# Patient Record
Sex: Female | Born: 1950 | Hispanic: No | Smoking: Former smoker
Health system: Southern US, Community
[De-identification: ages and names within clinical notes are randomized; demographics above are authoritative.]

## PROBLEM LIST (undated history)

## (undated) DIAGNOSIS — N289 Disorder of kidney and ureter, unspecified: Secondary | ICD-10-CM

## (undated) DIAGNOSIS — G4733 Obstructive sleep apnea (adult) (pediatric): Secondary | ICD-10-CM

## (undated) DIAGNOSIS — G8929 Other chronic pain: Secondary | ICD-10-CM

## (undated) DIAGNOSIS — I10 Essential (primary) hypertension: Secondary | ICD-10-CM

## (undated) DIAGNOSIS — Z9989 Dependence on other enabling machines and devices: Secondary | ICD-10-CM

## (undated) DIAGNOSIS — E669 Obesity, unspecified: Secondary | ICD-10-CM

## (undated) DIAGNOSIS — M549 Dorsalgia, unspecified: Secondary | ICD-10-CM

## (undated) DIAGNOSIS — E079 Disorder of thyroid, unspecified: Secondary | ICD-10-CM

## (undated) DIAGNOSIS — R609 Edema, unspecified: Secondary | ICD-10-CM

## (undated) DIAGNOSIS — E7800 Pure hypercholesterolemia, unspecified: Secondary | ICD-10-CM

## (undated) DIAGNOSIS — I509 Heart failure, unspecified: Secondary | ICD-10-CM

## (undated) DIAGNOSIS — I639 Cerebral infarction, unspecified: Secondary | ICD-10-CM

## (undated) HISTORY — PX: OTHER SURGICAL HISTORY: SHX169

## (undated) HISTORY — PX: ABDOMINAL HYSTERECTOMY: SHX81

## (undated) HISTORY — DX: Obstructive sleep apnea (adult) (pediatric): G47.33

## (undated) HISTORY — PX: EYE SURGERY: SHX253

## (undated) HISTORY — DX: Obesity, unspecified: E66.9

## (undated) HISTORY — DX: Dependence on other enabling machines and devices: Z99.89

## (undated) HISTORY — PX: NEPHROLITHOTOMY: SUR881

## (undated) HISTORY — PX: BUNIONECTOMY: SHX129

## (undated) HISTORY — DX: Cerebral infarction, unspecified: I63.9

## (undated) HISTORY — DX: Edema, unspecified: R60.9

---

## 1998-05-08 ENCOUNTER — Encounter: Payer: Self-pay | Admitting: Urology

## 1998-05-08 ENCOUNTER — Ambulatory Visit (HOSPITAL_COMMUNITY): Admission: RE | Admit: 1998-05-08 | Discharge: 1998-05-08 | Payer: Self-pay | Admitting: Urology

## 1998-11-08 ENCOUNTER — Encounter: Admission: RE | Admit: 1998-11-08 | Discharge: 1999-02-06 | Payer: Self-pay | Admitting: Anesthesiology

## 2000-02-16 ENCOUNTER — Encounter: Admission: RE | Admit: 2000-02-16 | Discharge: 2000-04-09 | Payer: Self-pay | Admitting: Anesthesiology

## 2000-06-03 ENCOUNTER — Ambulatory Visit (HOSPITAL_COMMUNITY): Admission: RE | Admit: 2000-06-03 | Discharge: 2000-06-03 | Payer: Self-pay | Admitting: Internal Medicine

## 2000-06-03 ENCOUNTER — Encounter (HOSPITAL_BASED_OUTPATIENT_CLINIC_OR_DEPARTMENT_OTHER): Payer: Self-pay | Admitting: Internal Medicine

## 2000-06-08 ENCOUNTER — Other Ambulatory Visit: Admission: RE | Admit: 2000-06-08 | Discharge: 2000-06-08 | Payer: Self-pay | Admitting: Internal Medicine

## 2000-12-23 ENCOUNTER — Encounter: Admission: RE | Admit: 2000-12-23 | Discharge: 2001-02-23 | Payer: Self-pay | Admitting: Internal Medicine

## 2001-10-19 ENCOUNTER — Ambulatory Visit (HOSPITAL_COMMUNITY): Admission: RE | Admit: 2001-10-19 | Discharge: 2001-10-19 | Payer: Self-pay | Admitting: Internal Medicine

## 2001-10-19 ENCOUNTER — Encounter: Payer: Self-pay | Admitting: Internal Medicine

## 2002-09-14 ENCOUNTER — Ambulatory Visit (HOSPITAL_BASED_OUTPATIENT_CLINIC_OR_DEPARTMENT_OTHER): Admission: RE | Admit: 2002-09-14 | Discharge: 2002-09-14 | Payer: Self-pay | Admitting: Orthopedic Surgery

## 2005-08-22 ENCOUNTER — Emergency Department (HOSPITAL_COMMUNITY): Admission: EM | Admit: 2005-08-22 | Discharge: 2005-08-22 | Payer: Self-pay | Admitting: Emergency Medicine

## 2006-10-08 ENCOUNTER — Ambulatory Visit (HOSPITAL_COMMUNITY): Admission: RE | Admit: 2006-10-08 | Discharge: 2006-10-08 | Payer: Self-pay | Admitting: Internal Medicine

## 2006-10-08 ENCOUNTER — Ambulatory Visit: Payer: Self-pay | Admitting: *Deleted

## 2011-07-22 ENCOUNTER — Other Ambulatory Visit (HOSPITAL_COMMUNITY): Payer: Self-pay | Admitting: Internal Medicine

## 2011-07-22 DIAGNOSIS — Z1231 Encounter for screening mammogram for malignant neoplasm of breast: Secondary | ICD-10-CM

## 2011-07-23 ENCOUNTER — Ambulatory Visit (HOSPITAL_COMMUNITY)
Admission: RE | Admit: 2011-07-23 | Discharge: 2011-07-23 | Disposition: A | Discharge status: Home / Self Care | Payer: BC Managed Care – PPO | Source: Ambulatory Visit | Attending: Internal Medicine | Admitting: Internal Medicine

## 2011-07-23 DIAGNOSIS — Z1231 Encounter for screening mammogram for malignant neoplasm of breast: Secondary | ICD-10-CM

## 2011-09-05 ENCOUNTER — Encounter (HOSPITAL_COMMUNITY): Payer: Self-pay | Admitting: *Deleted

## 2011-09-05 ENCOUNTER — Emergency Department (HOSPITAL_COMMUNITY)
Admission: EM | Admit: 2011-09-05 | Discharge: 2011-09-05 | Disposition: A | Discharge status: Home / Self Care | Payer: BC Managed Care – PPO | Attending: Emergency Medicine | Admitting: Emergency Medicine

## 2011-09-05 ENCOUNTER — Emergency Department (HOSPITAL_COMMUNITY): Payer: BC Managed Care – PPO

## 2011-09-05 DIAGNOSIS — I10 Essential (primary) hypertension: Secondary | ICD-10-CM | POA: Insufficient documentation

## 2011-09-05 DIAGNOSIS — E7800 Pure hypercholesterolemia, unspecified: Secondary | ICD-10-CM | POA: Insufficient documentation

## 2011-09-05 DIAGNOSIS — M549 Dorsalgia, unspecified: Secondary | ICD-10-CM | POA: Insufficient documentation

## 2011-09-05 DIAGNOSIS — M25569 Pain in unspecified knee: Secondary | ICD-10-CM | POA: Insufficient documentation

## 2011-09-05 DIAGNOSIS — M25562 Pain in left knee: Secondary | ICD-10-CM

## 2011-09-05 DIAGNOSIS — I509 Heart failure, unspecified: Secondary | ICD-10-CM | POA: Insufficient documentation

## 2011-09-05 DIAGNOSIS — H6690 Otitis media, unspecified, unspecified ear: Secondary | ICD-10-CM | POA: Insufficient documentation

## 2011-09-05 DIAGNOSIS — M79609 Pain in unspecified limb: Secondary | ICD-10-CM | POA: Insufficient documentation

## 2011-09-05 DIAGNOSIS — Z79899 Other long term (current) drug therapy: Secondary | ICD-10-CM | POA: Insufficient documentation

## 2011-09-05 DIAGNOSIS — E119 Type 2 diabetes mellitus without complications: Secondary | ICD-10-CM | POA: Insufficient documentation

## 2011-09-05 HISTORY — DX: Pure hypercholesterolemia, unspecified: E78.00

## 2011-09-05 HISTORY — DX: Disorder of thyroid, unspecified: E07.9

## 2011-09-05 HISTORY — DX: Dorsalgia, unspecified: M54.9

## 2011-09-05 HISTORY — DX: Heart failure, unspecified: I50.9

## 2011-09-05 HISTORY — DX: Essential (primary) hypertension: I10

## 2011-09-05 HISTORY — DX: Disorder of kidney and ureter, unspecified: N28.9

## 2011-09-05 HISTORY — DX: Other chronic pain: G89.29

## 2011-09-05 MED ORDER — HYDROCODONE-ACETAMINOPHEN 5-325 MG PO TABS
1.0000 | ORAL_TABLET | Freq: Once | ORAL | Status: AC
  Administered 2011-09-05: 1 via ORAL
  Filled 2011-09-05: qty 1

## 2011-09-05 MED ORDER — HYDROCODONE-ACETAMINOPHEN 5-325 MG PO TABS: 1.0000 | ORAL_TABLET | ORAL | Status: AC | PRN

## 2011-09-05 NOTE — ED Provider Notes (Signed)
History     CSN: [id number]  Arrival date & time 09/05/11  1204   First MD Initiated Contact with Patient 09/05/11 1214      Chief Complaint  Patient presents with  . Knee Pain    Left    (Consider location/radiation/quality/duration/timing/severity/associated sxs/prior treatment) HPI Comments: Patient reports she has had left knee pain intermittently x several months that is progressively getting worse, has been constant for the past day.  States that the pain is worst right when she stands up from a seated position.  The pain is aching and throbbing, and the knee feels like it "catches."  Pain radiates into her left thigh.  Has been taking naproxen and mobic at home without improvement.  Denies fevers, injury to the knee, swelling of the knee or legs.  Hx torn meniscus with surgical repair by Dr Chaney Malling in 2004.    The history is provided by the patient.    Past Medical History  Diagnosis Date  . CHF (congestive heart failure)   . Hypertension   . High cholesterol   . Thyroid disease   . Diabetes mellitus   . Back pain, chronic   . Renal disorder     Past Surgical History  Procedure Date  . Nephrolithotomy     Right  . Abdominal hysterectomy   . Eye surgery   . Bunionectomy     both feet  . Left knee surgery     Torn meniscus    History reviewed. No pertinent family history.  History  Substance Use Topics  . Smoking status: Never Smoker   . Smokeless tobacco: Never Used  . Alcohol Use: No    OB History    Grav Para Term Preterm Abortions TAB SAB Ect Mult Living                  Review of Systems  All other systems reviewed and are negative.    Allergies  Lipitor  Home Medications   Current Outpatient Rx  Name Route Sig Dispense Refill  . ALLOPURINOL 300 MG PO TABS Oral Take 300 mg by mouth daily.    Marland Kitchen BACLOFEN 10 MG PO TABS Oral Take 10 mg by mouth daily.    . BUPROPION HCL ER (SR) 100 MG PO TB12 Oral Take 100 mg by mouth 3 (three) times  daily.    . COLESEVELAM HCL 625 MG PO TABS Oral Take 1,875 mg by mouth 2 (two) times daily with a meal.    . CYCLOSPORINE 0.05 % OP EMUL Both Eyes Place 1 drop into both eyes 2 (two) times daily.    Marland Kitchen EXENATIDE 10 MCG/0.04ML [place] SOLN Subcutaneous Inject 10 mcg into the skin 2 (two) times daily with a meal.    . EZETIMIBE 10 MG PO TABS Oral Take 10 mg by mouth daily.    . FUROSEMIDE 40 MG PO TABS Oral Take 120 mg by mouth daily.    Marland Kitchen GABAPENTIN 600 MG PO TABS Oral Take 600 mg by mouth at bedtime.    . GLYBURIDE 5 MG PO TABS Oral Take 5 mg by mouth daily with supper.    Marland Kitchen LEVOTHYROXINE SODIUM 75 MCG PO TABS Oral Take 75 mcg by mouth daily.    . MELOXICAM 15 MG PO TABS Oral Take 15 mg by mouth daily.    . METHYLPHENIDATE HCL ER 36 MG PO TBCR Oral Take 36 mg by mouth every morning.    Marland Kitchen METOLAZONE 2.5 MG PO TABS  Oral Take 2.5 mg by mouth 3 (three) times a week.    . NEBIVOLOL HCL 5 MG PO TABS Oral Take 5 mg by mouth daily.    Marland Kitchen PANTOPRAZOLE SODIUM 40 MG PO TBEC Oral Take 40 mg by mouth daily.    Marland Kitchen POTASSIUM CHLORIDE CRYS ER 20 MEQ PO TBCR Oral Take 20 mEq by mouth 2 (two) times daily. 4 times daily for 4 days of week and 5 times daily for 3 days    . SPIRONOLACTONE 50 MG PO TABS Oral Take 50 mg by mouth daily.    Marland Kitchen VITAMIN D (ERGOCALCIFEROL) 50000 UNITS PO CAPS Oral Take 50,000 Units by mouth 2 (two) times a week.      BP 119/56  Pulse 79  Temp(Src) 98.3 F (36.8 C) (Oral)  Resp 18  Wt 245 lb (111.131 kg)  SpO2 98%  Physical Exam  Constitutional: She is oriented to person, place, and time. She appears well-developed and well-nourished.  HENT:  Head: Normocephalic and atraumatic.  Neck: Neck supple.  Pulmonary/Chest: Effort normal.  Musculoskeletal:       Left knee: She exhibits LCL laxity. She exhibits no swelling, no ecchymosis, no deformity and no erythema.       Pain with varus stress of knee, pain with grind test.    Neurological: She is alert and oriented to person, place, and  time.    ED Course  Procedures (including critical care time)  Labs Reviewed - No data to display Dg Knee Complete 4 Views Left  09/05/2011  *RADIOLOGY REPORT*  Clinical Data: Left knee pain, no recent injury  LEFT KNEE - COMPLETE 4+ VIEW  Comparison: None.  Findings: Mild to moderate degenerative changes, most prominent in the medial compartment.  No fracture or dislocation is seen.  The visualized soft tissues are unremarkable.  No suprapatellar knee joint effusion.  IMPRESSION: Mild to moderate degenerative changes, most prominent in the medial compartment.  Original Report Authenticated By: Charline Bills, M.D.     1. Pain in left knee       MDM  Patient with history of torn meniscus and left knee presents with several months of left knee pain.  That was previously intermittent and is now constant.  Patient had no known injury.  On x-ray.  Patient does have mild to moderate degenerative change.  On my exam.  It appears she may have some meniscal injury, possibly, lateral collateral ligament injury.  There is no evidence of inflammatory or infectious changes.  The knee is not erythematous, edematous, or warm.  The patient does not have fever.  Given the duration of the symptoms and no known injury, this appears to be a chronic issue and not an acute injury.  Patient was discharged home with pain medication and orthopedic followup.  Patient verbalizes understanding and agrees with plan. Rise Patience, [place] 09/05/11 1439

## 2011-09-05 NOTE — ED Notes (Signed)
Pt from home c/o left knee pain off and on for 3 weeks but pain worsened yesterday to the point that pt is unable to bear weight on knee. Pt denies recent injury, has hx of torn meniscus in 2004 (Dr. Chaney Malling).

## 2011-09-05 NOTE — ED Notes (Signed)
PA at bedside.

## 2011-09-05 NOTE — ED Provider Notes (Signed)
Medical screening examination/treatment/procedure(s) were conducted as a shared visit with non-physician practitioner(s) and myself.  I personally evaluated the patient during the encounter   Shaquasha Gerstel, MD 09/05/11 1544 

## 2011-12-22 ENCOUNTER — Other Ambulatory Visit: Payer: Self-pay | Admitting: Cardiovascular Disease

## 2011-12-22 ENCOUNTER — Ambulatory Visit
Admission: RE | Admit: 2011-12-22 | Discharge: 2011-12-22 | Disposition: A | Discharge status: Home / Self Care | Payer: BC Managed Care – PPO | Source: Ambulatory Visit | Attending: Cardiovascular Disease | Admitting: Cardiovascular Disease

## 2011-12-22 DIAGNOSIS — R0602 Shortness of breath: Secondary | ICD-10-CM

## 2011-12-24 HISTORY — PX: US ECHOCARDIOGRAPHY: HXRAD669

## 2012-11-29 ENCOUNTER — Other Ambulatory Visit (HOSPITAL_COMMUNITY): Payer: Self-pay | Admitting: Cardiovascular Disease

## 2012-11-29 DIAGNOSIS — R0602 Shortness of breath: Secondary | ICD-10-CM

## 2012-12-02 ENCOUNTER — Ambulatory Visit (HOSPITAL_COMMUNITY)
Admission: RE | Admit: 2012-12-02 | Discharge: 2012-12-02 | Disposition: A | Discharge status: Home / Self Care | Payer: BC Managed Care – PPO | Source: Ambulatory Visit | Attending: Cardiovascular Disease | Admitting: Cardiovascular Disease

## 2012-12-02 DIAGNOSIS — I10 Essential (primary) hypertension: Secondary | ICD-10-CM | POA: Insufficient documentation

## 2012-12-02 DIAGNOSIS — R0602 Shortness of breath: Secondary | ICD-10-CM | POA: Insufficient documentation

## 2012-12-02 DIAGNOSIS — R5381 Other malaise: Secondary | ICD-10-CM | POA: Insufficient documentation

## 2012-12-02 DIAGNOSIS — Z87891 Personal history of nicotine dependence: Secondary | ICD-10-CM | POA: Insufficient documentation

## 2012-12-02 DIAGNOSIS — E119 Type 2 diabetes mellitus without complications: Secondary | ICD-10-CM | POA: Insufficient documentation

## 2012-12-02 DIAGNOSIS — R079 Chest pain, unspecified: Secondary | ICD-10-CM | POA: Insufficient documentation

## 2012-12-02 DIAGNOSIS — R42 Dizziness and giddiness: Secondary | ICD-10-CM | POA: Insufficient documentation

## 2012-12-02 DIAGNOSIS — Z8249 Family history of ischemic heart disease and other diseases of the circulatory system: Secondary | ICD-10-CM | POA: Insufficient documentation

## 2012-12-02 DIAGNOSIS — R0609 Other forms of dyspnea: Secondary | ICD-10-CM | POA: Insufficient documentation

## 2012-12-02 DIAGNOSIS — R0989 Other specified symptoms and signs involving the circulatory and respiratory systems: Secondary | ICD-10-CM | POA: Insufficient documentation

## 2012-12-02 DIAGNOSIS — R5383 Other fatigue: Secondary | ICD-10-CM | POA: Insufficient documentation

## 2012-12-02 MED ORDER — REGADENOSON 0.4 MG/5ML IV SOLN
0.4000 mg | Freq: Once | INTRAVENOUS | Status: AC
  Administered 2012-12-02: 0.4 mg via INTRAVENOUS

## 2012-12-02 MED ORDER — TECHNETIUM TC 99M SESTAMIBI GENERIC - CARDIOLITE
30.0000 | Freq: Once | INTRAVENOUS | Status: AC | PRN
  Administered 2012-12-02: 30 via INTRAVENOUS

## 2012-12-02 MED ORDER — TECHNETIUM TC 99M SESTAMIBI GENERIC - CARDIOLITE
10.0000 | Freq: Once | INTRAVENOUS | Status: AC | PRN
  Administered 2012-12-02: 10 via INTRAVENOUS

## 2012-12-02 NOTE — Procedures (Addendum)
[facility] [address] [place] [place] [address] [phone]  Cardiology Nuclear Med Study  Bradyn Arbuthnot is a 62 y.o. female     MRN : [id number]     DOB: 1950/07/31  Procedure Date: 12/02/2012  Nuclear Med Background Indication for Stress Test:  Evaluation for Ischemia History:  CHF Cardiac Risk Factors: Family History - CAD, History of Smoking, Hypertension, Lipids and NIDDM  Symptoms:  Chest Pain, DOE, Fatigue, Light-Headedness and SOB   Nuclear Pre-Procedure Caffeine/Decaff Intake:  8:00pm NPO After: 6:00am   IV Site: R Forearm  IV 0.9% NS with Angio Cath:  22g  Chest Size (in):  N/A IV Started by: Emmit Pomfret, RN  Height: 5\' 5"  (1.651 m)  Cup Size: D  BMI:  Body mass index is 41.1 kg/(m^2). Weight:  247 lb (112.038 kg)   Tech Comments:  N/A    Nuclear Med Study 1 or 2 day study: 1 day  Stress Test Type:  Lexiscan  Order Authorizing Provider:  Thurmon Fair, MD   Resting Radionuclide: Technetium 35m Sestamibi  Resting Radionuclide Dose: 10.0 mCi   Stress Radionuclide:  Technetium 90m Sestamibi  Stress Radionuclide Dose: 30.0 mCi           Stress Protocol Rest HR: 58 Stress HR: 85  Rest BP: 139/67 Stress BP: 141/54  Exercise Time (min): n/a METS: n/a   Predicted Max HR: 161 bpm % Max HR: 52.8 bpm Rate Pressure Product: 21308  Dose of Adenosine (mg):  n/a Dose of Lexiscan: 0.4 mg  Dose of Atropine (mg): n/a Dose of Dobutamine: n/a mcg/kg/min (at max HR)  Stress Test Technologist: Esperanza Sheets, CCT Nuclear Technologist: Koren Shiver, CNMT   Rest Procedure:  Myocardial perfusion imaging was performed at rest 45 minutes following the intravenous administration of Technetium 59m Sestamibi. Stress Procedure:  The patient received IV Lexiscan 0.4 mg over 15-seconds.  Technetium 39m Sestamibi injected at 30-seconds.  There were no significant changes with Lexiscan.  Quantitative spect images were obtained  after a 45 minute delay.  Transient Ischemic Dilatation (Normal <1.22):  1.05 Lung/Heart Ratio (Normal <0.45):  0.53 QGS EDV:  49 ml QGS ESV:  13 ml LV Ejection Fraction: 73%  Signed by      Rest ECG: NSR - Normal EKG  Stress ECG: No significant change from baseline ECG  QPS Raw Data Images:  There is no interference from nuclear activity from structures below the diaphragm.   Stress Images:  There is decreased uptake in the anterior wall. Rest Images:  There is decreased uptake in the anterior wall. Subtraction (SDS):  No evidence of ischemia.  Impression Exercise Capacity:  Lexiscan with no exercise. BP Response:  Normal blood pressure response. Clinical Symptoms:  No significant symptoms noted. ECG Impression:  No significant ST segment change suggestive of ischemia. Comparison with Prior Nuclear Study: No previous nuclear study performed  Overall Impression:  Low risk stress nuclear study with breast attenuation artifact.  LV Wall Motion:  NL LV Function; NL Wall Motion   Runell Gess, MD  12/02/2012 5:19 PM

## 2012-12-06 ENCOUNTER — Telehealth: Payer: Self-pay | Admitting: *Deleted

## 2012-12-06 NOTE — Telephone Encounter (Signed)
Pt. returned call verifying test was normal. Requested copy of Nuc  to be mailed.

## 2012-12-06 NOTE — Telephone Encounter (Signed)
LMOM - notified of normal Myoview

## 2012-12-06 NOTE — Telephone Encounter (Signed)
Message copied by Vita Barley on Tue Dec 06, 2012  1:59 PM ------      Message from: Thurmon Fair      Created: Sat Dec 03, 2012  8:25 AM       Normal study, please let the patient know.       ------

## 2012-12-12 ENCOUNTER — Telehealth: Payer: Self-pay | Admitting: *Deleted

## 2012-12-12 NOTE — Telephone Encounter (Signed)
LMOM at work # with normal nuc results

## 2012-12-18 ENCOUNTER — Encounter: Payer: Self-pay | Admitting: *Deleted

## 2013-01-10 ENCOUNTER — Encounter: Payer: Self-pay | Admitting: Cardiovascular Disease

## 2013-10-22 ENCOUNTER — Emergency Department (HOSPITAL_COMMUNITY): Payer: BC Managed Care – PPO

## 2013-10-22 ENCOUNTER — Encounter (HOSPITAL_COMMUNITY): Payer: Self-pay | Admitting: Emergency Medicine

## 2013-10-22 ENCOUNTER — Emergency Department (HOSPITAL_COMMUNITY)
Admission: EM | Admit: 2013-10-22 | Discharge: 2013-10-23 | Disposition: A | Discharge status: Home / Self Care | Payer: BC Managed Care – PPO | Attending: Emergency Medicine | Admitting: Emergency Medicine

## 2013-10-22 DIAGNOSIS — I10 Essential (primary) hypertension: Secondary | ICD-10-CM | POA: Insufficient documentation

## 2013-10-22 DIAGNOSIS — Z9981 Dependence on supplemental oxygen: Secondary | ICD-10-CM | POA: Insufficient documentation

## 2013-10-22 DIAGNOSIS — E7800 Pure hypercholesterolemia, unspecified: Secondary | ICD-10-CM | POA: Insufficient documentation

## 2013-10-22 DIAGNOSIS — Z791 Long term (current) use of non-steroidal anti-inflammatories (NSAID): Secondary | ICD-10-CM | POA: Insufficient documentation

## 2013-10-22 DIAGNOSIS — I509 Heart failure, unspecified: Secondary | ICD-10-CM | POA: Insufficient documentation

## 2013-10-22 DIAGNOSIS — Z87891 Personal history of nicotine dependence: Secondary | ICD-10-CM | POA: Insufficient documentation

## 2013-10-22 DIAGNOSIS — Z90710 Acquired absence of both cervix and uterus: Secondary | ICD-10-CM | POA: Insufficient documentation

## 2013-10-22 DIAGNOSIS — Z87442 Personal history of urinary calculi: Secondary | ICD-10-CM | POA: Insufficient documentation

## 2013-10-22 DIAGNOSIS — G4733 Obstructive sleep apnea (adult) (pediatric): Secondary | ICD-10-CM | POA: Insufficient documentation

## 2013-10-22 DIAGNOSIS — R609 Edema, unspecified: Secondary | ICD-10-CM | POA: Insufficient documentation

## 2013-10-22 DIAGNOSIS — E079 Disorder of thyroid, unspecified: Secondary | ICD-10-CM | POA: Insufficient documentation

## 2013-10-22 DIAGNOSIS — N83209 Unspecified ovarian cyst, unspecified side: Secondary | ICD-10-CM

## 2013-10-22 DIAGNOSIS — G8929 Other chronic pain: Secondary | ICD-10-CM | POA: Insufficient documentation

## 2013-10-22 DIAGNOSIS — E669 Obesity, unspecified: Secondary | ICD-10-CM | POA: Insufficient documentation

## 2013-10-22 DIAGNOSIS — Z79899 Other long term (current) drug therapy: Secondary | ICD-10-CM | POA: Insufficient documentation

## 2013-10-22 DIAGNOSIS — E119 Type 2 diabetes mellitus without complications: Secondary | ICD-10-CM | POA: Insufficient documentation

## 2013-10-22 LAB — BASIC METABOLIC PANEL
BUN: 17 mg/dL (ref 6–23)
CALCIUM: 9.8 mg/dL (ref 8.4–10.5)
CO2: 25 mEq/L (ref 19–32)
CREATININE: 1.17 mg/dL — AB (ref 0.50–1.10)
Chloride: 96 mEq/L (ref 96–112)
GFR, EST AFRICAN AMERICAN: 57 mL/min — AB (ref >90)
GFR, EST NON AFRICAN AMERICAN: 50 mL/min — AB (ref >90)
Glucose, Bld: 357 mg/dL — ABNORMAL HIGH (ref 70–99)
Potassium: 4.8 mEq/L (ref 3.7–5.3)
Sodium: 135 mEq/L — ABNORMAL LOW (ref 137–147)

## 2013-10-22 LAB — URINALYSIS, ROUTINE W REFLEX MICROSCOPIC
Bilirubin Urine: NEGATIVE
Glucose, UA: >1000 mg/dL — AB
Hgb urine dipstick: NEGATIVE
KETONES UR: NEGATIVE mg/dL
LEUKOCYTES UA: NEGATIVE
NITRITE: NEGATIVE
PROTEIN: NEGATIVE mg/dL
Specific Gravity, Urine: 1.039 — ABNORMAL HIGH (ref 1.005–1.030)
UROBILINOGEN UA: 0.2 mg/dL (ref 0.0–1.0)
pH: 6 (ref 5.0–8.0)

## 2013-10-22 LAB — URINE MICROSCOPIC-ADD ON

## 2013-10-22 LAB — CBC WITH DIFFERENTIAL/PLATELET
BASOS PCT: 0 % (ref 0–1)
Basophils Absolute: 0 10*3/uL (ref 0.0–0.1)
Eosinophils Absolute: 0.4 10*3/uL (ref 0.0–0.7)
Eosinophils Relative: 5 % (ref 0–5)
HEMATOCRIT: 42.4 % (ref 36.0–46.0)
HEMOGLOBIN: 14.3 g/dL (ref 12.0–15.0)
Lymphocytes Relative: 26 % (ref 12–46)
Lymphs Abs: 2.1 10*3/uL (ref 0.7–4.0)
MCH: 32.2 pg (ref 26.0–34.0)
MCHC: 33.7 g/dL (ref 30.0–36.0)
MCV: 95.5 fL (ref 78.0–100.0)
MONO ABS: 0.6 10*3/uL (ref 0.1–1.0)
MONOS PCT: 7 % (ref 3–12)
NEUTROS ABS: 5 10*3/uL (ref 1.7–7.7)
Neutrophils Relative %: 62 % (ref 43–77)
Platelets: 285 10*3/uL (ref 150–400)
RBC: 4.44 MIL/uL (ref 3.87–5.11)
RDW: 13.4 % (ref 11.5–15.5)
WBC: 8.1 10*3/uL (ref 4.0–10.5)

## 2013-10-22 MED ORDER — HYDROMORPHONE HCL PF 1 MG/ML IJ SOLN
1.0000 mg | Freq: Once | INTRAMUSCULAR | Status: AC
  Administered 2013-10-22: 1 mg via INTRAVENOUS
  Filled 2013-10-22: qty 1

## 2013-10-22 MED ORDER — SODIUM CHLORIDE 0.9 % IV BOLUS (SEPSIS)
1000.0000 mL | Freq: Once | INTRAVENOUS | Status: AC
  Administered 2013-10-22: 1000 mL via INTRAVENOUS

## 2013-10-22 NOTE — ED Provider Notes (Signed)
CSN: [id number]     Arrival date & time 10/22/13  1939 History   First MD Initiated Contact with Patient 10/22/13 2029     Chief Complaint  Patient presents with  . Flank Pain     (Consider location/radiation/quality/duration/timing/severity/associated sxs/prior Treatment) HPI Comments: Patient presents to the emergency department with chief complaint of right-sided flank pain. She states the pain began yesterday morning. She reports that she has a history of kidney stones. She states the pain is 10 out of 10. She is tried taking Percocet with mild relief. She denies any fevers, chills, dysuria, or hematuria. She states that she has had to have surgery for previous kidney stones. She is followed by Dr. Kellie Simmering.  There are no aggravating or alleviating factors.  The history is provided by the patient. No language interpreter was used.    Past Medical History  Diagnosis Date  . CHF (congestive heart failure)   . Hypertension   . High cholesterol   . Thyroid disease   . Diabetes mellitus   . Back pain, chronic   . Renal disorder   . Obesity   . OSA on CPAP   . Edema    Past Surgical History  Procedure Laterality Date  . Nephrolithotomy      Right  . Abdominal hysterectomy    . Eye surgery    . Bunionectomy      both feet  . Left knee surgery      Torn meniscus  . US echocardiography  12/24/2011    mild LVH,mild TR,trace MR,PI   Family History  Problem Relation Age of Onset  . Diabetes Mother   . Hyperlipidemia Mother   . Hypertension Mother   . Hypertension Father   . Diabetes Father   . Cancer Father   . Heart attack Brother   . Hyperlipidemia Brother   . Hypertension Brother    History  Substance Use Topics  . Smoking status: Former Smoker    Quit date: 07/26/2002  . Smokeless tobacco: Never Used  . Alcohol Use: No   OB History   Grav Para Term Preterm Abortions TAB SAB Ect Mult Living                 Review of Systems  Constitutional: Negative for fever  and chills.  Respiratory: Negative for shortness of breath.   Cardiovascular: Negative for chest pain.  Gastrointestinal: Negative for nausea, vomiting, diarrhea and constipation.  Genitourinary: Positive for flank pain. Negative for dysuria.      Allergies  Lipitor  Home Medications   Current Outpatient Rx  Name  Route  Sig  Dispense  Refill  . allopurinol (ZYLOPRIM) 300 MG tablet   Oral   Take 300 mg by mouth daily.         . baclofen (LIORESAL) 10 MG tablet   Oral   Take 10 mg by mouth daily.         Marland Kitchen buPROPion (WELLBUTRIN SR) 100 MG 12 hr tablet   Oral   Take 100 mg by mouth 3 (three) times daily.         . colesevelam (WELCHOL) 625 MG tablet   Oral   Take 1,875 mg by mouth 2 (two) times daily with a meal.         . cycloSPORINE (RESTASIS) 0.05 % ophthalmic emulsion   Both Eyes   Place 1 drop into both eyes 2 (two) times daily.         Marland Kitchen  exenatide (BYETTA) 10 MCG/0.04ML SOLN   Subcutaneous   Inject 10 mcg into the skin 2 (two) times daily with a meal.         . ezetimibe (ZETIA) 10 MG tablet   Oral   Take 10 mg by mouth daily.         . furosemide (LASIX) 40 MG tablet   Oral   Take 120 mg by mouth daily.         Marland Kitchen gabapentin (NEURONTIN) 600 MG tablet   Oral   Take 600 mg by mouth at bedtime.         Marland Kitchen glyBURIDE (DIABETA) 5 MG tablet   Oral   Take 5 mg by mouth daily with supper.         . levothyroxine (SYNTHROID, LEVOTHROID) 75 MCG tablet   Oral   Take 75 mcg by mouth daily.         . meloxicam (MOBIC) 15 MG tablet   Oral   Take 15 mg by mouth daily.         . methylphenidate (CONCERTA) 36 MG CR tablet   Oral   Take 36 mg by mouth every morning.         . metolazone (ZAROXOLYN) 2.5 MG tablet   Oral   Take 2.5 mg by mouth 3 (three) times a week.         . nebivolol (BYSTOLIC) 5 MG tablet   Oral   Take 5 mg by mouth daily.         Marland Kitchen oxybutynin (OXYTROL) 3.9 MG/24HR   Transdermal   Place 1 patch onto the  skin 2 (two) times a week.         . pantoprazole (PROTONIX) 40 MG tablet   Oral   Take 40 mg by mouth daily.         . potassium chloride SA (K-DUR,KLOR-CON) 20 MEQ tablet   Oral   Take 20 mEq by mouth as directed. 36meq QID on MWF & 23meq five times a day on all other days.         . promethazine (PHENERGAN) 25 MG tablet   Oral   Take 25 mg by mouth every 6 (six) hours as needed for nausea.         Marland Kitchen spironolactone (ALDACTONE) 50 MG tablet   Oral   Take 50 mg by mouth daily.         . temazepam (RESTORIL) 30 MG capsule   Oral   Take 30 mg by mouth at bedtime as needed for sleep.         . Vitamin D, Ergocalciferol, (DRISDOL) 50000 UNITS CAPS   Oral   Take 50,000 Units by mouth 2 (two) times a week.          BP 124/60  Pulse 86  Temp(Src) 98.4 F (36.9 C) (Oral)  Resp 16  Ht 5\' 5"  (1.651 m)  Wt 246 lb (111.585 kg)  BMI 40.94 kg/m2  SpO2 92% Physical Exam  Nursing note and vitals reviewed. Constitutional: She is oriented to person, place, and time. She appears well-developed and well-nourished.  HENT:  Head: Normocephalic and atraumatic.  Eyes: Conjunctivae and EOM are normal. Pupils are equal, round, and reactive to light.  Neck: Normal range of motion. Neck supple.  Cardiovascular: Normal rate and regular rhythm.  Exam reveals no gallop and no friction rub.   No murmur heard. Pulmonary/Chest: Effort normal and breath sounds normal. No respiratory distress. She  has no wheezes. She has no rales. She exhibits no tenderness.  Abdominal: Soft. Bowel sounds are normal. She exhibits no distension and no mass. There is no tenderness. There is no rebound and no guarding.  No focal abdominal tenderness, no RLQ tenderness or pain at McBurney's point, no RUQ tenderness or Murphy's sign, no left-sided abdominal tenderness, no fluid wave, or signs of peritonitis   Musculoskeletal: Normal range of motion. She exhibits no edema and no tenderness.  Neurological: She  is alert and oriented to person, place, and time.  Skin: Skin is warm and dry.  Psychiatric: She has a normal mood and affect. Her behavior is normal. Judgment and thought content normal.    ED Course  Procedures (including critical care time) Results for orders placed during the hospital encounter of 10/22/13  CBC WITH DIFFERENTIAL      Result Value Ref Range   WBC 8.1  4.0 - 10.5 K/uL   RBC 4.44  3.87 - 5.11 MIL/uL   Hemoglobin 14.3  12.0 - 15.0 g/dL   HCT 42.4  36.0 - 46.0 %   MCV 95.5  78.0 - 100.0 fL   MCH 32.2  26.0 - 34.0 pg   MCHC 33.7  30.0 - 36.0 g/dL   RDW 13.4  11.5 - 15.5 %   Platelets 285  150 - 400 K/uL   Neutrophils Relative % 62  43 - 77 %   Neutro Abs 5.0  1.7 - 7.7 K/uL   Lymphocytes Relative 26  12 - 46 %   Lymphs Abs 2.1  0.7 - 4.0 K/uL   Monocytes Relative 7  3 - 12 %   Monocytes Absolute 0.6  0.1 - 1.0 K/uL   Eosinophils Relative 5  0 - 5 %   Eosinophils Absolute 0.4  0.0 - 0.7 K/uL   Basophils Relative 0  0 - 1 %   Basophils Absolute 0.0  0.0 - 0.1 K/uL  BASIC METABOLIC PANEL      Result Value Ref Range   Sodium 135 (*) 137 - 147 mEq/L   Potassium 4.8  3.7 - 5.3 mEq/L   Chloride 96  96 - 112 mEq/L   CO2 25  19 - 32 mEq/L   Glucose, Bld 357 (*) 70 - 99 mg/dL   BUN 17  6 - 23 mg/dL   Creatinine, Ser 1.17 (*) 0.50 - 1.10 mg/dL   Calcium 9.8  8.4 - 10.5 mg/dL   GFR calc non Af Amer 50 (*) >90 mL/min   GFR calc Af Amer 57 (*) >90 mL/min  URINALYSIS, ROUTINE W REFLEX MICROSCOPIC      Result Value Ref Range   Color, Urine YELLOW  YELLOW   APPearance CLEAR  CLEAR   Specific Gravity, Urine 1.039 (*) 1.005 - 1.030   pH 6.0  5.0 - 8.0   Glucose, UA >1000 (*) NEGATIVE mg/dL   Hgb urine dipstick NEGATIVE  NEGATIVE   Bilirubin Urine NEGATIVE  NEGATIVE   Ketones, ur NEGATIVE  NEGATIVE mg/dL   Protein, ur NEGATIVE  NEGATIVE mg/dL   Urobilinogen, UA 0.2  0.0 - 1.0 mg/dL   Nitrite NEGATIVE  NEGATIVE   Leukocytes, UA NEGATIVE  NEGATIVE  URINE  MICROSCOPIC-ADD ON      Result Value Ref Range   Squamous Epithelial / LPF FEW (*) RARE   WBC, UA 3-6  <3 WBC/hpf   RBC / HPF 0-2  <3 RBC/hpf   Urine-Other FEW YEAST    CBG MONITORING,  ED      Result Value Ref Range   Glucose-Capillary 352 (*) 70 - 99 mg/dL   Comment 1 Notify RN     Ct Abdomen Pelvis Wo Contrast  10/22/2013   CLINICAL DATA:  Right flank pain.  EXAM: CT ABDOMEN AND PELVIS WITHOUT CONTRAST  TECHNIQUE: Multidetector CT imaging of the abdomen and pelvis was performed following the standard protocol without intravenous contrast.  COMPARISON:  CT, 11/11/2010  FINDINGS: Discoid atelectasis of the right lung base. Lung bases are otherwise clear.  Heterogeneous fatty infiltration of the liver. No liver mass or focal lesion.  Normal spleen, gallbladder and pancreas. No bile duct dilation. No adrenal masses.  There are bilateral nonobstructing intrarenal stones. No renal masses. No hydronephrosis. Ureters are normal course and in caliber with no stones. Bladder is unremarkable.  There is a cystic mass lying above the vaginal cuff. The uterus is surgically absent. The mass measures 7.7 cm x 7 cm x 7.7 cm. This may be a peritoneal inclusion cyst. It could reflect an ovarian cystic mass. It is new from the prior CT.  No pathologically enlarged lymph nodes. There are no abnormal fluid collections.  The bowel is unremarkable.  A normal appendix is visualized.  There are degenerative changes of the visualized spine. No osteoblastic or osteolytic lesions. Atherosclerotic calcifications are noted along the course of a normal caliber abdominal aorta and iliac arteries.  IMPRESSION: 1. 7.7 cm cystic mass in the pelvis above the vaginal cuff. This was not present on the previous CT. It could be ovarian in origin. It could reflect a peritoneal inclusion cyst. There is no associated inflammatory change. 2. Bilateral nonobstructing intrarenal stones. No ureteral stone. No obstructive uropathy. 3. Diffuse hepatic  steatosis. 4. No acute findings.  Normal appendix is visualized.   Electronically Signed   By: Lajean Manes M.D.   On: 10/22/2013 21:29   US Transvaginal Non-ob  10/22/2013   ADDENDUM REPORT: 10/22/2013 23:45  ADDENDUM: Vascular flow can be identified within the small amount of normal right ovarian tissue on color Doppler imaging.   Electronically Signed   By: Jacqulynn Cadet M.D.   On: 10/22/2013 23:45   10/22/2013   CLINICAL DATA:  Right flank pain, kidney stones  EXAM: TRANSABDOMINAL AND TRANSVAGINAL ULTRASOUND OF PELVIS  TECHNIQUE: Both transabdominal and transvaginal ultrasound examinations of the pelvis were performed. Transabdominal technique was performed for global imaging of the pelvis including uterus, ovaries, adnexal regions, and pelvic cul-de-sac. It was necessary to proceed with endovaginal exam following the transabdominal exam to visualize the right ovary.  COMPARISON:  CT scan abdomen and pelvis performed earlier today ; prior CT scan of 11/11/2010.  FINDINGS: Uterus  Surgically absent.  Endometrium  Surgically absent.  Right ovary  Measurements: 7.9 x 5.2 x 7.9 cm. There is a dominant 7.8 x 5.2 x 7.0 cm sonographically simple cyst arising from the right ovary. No evidence of a mural nodularity or internal vascularity.  Left ovary  Measurements: 1.9 x 1.8 x 1.8 cm. Normal appearance/no adnexal mass.  Other findings  No free fluid.  IMPRESSION: A 7.8 cm sonographically simple cystic mass appears to arise from the right ovary. Of note, this abnormality was not present on the CT from 11/11/2010.  Differential considerations include interval development of a peritoneal inclusion cyst and a developing ovarian cystic mass. Cysts of this size may be difficult to assess completely with [place], further evaluation of simple-appearing cysts >7 cm with MRI or surgical evaluation is  recommended according to the Society of Radiologists in Ultrasound 2010 Consensus Conference Statement (D Janae Bridgeman al.  Management of Asymptomatic Ovarian and other Adnexal Cysts Imaged at [place]: Society of Radiologists in [place] Statement 2010. Radiology 256 (Sept 2010): [id number].).  Electronically Signed: By: Jacqulynn Cadet M.D. On: 10/22/2013 23:34   US Pelvis Complete  10/22/2013   ADDENDUM REPORT: 10/22/2013 23:45  ADDENDUM: Vascular flow can be identified within the small amount of normal right ovarian tissue on color Doppler imaging.   Electronically Signed   By: Jacqulynn Cadet M.D.   On: 10/22/2013 23:45   10/22/2013   CLINICAL DATA:  Right flank pain, kidney stones  EXAM: TRANSABDOMINAL AND TRANSVAGINAL ULTRASOUND OF PELVIS  TECHNIQUE: Both transabdominal and transvaginal ultrasound examinations of the pelvis were performed. Transabdominal technique was performed for global imaging of the pelvis including uterus, ovaries, adnexal regions, and pelvic cul-de-sac. It was necessary to proceed with endovaginal exam following the transabdominal exam to visualize the right ovary.  COMPARISON:  CT scan abdomen and pelvis performed earlier today ; prior CT scan of 11/11/2010.  FINDINGS: Uterus  Surgically absent.  Endometrium  Surgically absent.  Right ovary  Measurements: 7.9 x 5.2 x 7.9 cm. There is a dominant 7.8 x 5.2 x 7.0 cm sonographically simple cyst arising from the right ovary. No evidence of a mural nodularity or internal vascularity.  Left ovary  Measurements: 1.9 x 1.8 x 1.8 cm. Normal appearance/no adnexal mass.  Other findings  No free fluid.  IMPRESSION: A 7.8 cm sonographically simple cystic mass appears to arise from the right ovary. Of note, this abnormality was not present on the CT from 11/11/2010.  Differential considerations include interval development of a peritoneal inclusion cyst and a developing ovarian cystic mass. Cysts of this size may be difficult to assess completely with [place], further evaluation of simple-appearing cysts >7 cm with MRI or surgical evaluation is  recommended according to the Society of Radiologists in Ultrasound 2010 Consensus Conference Statement (D Clovis Riley et al. Management of Asymptomatic Ovarian and other Adnexal Cysts Imaged at [place]: Society of Radiologists in [place] Statement 2010. Radiology 256 (Sept 2010): [id number].).  Electronically Signed: By: Jacqulynn Cadet M.D. On: 10/22/2013 23:34      EKG Interpretation None      MDM   Final diagnoses:  Ovarian cyst    Patient with right-sided flank pain. History of kidney stones. Has had to have surgery in the past. We'll get a CT scan, check labs, and urinalysis. Will treat pain.  CT scan shows cystic mass in RLQ, will order [place] per Dr. Vevelyn Francois recommendation.  Patient's pain is significantly improved after dilaudid.  12:04 AM Discussed the US findings with Dr. Raphael Gibney, who is the patient's OBGYN.  Dr. Raphael Gibney recommends no further workup tonight, but recommends that the patient follow-up in the office.  Discussed this with Dr. Wyvonnia Dusky, who agrees with the plan.  Will discharge.  Patient is not acidotic.  Continue meds at home.  Montine Circle, PA-C 10/23/13 [phone]

## 2013-10-22 NOTE — ED Notes (Signed)
Pt c/o R flank pain onset yesterday, waves of pain per pt. Pt states she has taken several of her Oxycodone since yesterday now pain has returned, pt tearful in triage. Pt does have kidney stone hx.

## 2013-10-23 LAB — CBG MONITORING, ED: Glucose-Capillary: 352 mg/dL — ABNORMAL HIGH (ref 70–99)

## 2013-10-23 MED ORDER — HYDROCODONE-ACETAMINOPHEN 5-325 MG PO TABS: 1.0000 | ORAL_TABLET | Freq: Four times a day (QID) | ORAL | Status: DC | PRN

## 2013-10-23 NOTE — ED Provider Notes (Signed)
Medical screening examination/treatment/procedure(s) were conducted as a shared visit with non-physician practitioner(s) and myself.  I personally evaluated the patient during the encounter.  R flank pain without urinary symptoms.  No vomiting or fever.  No abdominal pain. Abdomen soft and nontender.  R paraspinal lumbar pain.  UA negative.  Ovarian mass d/w radiology and GYN. No evidence of torsion.    EKG Interpretation None       Ezequiel Essex, MD 10/23/13 [phone]

## 2013-10-23 NOTE — Discharge Instructions (Signed)

## 2013-10-26 ENCOUNTER — Other Ambulatory Visit: Payer: Self-pay | Admitting: Obstetrics and Gynecology

## 2013-10-31 ENCOUNTER — Encounter (HOSPITAL_COMMUNITY): Payer: Self-pay | Admitting: Pharmacist

## 2013-10-31 ENCOUNTER — Ambulatory Visit (INDEPENDENT_AMBULATORY_CARE_PROVIDER_SITE_OTHER): Payer: BC Managed Care – PPO | Admitting: Internal Medicine

## 2013-10-31 ENCOUNTER — Inpatient Hospital Stay (HOSPITAL_COMMUNITY): Payer: BC Managed Care – PPO

## 2013-10-31 ENCOUNTER — Encounter: Payer: Self-pay | Admitting: Internal Medicine

## 2013-10-31 ENCOUNTER — Observation Stay (HOSPITAL_COMMUNITY)
Admission: AD | Admit: 2013-10-31 | Discharge: 2013-11-02 | Disposition: A | Discharge status: Home / Self Care | Payer: BC Managed Care – PPO | Source: Ambulatory Visit | Attending: Obstetrics and Gynecology | Admitting: Obstetrics and Gynecology

## 2013-10-31 ENCOUNTER — Encounter (HOSPITAL_COMMUNITY): Payer: Self-pay

## 2013-10-31 VITALS — BP 158/70 | HR 72 | Ht 65.0 in | Wt 259.9 lb

## 2013-10-31 DIAGNOSIS — R1900 Intra-abdominal and pelvic swelling, mass and lump, unspecified site: Secondary | ICD-10-CM | POA: Diagnosis present

## 2013-10-31 DIAGNOSIS — R609 Edema, unspecified: Secondary | ICD-10-CM

## 2013-10-31 DIAGNOSIS — Z794 Long term (current) use of insulin: Principal | ICD-10-CM

## 2013-10-31 DIAGNOSIS — IMO0002 Reserved for concepts with insufficient information to code with codable children: Secondary | ICD-10-CM

## 2013-10-31 DIAGNOSIS — G4733 Obstructive sleep apnea (adult) (pediatric): Secondary | ICD-10-CM | POA: Insufficient documentation

## 2013-10-31 DIAGNOSIS — IMO0001 Reserved for inherently not codable concepts without codable children: Secondary | ICD-10-CM

## 2013-10-31 DIAGNOSIS — Z809 Family history of malignant neoplasm, unspecified: Secondary | ICD-10-CM | POA: Insufficient documentation

## 2013-10-31 DIAGNOSIS — Z01810 Encounter for preprocedural cardiovascular examination: Secondary | ICD-10-CM

## 2013-10-31 DIAGNOSIS — Z6841 Body Mass Index (BMI) 40.0 and over, adult: Secondary | ICD-10-CM | POA: Insufficient documentation

## 2013-10-31 DIAGNOSIS — Z87891 Personal history of nicotine dependence: Secondary | ICD-10-CM | POA: Insufficient documentation

## 2013-10-31 DIAGNOSIS — Z8249 Family history of ischemic heart disease and other diseases of the circulatory system: Secondary | ICD-10-CM | POA: Insufficient documentation

## 2013-10-31 DIAGNOSIS — I509 Heart failure, unspecified: Secondary | ICD-10-CM | POA: Insufficient documentation

## 2013-10-31 DIAGNOSIS — I10 Essential (primary) hypertension: Secondary | ICD-10-CM

## 2013-10-31 DIAGNOSIS — N83209 Unspecified ovarian cyst, unspecified side: Secondary | ICD-10-CM

## 2013-10-31 DIAGNOSIS — E1065 Type 1 diabetes mellitus with hyperglycemia: Secondary | ICD-10-CM

## 2013-10-31 DIAGNOSIS — Z9989 Dependence on other enabling machines and devices: Secondary | ICD-10-CM

## 2013-10-31 DIAGNOSIS — E785 Hyperlipidemia, unspecified: Secondary | ICD-10-CM | POA: Insufficient documentation

## 2013-10-31 DIAGNOSIS — Z8679 Personal history of other diseases of the circulatory system: Secondary | ICD-10-CM

## 2013-10-31 DIAGNOSIS — I252 Old myocardial infarction: Secondary | ICD-10-CM | POA: Insufficient documentation

## 2013-10-31 DIAGNOSIS — D279 Benign neoplasm of unspecified ovary: Principal | ICD-10-CM | POA: Insufficient documentation

## 2013-10-31 DIAGNOSIS — M549 Dorsalgia, unspecified: Secondary | ICD-10-CM | POA: Insufficient documentation

## 2013-10-31 DIAGNOSIS — E1165 Type 2 diabetes mellitus with hyperglycemia: Principal | ICD-10-CM

## 2013-10-31 DIAGNOSIS — D649 Anemia, unspecified: Secondary | ICD-10-CM | POA: Insufficient documentation

## 2013-10-31 DIAGNOSIS — Z9889 Other specified postprocedural states: Secondary | ICD-10-CM

## 2013-10-31 DIAGNOSIS — E0500 Thyrotoxicosis with diffuse goiter without thyrotoxic crisis or storm: Secondary | ICD-10-CM | POA: Insufficient documentation

## 2013-10-31 DIAGNOSIS — E039 Hypothyroidism, unspecified: Secondary | ICD-10-CM

## 2013-10-31 DIAGNOSIS — N949 Unspecified condition associated with female genital organs and menstrual cycle: Secondary | ICD-10-CM | POA: Insufficient documentation

## 2013-10-31 DIAGNOSIS — E119 Type 2 diabetes mellitus without complications: Secondary | ICD-10-CM

## 2013-10-31 DIAGNOSIS — N289 Disorder of kidney and ureter, unspecified: Secondary | ICD-10-CM

## 2013-10-31 DIAGNOSIS — R600 Localized edema: Secondary | ICD-10-CM | POA: Insufficient documentation

## 2013-10-31 DIAGNOSIS — Z833 Family history of diabetes mellitus: Secondary | ICD-10-CM | POA: Insufficient documentation

## 2013-10-31 DIAGNOSIS — G609 Hereditary and idiopathic neuropathy, unspecified: Secondary | ICD-10-CM | POA: Insufficient documentation

## 2013-10-31 LAB — CBC
HCT: 41.5 % (ref 36.0–46.0)
Hemoglobin: 13.8 g/dL (ref 12.0–15.0)
MCH: 32.2 pg (ref 26.0–34.0)
MCHC: 33.3 g/dL (ref 30.0–36.0)
MCV: 96.7 fL (ref 78.0–100.0)
PLATELETS: 268 10*3/uL (ref 150–400)
RBC: 4.29 MIL/uL (ref 3.87–5.11)
RDW: 13.6 % (ref 11.5–15.5)
WBC: 7.4 10*3/uL (ref 4.0–10.5)

## 2013-10-31 LAB — COMPREHENSIVE METABOLIC PANEL
ALBUMIN: 3.7 g/dL (ref 3.5–5.2)
ALK PHOS: 117 U/L (ref 39–117)
ALT: 28 U/L (ref 0–35)
AST: 19 U/L (ref 0–37)
BILIRUBIN TOTAL: 0.3 mg/dL (ref 0.3–1.2)
BUN: 29 mg/dL — ABNORMAL HIGH (ref 6–23)
CO2: 27 mEq/L (ref 19–32)
Calcium: 9.8 mg/dL (ref 8.4–10.5)
Chloride: 100 mEq/L (ref 96–112)
Creatinine, Ser: 1.34 mg/dL — ABNORMAL HIGH (ref 0.50–1.10)
GFR calc Af Amer: 49 mL/min — ABNORMAL LOW (ref >90)
GFR calc non Af Amer: 42 mL/min — ABNORMAL LOW (ref >90)
Glucose, Bld: 209 mg/dL — ABNORMAL HIGH (ref 70–99)
POTASSIUM: 4.6 meq/L (ref 3.7–5.3)
SODIUM: 141 meq/L (ref 137–147)
Total Protein: 6.8 g/dL (ref 6.0–8.3)

## 2013-10-31 LAB — TYPE AND SCREEN
ABO/RH(D): A POS
Antibody Screen: NEGATIVE

## 2013-10-31 LAB — GLUCOSE, CAPILLARY
GLUCOSE-CAPILLARY: 245 mg/dL — AB (ref 70–99)
Glucose-Capillary: 179 mg/dL — ABNORMAL HIGH (ref 70–99)

## 2013-10-31 LAB — TSH: TSH: 1.58 u[IU]/mL (ref 0.350–4.500)

## 2013-10-31 LAB — ABO/RH: ABO/RH(D): A POS

## 2013-10-31 MED ORDER — BACLOFEN 10 MG PO TABS
10.0000 mg | ORAL_TABLET | Freq: Every day | ORAL | Status: DC
  Filled 2013-10-31 (×2): qty 1

## 2013-10-31 MED ORDER — LACTATED RINGERS IV SOLN
INTRAVENOUS | Status: DC
  Administered 2013-11-01: 09:00:00 via INTRAVENOUS

## 2013-10-31 MED ORDER — INSULIN ASPART 100 UNIT/ML ~~LOC~~ SOLN
0.0000 [IU] | Freq: Three times a day (TID) | SUBCUTANEOUS | Status: DC
  Administered 2013-11-01: 5 [IU] via SUBCUTANEOUS
  Administered 2013-11-01: 3 [IU] via SUBCUTANEOUS
  Administered 2013-11-02: 5 [IU] via SUBCUTANEOUS

## 2013-10-31 MED ORDER — LEVOTHYROXINE SODIUM 75 MCG PO TABS
75.0000 ug | ORAL_TABLET | Freq: Every day | ORAL | Status: DC
  Administered 2013-11-02: 75 ug via ORAL
  Filled 2013-10-31 (×2): qty 1

## 2013-10-31 MED ORDER — SODIUM CHLORIDE 0.9 % IV SOLN
INTRAVENOUS | Status: DC
  Administered 2013-10-31: 22:00:00 via INTRAVENOUS

## 2013-10-31 MED ORDER — ALLOPURINOL 300 MG PO TABS
300.0000 mg | ORAL_TABLET | Freq: Every day | ORAL | Status: DC
  Filled 2013-10-31 (×2): qty 1

## 2013-10-31 MED ORDER — SPIRONOLACTONE 25 MG PO TABS
50.0000 mg | ORAL_TABLET | Freq: Every day | ORAL | Status: DC
  Filled 2013-10-31: qty 2
  Filled 2013-10-31: qty 1

## 2013-10-31 MED ORDER — NEBIVOLOL HCL 5 MG PO TABS
5.0000 mg | ORAL_TABLET | Freq: Every day | ORAL | Status: DC
  Filled 2013-10-31 (×2): qty 1

## 2013-10-31 MED ORDER — HEPARIN SODIUM (PORCINE) 5000 UNIT/ML IJ SOLN
5000.0000 [IU] | Freq: Three times a day (TID) | INTRAMUSCULAR | Status: DC
  Administered 2013-10-31 – 2013-11-01 (×2): 5000 [IU] via SUBCUTANEOUS
  Filled 2013-10-31 (×6): qty 1

## 2013-10-31 MED ORDER — BUTORPHANOL TARTRATE 1 MG/ML IJ SOLN
2.0000 mg | INTRAMUSCULAR | Status: DC | PRN
  Administered 2013-11-01: 2 mg via INTRAVENOUS
  Filled 2013-10-31 (×2): qty 2

## 2013-10-31 MED ORDER — COLESEVELAM HCL 625 MG PO TABS
1875.0000 mg | ORAL_TABLET | Freq: Two times a day (BID) | ORAL | Status: DC
  Administered 2013-11-02: 1875 mg via ORAL
  Filled 2013-10-31 (×3): qty 3

## 2013-10-31 MED ORDER — CYCLOSPORINE 0.05 % OP EMUL
1.0000 [drp] | Freq: Two times a day (BID) | OPHTHALMIC | Status: DC
  Administered 2013-10-31 – 2013-11-01 (×2): 1 [drp] via OPHTHALMIC
  Filled 2013-10-31 (×4): qty 1

## 2013-10-31 MED ORDER — TEMAZEPAM 15 MG PO CAPS: 30.0000 mg | ORAL_CAPSULE | Freq: Every evening | ORAL | Status: DC | PRN

## 2013-10-31 MED ORDER — INSULIN ASPART 100 UNIT/ML ~~LOC~~ SOLN: 0.0000 [IU] | Freq: Every day | SUBCUTANEOUS | Status: DC

## 2013-10-31 MED ORDER — PANTOPRAZOLE SODIUM 40 MG PO TBEC
40.0000 mg | DELAYED_RELEASE_TABLET | Freq: Every day | ORAL | Status: DC
  Filled 2013-10-31 (×2): qty 1

## 2013-10-31 MED ORDER — EZETIMIBE 10 MG PO TABS
10.0000 mg | ORAL_TABLET | Freq: Every day | ORAL | Status: DC
  Administered 2013-10-31: 10 mg via ORAL
  Filled 2013-10-31 (×3): qty 1

## 2013-10-31 MED ORDER — BUPROPION HCL 100 MG PO TABS
100.0000 mg | ORAL_TABLET | Freq: Three times a day (TID) | ORAL | Status: DC
  Administered 2013-10-31 – 2013-11-01 (×2): 100 mg via ORAL
  Filled 2013-10-31 (×5): qty 1

## 2013-10-31 MED ORDER — GABAPENTIN 300 MG PO CAPS
600.0000 mg | ORAL_CAPSULE | Freq: Every day | ORAL | Status: DC
  Administered 2013-10-31 – 2013-11-01 (×2): 600 mg via ORAL
  Filled 2013-10-31 (×2): qty 2

## 2013-10-31 MED ORDER — OXYCODONE-ACETAMINOPHEN 5-325 MG PO TABS
2.0000 | ORAL_TABLET | ORAL | Status: DC | PRN
  Administered 2013-10-31 – 2013-11-01 (×2): 2 via ORAL
  Filled 2013-10-31 (×2): qty 2

## 2013-10-31 MED ORDER — POTASSIUM CHLORIDE CRYS ER 20 MEQ PO TBCR
40.0000 meq | EXTENDED_RELEASE_TABLET | Freq: Every day | ORAL | Status: DC
  Filled 2013-10-31 (×2): qty 2

## 2013-10-31 MED ORDER — FUROSEMIDE 40 MG PO TABS
80.0000 mg | ORAL_TABLET | Freq: Every day | ORAL | Status: DC
  Filled 2013-10-31: qty 1
  Filled 2013-10-31: qty 2

## 2013-10-31 MED ORDER — METHYLPHENIDATE HCL ER (OSM) 36 MG PO TBCR: 36.0000 mg | EXTENDED_RELEASE_TABLET | ORAL | Status: DC

## 2013-10-31 MED ORDER — INSULIN GLARGINE 100 UNIT/ML ~~LOC~~ SOLN
7.0000 [IU] | Freq: Every day | SUBCUTANEOUS | Status: DC
  Administered 2013-10-31 – 2013-11-01 (×2): 7 [IU] via SUBCUTANEOUS
  Filled 2013-10-31 (×2): qty 0.07

## 2013-10-31 NOTE — H&P (Signed)
Admission History and Physical Exam for a Gynecology Patient  [name] is a 63 y.o. female who presents for diagnostic laparoscopy and laparoscopic removal of pelvic mass (possible laparoscopic ovarian cystectomy). The patient presented to the emergency department in March of 2015 complaining of abdominal pain. A CT scan was performed that showed a 7.7 cm simple cystic structure adjacent to the apex of the vagina. This was thought to represent a possible ovarian cyst or perhaps a possible pelvic inclusion cyst. The patient had a total bowel hysterectomy in 1995 because of fibroids and pelvic pain. She reports having diarrhea for several days. The patient has multiple medical problems outlined below. Her blood sugar was noted to be 400 earlier. The most appropriate course of action was thought to admit the patient to the hospital tonight so that we can optimize her medical condition for surgery tomorrow. She has been followed at the [facility] and Gynecology division of [facility].  OB History   Grav Para Term Preterm Abortions TAB SAB Ect Mult Living                  Past Medical History  Diagnosis Date  . CHF (congestive heart failure)   . Hypertension   . High cholesterol   . Thyroid disease   . Diabetes mellitus   . Back pain, chronic   . Renal disorder   . Obesity   . OSA on CPAP   . Edema     Prescriptions prior to admission  Medication Sig Dispense Refill  . allopurinol (ZYLOPRIM) 300 MG tablet Take 300 mg by mouth daily.      . baclofen (LIORESAL) 10 MG tablet Take 10 mg by mouth daily.      Marland Kitchen buPROPion (WELLBUTRIN) 100 MG tablet Take 100 mg by mouth 3 (three) times daily.      . Canagliflozin (INVOKANA) 300 MG TABS Take 1 tablet by mouth daily.      . colesevelam (WELCHOL) 625 MG tablet Take 1,875 mg by mouth 2 (two) times daily with a meal.      . cycloSPORINE (RESTASIS) 0.05 % ophthalmic emulsion Place 1 drop into both eyes 2  (two) times daily.      Marland Kitchen ezetimibe (ZETIA) 10 MG tablet Take 10 mg by mouth daily.      . furosemide (LASIX) 80 MG tablet Take 80 mg by mouth.      . gabapentin (NEURONTIN) 600 MG tablet Take 600 mg by mouth at bedtime.      Marland Kitchen glyBURIDE (DIABETA) 5 MG tablet Take 5 mg by mouth daily with supper.      Marland Kitchen HYDROcodone-acetaminophen (NORCO/VICODIN) 5-325 MG per tablet Take 1-2 tablets by mouth every 6 (six) hours as needed.  15 tablet  0  . levothyroxine (SYNTHROID, LEVOTHROID) 75 MCG tablet Take 75 mcg by mouth daily.      . meloxicam (MOBIC) 15 MG tablet Take 15 mg by mouth daily.      . methylphenidate (CONCERTA) 36 MG CR tablet Take 36 mg by mouth every morning.      . naproxen sodium (ANAPROX) 220 MG tablet Take 220 mg by mouth daily as needed (used for headache.).      Marland Kitchen nebivolol (BYSTOLIC) 5 MG tablet Take 5 mg by mouth daily.      Marland Kitchen nystatin-triamcinolone (MYCOLOG II) cream as needed.      . pantoprazole (PROTONIX) 40 MG tablet Take 40 mg by mouth daily.      Marland Kitchen  potassium chloride SA (K-DUR,KLOR-CON) 20 MEQ tablet Take 40 mEq by mouth daily.       Marland Kitchen spironolactone (ALDACTONE) 50 MG tablet Take 50 mg by mouth daily.      . temazepam (RESTORIL) 30 MG capsule Take 30 mg by mouth at bedtime as needed for sleep.      . Vitamin D, Ergocalciferol, (DRISDOL) 50000 UNITS CAPS Take 50,000 Units by mouth 2 (two) times a week. Mondays and fridays        Past Surgical History  Procedure Laterality Date  . Nephrolithotomy      Right  . Abdominal hysterectomy    . Eye surgery    . Bunionectomy      both feet  . Left knee surgery      Torn meniscus  . US echocardiography  12/24/2011    mild LVH,mild TR,trace MR,PI    Allergies  Allergen Reactions  . Lipitor [Atorvastatin Calcium] Other (See Comments)    Myocitis    Family History: family history includes Cancer in her father; Diabetes in her father and mother; Heart attack in her brother; Hyperlipidemia in her brother and mother;  Hypertension in her brother, father, and mother.  Social History:  reports that she quit smoking about 11 years ago. She has never used smokeless tobacco. She reports that she does not drink alcohol or use illicit drugs.  Review of systems: See HPI.  Admission Physical Exam:    There is no weight on file to calculate BMI.  There were no vitals taken for this visit.  HEENT:                 Within normal limits. 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 Prosthesis in place. Chest:                   Clear Heart:                    Regular rate and rhythm Breasts:                No masses, skin changes, bleeding, or discharge present Abdomen:             Nontender, no masses. Bowel sounds positive. No fluid wave. No rebound tenderness. Extremities:          2+ edema. No cords or masses. Neurologic exam: Grossly normal  Pelvic exam:  External genitalia: normal general appearance and Atrophic changes Vaginal: cystocele present, Atrophic and rectocele present, Fullness noted at the apex Adnexa: Tender in the midline Rectal: good sphincter tone  Assessment:  Pelvic mass (possible ovarian cyst) Pelvic pain believed to be associated with her pelvic mass Poorly-controlled insulin-dependent diabetes Morbid obesity Hypertension Dyslipidemia Obstructive sleep apnea Possible mild pulmonary hypertension Chronic lower extremity edema  congestive heart failure  chronic back pain Thyroid disease     Plan:  We will enlist the assistance of the Hospitalist to maximize her blood sugar control.  Tomorrow we will plan to perform a diagnostic laparoscopy and possible ovarian cystectomy.  Because of her multiple co-morbidities, we will monitor the patient in the hospital after her surgery. We will plan to discharge the patient on 11/02/2013.    Eli Hose 10/31/2013

## 2013-10-31 NOTE — Progress Notes (Signed)
Setup cpap machine in patient room when called by RN for order. Informed pt I will come back if she wants help placing it on when she wants to wear it.

## 2013-10-31 NOTE — Progress Notes (Signed)
OFFICE NOTE  Chief Complaint:  Pre-operative cardiac evaluation, ovarian cyst  Primary Care Physician: Tivis Ringer, MD  HPI:  Abigail Johns is a 63 year old woman with a history of insulin-dependent diabetes is poorly controlled. Her recent A1c was greater than 11. Just has morbid obesity, hypertension, dyslipidemia, obstructive sleep apnea and possible mild pulmonary hypertension. She has chronic lower extremity edema. She last saw Dr. Sallyanne Kuster in the office in May of 2014. She was describing shortness of breath despite losing some weight without any signs of heart failure. At that time he felt it was reasonable for her to undergo a stress test to rule out obstructive coronary disease. She did undergo a nuclear stress test in May of 2014 which was low risk with a small area of breast attenuation artifact. EF was 73%. I was contacted today from her OB/GYN as she was identified as having an ovarian cyst with significant pain. He is contemplating urgent surgery tomorrow and is requesting cardiac clearance. Abigail Johns was notably tearful in the office today and appeared in significant pain.  She does not describe any recent chest pain or worsening shortness of breath with exertion. She has no valvular abnormalities. As mentioned her stress test was negative within the past year for ischemia.  PMHx:  Past Medical History  Diagnosis Date  . CHF (congestive heart failure)   . Hypertension   . High cholesterol   . Thyroid disease   . Diabetes mellitus   . Back pain, chronic   . Renal disorder   . Obesity   . OSA on CPAP   . Edema     Past Surgical History  Procedure Laterality Date  . Nephrolithotomy      Right  . Abdominal hysterectomy    . Eye surgery    . Bunionectomy      both feet  . Left knee surgery      Torn meniscus  . US echocardiography  12/24/2011    mild LVH,mild TR,trace MR,PI    FAMHx:  Family History  Problem Relation Age of Onset  . Diabetes Mother    . Hyperlipidemia Mother   . Hypertension Mother   . Hypertension Father   . Diabetes Father   . Cancer Father   . Heart attack Brother   . Hyperlipidemia Brother   . Hypertension Brother     SOCHx:   reports that she quit smoking about 11 years ago. She has never used smokeless tobacco. She reports that she does not drink alcohol or use illicit drugs.  ALLERGIES:  Allergies  Allergen Reactions  . Lipitor [Atorvastatin Calcium] Other (See Comments)    Myocitis    ROS: A comprehensive review of systems was negative except for: Constitutional: positive for pain Genitourinary: positive for ovarian cyst  HOME MEDS: No current outpatient prescriptions on file.   No current facility-administered medications for this visit.    LABS/IMAGING: No results found for this or any previous visit (from the past 48 hour(s)). No results found.  VITALS: BP 158/70  Pulse 72  Ht 5\' 5"  (1.651 m)  Wt 259 lb 14.4 oz (117.89 kg)  BMI 43.25 kg/m2  EXAM: General appearance: alert, mild distress and morbidly obese Lungs: clear to auscultation bilaterally Heart: regular rate and rhythm, S1, S2 normal, no murmur, click, rub or gallop Extremities: edema 2+ chronic stasis edema Pulses: 2+ and symmetric  EKG: NSR at 72, no ischemic changes  ASSESSMENT: 1. Low to intermediate risk for an intermediate risk  surgery 2. Poorly-controlled insulin-dependent diabetes 3. Morbid obesity 4. Obstructive sleep apnea 5. Hypertension  PLAN: 1.   Abigail Johns is at low to intermediate risk for her upcoming surgery. This is not particularly a laxative due to the fact that she is in significant pain and has a large ovarian cyst. I discussed the case with her OB/GYN and he is planning on admitting her for pain control and control of her diabetes prior to surgery. I think this is a wise decision and may improve her outcome after surgery. She understands there is risk for surgery however she wishes to proceed. I  think the fact that she had a low-risk stress test with the past year is somewhat reassuring.  Thanks for consult asked for preoperative risk assessment. If you require further in hospital a device, please don't hesitate to contact us.  Pixie Casino, MD, [facility] Attending Cardiologist [facility]  HILTY,Kenneth C 10/31/2013, 6:45 PM

## 2013-10-31 NOTE — Consult Note (Addendum)
[facility] Medical Consultation  Chery Giusto JKD:[id number] DOB: [birth year] DOA: 10/31/2013 PCP: Tivis Ringer, MD   Requesting physician: Ena Dawley, M.D. Date of consultation: 10/31/13 Reason for consultation: Diabetes management PCP: Dr. Dagmar Hait Cardiologist: Dr. Sallyanne Kuster  Impression/Recommendations Principal Problem:   DM type 2 (diabetes mellitus, type 2) Active Problems:   OSA on CPAP   HTN (hypertension)   Morbid obesity   History of CHF (congestive heart failure)   Pelvic mass    1. Type 2 diabetes mellitus, non-insulin requiring. The patient has had non-insulin requiring diabetes mellitus for more than 20 years. She was initially controlled on glyburide alone. Her PCP recently started another agent, Invokana, approximately 6 weeks ago when her hemoglobin A1c increased to greater than 11. Her current CBG is 245. Only recently has she had some blurred vision and mild neuropathic type changes in her feet. For treatment, we will hold her oral agents given her marginal oral intake over the past several days and in the hospital setting. Will start sliding scale NovoLog-moderate scale and small dosing of Lantus each bedtime. Will start gentle IV fluids which will help with hyperglycemia. We'll order another hemoglobin A1c. 2. History of CHF. The patient was seen by cardiologist, Dr. Debara Pickett today and cleared for surgery. She underwent a nuclear stress test in May 2014. The stress test was low risk and she had an ejection fraction of 73%. It appears that she may have diastolic dysfunction. I do not see a recent 2-D echocardiogram. She is treated chronically with Aldactone, Lasix, and Bystolic. We'll hold these medications overnight and restart them tomorrow, but the patient appears to be a little volume contracted so will start gentle IV fluids tonight. Her lungs are clear on exam. She appears to be compensated. We'll order a chest x-ray for baseline evaluation. Result of the  EKG ordered by Dr. Debara Pickett in his office is pending. 3. Renal insufficiency likely acute. In March 2015, her creatinine was 1.17. She denies any history of being told of chronic kidney disease. Her creatinine today is 1.34, which may be secondary to prerenal azotemia given her recent poor intake. This will be treated with gentle IV fluids. 4. Hypertension. Currently stable on Bystolic. This should be continued. 5. Obstructive sleep apnea. Would recommend continuing CPAP. 6. Hypothyroidism. We'll continue Synthroid. We'll order TSH and free T4.  I will followup again tomorrow. Please contact me if I can be of assistance in the meanwhile. Thank you for this consultation.  Chief Complaint: Right lower quadrant pain and uncontrolled diabetes.  HPI:  The patient is a 63 year old woman who was admitted by OB/GYN Dr. Raphael Gibney for right lower quadrant/pelvic abdominal pain. CT performed in the emergency department in March 2015 revealed a 7.7 cm simple cystic structure adjacent to the aspect of the vagina. He plans to perform a diagnostic laparoscopy and laparoscopic removal of the pelvic mass. The patient's blood glucose has been uncontrolled as of late. At home, her blood sugars have been ranging in the 300 to 400s. She has had increased thirst and increased urination. She has had occasional blurred vision which had been relatively new for her. She has also had numbness sensation in her toes which has also been recent. She denies any history of insulin use. She denies any history of retinopathy- type symptoms or any known history of renal insufficiency from her diabetes. She denies any history of diabetic ulcers. Her primary care physician, Dr. Dagmar Hait recently added Invokana approximately 6 or 7 weeks ago because her  hemoglobin A1c had increased from 9 to greater than 11. She had been relatively controlled on glyburide alone for several years up until recently.  Review of Systems:  As above in history present  illness. Specifically, she denies chest pain, shortness of breath, chest congestion, worsening swelling in her legs.  Past Medical History  Diagnosis Date  . CHF (congestive heart failure)   . Hypertension   . High cholesterol   . Thyroid disease   . Diabetes mellitus   . Back pain, chronic   . Renal disorder   . Obesity   . OSA on CPAP   . Edema    Past Surgical History  Procedure Laterality Date  . Nephrolithotomy      Right  . Abdominal hysterectomy    . Eye surgery    . Bunionectomy      both feet  . Left knee surgery      Torn meniscus  . US echocardiography  12/24/2011    mild LVH,mild TR,trace MR,PI   Social History:  reports that she quit smoking about 11 years ago. She has never used smokeless tobacco. She reports that she does not drink alcohol or use illicit drugs.  Allergies  Allergen Reactions  . Lipitor [Atorvastatin Calcium] Other (See Comments)    Myocitis   Family History  Problem Relation Age of Onset  . Diabetes Mother   . Hyperlipidemia Mother   . Hypertension Mother   . Hypertension Father   . Diabetes Father   . Cancer Father   . Heart attack Brother   . Hyperlipidemia Brother   . Hypertension Brother     Prior to Admission medications   Medication Sig Start Date End Date Taking? Authorizing Provider  allopurinol (ZYLOPRIM) 300 MG tablet Take 300 mg by mouth daily.   Yes Historical Provider, MD  baclofen (LIORESAL) 10 MG tablet Take 10 mg by mouth daily.   Yes Historical Provider, MD  buPROPion (WELLBUTRIN) 100 MG tablet Take 100 mg by mouth 3 (three) times daily.   Yes Historical Provider, MD  Canagliflozin (INVOKANA) 300 MG TABS Take 1 tablet by mouth daily.   Yes Historical Provider, MD  colesevelam (WELCHOL) 625 MG tablet Take 1,875 mg by mouth 2 (two) times daily with a meal.   Yes Historical Provider, MD  cycloSPORINE (RESTASIS) 0.05 % ophthalmic emulsion Place 1 drop into both eyes 2 (two) times daily.   Yes Historical Provider, MD   ezetimibe (ZETIA) 10 MG tablet Take 10 mg by mouth daily.   Yes Historical Provider, MD  furosemide (LASIX) 80 MG tablet Take 80 mg by mouth.   Yes Historical Provider, MD  gabapentin (NEURONTIN) 600 MG tablet Take 600 mg by mouth at bedtime.   Yes Historical Provider, MD  glyBURIDE (DIABETA) 5 MG tablet Take 5 mg by mouth daily with supper.   Yes Historical Provider, MD  HYDROcodone-acetaminophen (NORCO/VICODIN) 5-325 MG per tablet Take 1-2 tablets by mouth every 6 (six) hours as needed. 10/23/13  Yes Montine Circle, PA-C  levothyroxine (SYNTHROID, LEVOTHROID) 75 MCG tablet Take 75 mcg by mouth daily.   Yes Historical Provider, MD  meloxicam (MOBIC) 15 MG tablet Take 15 mg by mouth daily.   Yes Historical Provider, MD  methylphenidate (CONCERTA) 36 MG CR tablet Take 36 mg by mouth every morning.   Yes Historical Provider, MD  naproxen sodium (ANAPROX) 220 MG tablet Take 220 mg by mouth daily as needed (used for headache.).   Yes Historical Provider, MD  nebivolol (BYSTOLIC) 5 MG tablet Take 5 mg by mouth daily.   Yes Historical Provider, MD  nystatin-triamcinolone (MYCOLOG II) cream as needed. 08/31/13  Yes Historical Provider, MD  pantoprazole (PROTONIX) 40 MG tablet Take 40 mg by mouth daily.   Yes Historical Provider, MD  potassium chloride SA (K-DUR,KLOR-CON) 20 MEQ tablet Take 40 mEq by mouth daily.    Yes Historical Provider, MD  spironolactone (ALDACTONE) 50 MG tablet Take 50 mg by mouth daily.   Yes Historical Provider, MD  temazepam (RESTORIL) 30 MG capsule Take 30 mg by mouth at bedtime as needed for sleep.   Yes Historical Provider, MD  Vitamin D, Ergocalciferol, (DRISDOL) 50000 UNITS CAPS Take 50,000 Units by mouth 2 (two) times a week. Mondays and fridays   Yes Historical Provider, MD   Physical Exam: Height 5\' 5"  (1.651 m), weight 120.203 kg (265 lb). There were no vitals filed for this visit. Temperature pending. Pulse 72. Blood pressure 158/70. Oxygen saturation  pending.   General:  Pleasant obese 63 year old woman laying in bed, in no acute distress.  Eyes: Pupils equal, round, and reactive to light. Extraocular movements intact. Conjunctivae are clear, sclerae are white.  ENT: Oropharynx reveals mildly dry mucous membranes. No exudates or erythema.  Neck: Supple, no adenopathy, no thyromegaly, no JVD.  Cardiovascular: S1, S2, no murmurs rubs or gallops. No pedal edema.  Respiratory: Clear to auscultation bilaterally.  Abdomen: Obese, positive bowel sounds, soft, mildly to moderately tender at the right lower quadrant/inguinal region. No rigidity.  Skin: Fair turgor. No rashes.  Musculoskeletal: Muscle tone within normal limits. No acute hot red joints.  Psychiatric: Alert and oriented x3. Speech is clear.  Neurologic: Cranial nerves II through XII are intact.  Labs on Admission:  Basic Metabolic Panel:  Recent Labs Lab 10/31/13 1940  NA 141  K 4.6  CL 100  CO2 27  GLUCOSE 209*  BUN 29*  CREATININE 1.34*  CALCIUM 9.8   Liver Function Tests:  Recent Labs Lab 10/31/13 1940  AST 19  ALT 28  ALKPHOS 117  BILITOT 0.3  PROT 6.8  ALBUMIN 3.7   No results found for this basename: LIPASE, AMYLASE,  in the last 168 hours No results found for this basename: AMMONIA,  in the last 168 hours CBC:  Recent Labs Lab 10/31/13 1940  WBC 7.4  HGB 13.8  HCT 41.5  MCV 96.7  PLT 268   Cardiac Enzymes: No results found for this basename: CKTOTAL, CKMB, CKMBINDEX, TROPONINI,  in the last 168 hours BNP: No components found with this basename: POCBNP,  CBG:  Recent Labs Lab 10/31/13 1808  GLUCAP 245*    Radiological Exams on Admission: No results found.  EKG: Independently reviewed. Pending  Time spent: One hour  [facility] Pager [phone]  If 7PM-7AM, please contact night-coverage [email or web address] Password [facility] 10/31/2013, 8:41 PM

## 2013-11-01 ENCOUNTER — Inpatient Hospital Stay (HOSPITAL_COMMUNITY): Payer: BC Managed Care – PPO | Admitting: Anesthesiology

## 2013-11-01 ENCOUNTER — Encounter (HOSPITAL_COMMUNITY): Payer: Self-pay | Admitting: Anesthesiology

## 2013-11-01 ENCOUNTER — Ambulatory Visit (HOSPITAL_COMMUNITY)
Admission: RE | Admit: 2013-11-01 | Payer: BC Managed Care – PPO | Source: Ambulatory Visit | Admitting: Obstetrics and Gynecology

## 2013-11-01 ENCOUNTER — Encounter (HOSPITAL_COMMUNITY): Payer: BC Managed Care – PPO | Admitting: Anesthesiology

## 2013-11-01 ENCOUNTER — Encounter (HOSPITAL_COMMUNITY)
Admission: AD | Disposition: A | Discharge status: Home / Self Care | Payer: Self-pay | Source: Ambulatory Visit | Attending: Obstetrics and Gynecology

## 2013-11-01 DIAGNOSIS — G4733 Obstructive sleep apnea (adult) (pediatric): Secondary | ICD-10-CM

## 2013-11-01 DIAGNOSIS — Z9889 Other specified postprocedural states: Secondary | ICD-10-CM

## 2013-11-01 HISTORY — PX: LAPAROSCOPIC OVARIAN CYSTECTOMY: SHX6248

## 2013-11-01 HISTORY — PX: LAPAROSCOPIC LYSIS OF ADHESIONS: SHX5905

## 2013-11-01 HISTORY — PX: LAPAROSCOPY: SHX197

## 2013-11-01 LAB — GLUCOSE, CAPILLARY
GLUCOSE-CAPILLARY: 171 mg/dL — AB (ref 70–99)
GLUCOSE-CAPILLARY: 210 mg/dL — AB (ref 70–99)
GLUCOSE-CAPILLARY: 222 mg/dL — AB (ref 70–99)
GLUCOSE-CAPILLARY: 198 mg/dL — AB (ref 70–99)
Glucose-Capillary: 219 mg/dL — ABNORMAL HIGH (ref 70–99)
Glucose-Capillary: 165 mg/dL — ABNORMAL HIGH (ref 70–99)

## 2013-11-01 LAB — HEMOGLOBIN A1C
Hgb A1c MFr Bld: 11.1 % — ABNORMAL HIGH (ref <5.7)
Mean Plasma Glucose: 272 mg/dL — ABNORMAL HIGH (ref <117)

## 2013-11-01 LAB — BASIC METABOLIC PANEL
BUN: 27 mg/dL — AB (ref 6–23)
CHLORIDE: 97 meq/L (ref 96–112)
CO2: 24 mEq/L (ref 19–32)
Calcium: 9.2 mg/dL (ref 8.4–10.5)
Creatinine, Ser: 1.05 mg/dL (ref 0.50–1.10)
GFR calc non Af Amer: 57 mL/min — ABNORMAL LOW (ref >90)
GFR, EST AFRICAN AMERICAN: 66 mL/min — AB (ref >90)
GLUCOSE: 188 mg/dL — AB (ref 70–99)
Potassium: 4.1 mEq/L (ref 3.7–5.3)
Sodium: 135 mEq/L — ABNORMAL LOW (ref 137–147)

## 2013-11-01 LAB — T4, FREE: Free T4: 1.51 ng/dL (ref 0.80–1.80)

## 2013-11-01 LAB — MRSA PCR SCREENING: MRSA by PCR: NEGATIVE

## 2013-11-01 SURGERY — EXCISION, CYST, OVARY, LAPAROSCOPIC
Anesthesia: General | Site: Abdomen | Laterality: Right

## 2013-11-01 MED ORDER — ROCURONIUM BROMIDE 100 MG/10ML IV SOLN
INTRAVENOUS | Status: DC | PRN
  Administered 2013-11-01: 20 mg via INTRAVENOUS
  Administered 2013-11-01: 30 mg via INTRAVENOUS

## 2013-11-01 MED ORDER — MEPERIDINE HCL 25 MG/ML IJ SOLN: 6.2500 mg | INTRAMUSCULAR | Status: DC | PRN

## 2013-11-01 MED ORDER — BUTORPHANOL TARTRATE 1 MG/ML IJ SOLN
2.0000 mg | INTRAMUSCULAR | Status: DC | PRN
Start: 2013-11-01 — End: 2013-11-02

## 2013-11-01 MED ORDER — FENTANYL CITRATE 0.05 MG/ML IJ SOLN
INTRAMUSCULAR | Status: DC | PRN
  Administered 2013-11-01 (×3): 50 ug via INTRAVENOUS
  Administered 2013-11-01: 100 ug via INTRAVENOUS
  Administered 2013-11-01 (×2): 50 ug via INTRAVENOUS

## 2013-11-01 MED ORDER — HEPARIN SODIUM (PORCINE) 5000 UNIT/ML IJ SOLN
5000.0000 [IU] | Freq: Three times a day (TID) | INTRAMUSCULAR | Status: DC
  Administered 2013-11-01 – 2013-11-02 (×2): 5000 [IU] via SUBCUTANEOUS
  Filled 2013-11-01 (×3): qty 1

## 2013-11-01 MED ORDER — LACTATED RINGERS IV SOLN
INTRAVENOUS | Status: DC | PRN
  Administered 2013-11-01: 10:00:00 via INTRAVENOUS

## 2013-11-01 MED ORDER — FENTANYL CITRATE 0.05 MG/ML IJ SOLN: 25.0000 ug | INTRAMUSCULAR | Status: DC | PRN

## 2013-11-01 MED ORDER — ONDANSETRON HCL 4 MG PO TABS: 4.0000 mg | ORAL_TABLET | Freq: Four times a day (QID) | ORAL | Status: DC | PRN

## 2013-11-01 MED ORDER — IBUPROFEN 800 MG PO TABS
800.0000 mg | ORAL_TABLET | Freq: Three times a day (TID) | ORAL | Status: DC | PRN
  Administered 2013-11-01: 800 mg via ORAL
  Filled 2013-11-01: qty 1

## 2013-11-01 MED ORDER — DOCUSATE SODIUM 100 MG PO CAPS
100.0000 mg | ORAL_CAPSULE | Freq: Two times a day (BID) | ORAL | Status: DC
  Administered 2013-11-01: 100 mg via ORAL
  Filled 2013-11-01: qty 1

## 2013-11-01 MED ORDER — METHYLENE BLUE 1 % INJ SOLN
INTRAMUSCULAR | Status: AC
  Filled 2013-11-01: qty 10

## 2013-11-01 MED ORDER — METOCLOPRAMIDE HCL 5 MG/ML IJ SOLN: 10.0000 mg | Freq: Once | INTRAMUSCULAR | Status: DC | PRN

## 2013-11-01 MED ORDER — PROPOFOL 10 MG/ML IV BOLUS
INTRAVENOUS | Status: DC | PRN
  Administered 2013-11-01: 200 mg via INTRAVENOUS

## 2013-11-01 MED ORDER — LACTATED RINGERS IR SOLN
Status: DC | PRN
  Administered 2013-11-01: 3000 mL

## 2013-11-01 MED ORDER — BUPIVACAINE-EPINEPHRINE (PF) 0.5% -1:200000 IJ SOLN
INTRAMUSCULAR | Status: AC
  Filled 2013-11-01: qty 10

## 2013-11-01 MED ORDER — OXYCODONE-ACETAMINOPHEN 5-325 MG PO TABS
1.0000 | ORAL_TABLET | ORAL | Status: DC | PRN
  Administered 2013-11-01 – 2013-11-02 (×2): 2 via ORAL
  Filled 2013-11-01 (×2): qty 2

## 2013-11-01 MED ORDER — GLYCOPYRROLATE 0.2 MG/ML IJ SOLN
INTRAMUSCULAR | Status: DC | PRN
  Administered 2013-11-01: 0.6 mg via INTRAVENOUS

## 2013-11-01 MED ORDER — NEOSTIGMINE METHYLSULFATE 1 MG/ML IJ SOLN
INTRAMUSCULAR | Status: DC | PRN
  Administered 2013-11-01: 4 mg via INTRAVENOUS

## 2013-11-01 MED ORDER — ONDANSETRON HCL 4 MG/2ML IJ SOLN
4.0000 mg | Freq: Four times a day (QID) | INTRAMUSCULAR | Status: DC | PRN
  Administered 2013-11-01: 4 mg via INTRAVENOUS
  Filled 2013-11-01: qty 2

## 2013-11-01 MED ORDER — LIDOCAINE HCL (CARDIAC) 20 MG/ML IV SOLN
INTRAVENOUS | Status: DC | PRN
  Administered 2013-11-01: 80 mg via INTRAVENOUS

## 2013-11-01 MED ORDER — SIMETHICONE 80 MG PO CHEW: 80.0000 mg | CHEWABLE_TABLET | Freq: Four times a day (QID) | ORAL | Status: DC | PRN

## 2013-11-01 MED ORDER — SODIUM CHLORIDE 0.9 % IV SOLN
INTRAVENOUS | Status: DC | PRN
  Administered 2013-11-01: 12:00:00 via INTRAVENOUS

## 2013-11-01 MED ORDER — BUPIVACAINE-EPINEPHRINE 0.5% -1:200000 IJ SOLN
INTRAMUSCULAR | Status: DC | PRN
  Administered 2013-11-01: 12 mL

## 2013-11-01 MED ORDER — MIDAZOLAM HCL 2 MG/2ML IJ SOLN
INTRAMUSCULAR | Status: DC | PRN
  Administered 2013-11-01: 2 mg via INTRAVENOUS

## 2013-11-01 MED ORDER — ONDANSETRON HCL 4 MG/2ML IJ SOLN
INTRAMUSCULAR | Status: DC | PRN
  Administered 2013-11-01: 4 mg via INTRAVENOUS

## 2013-11-01 MED ORDER — HEPARIN SODIUM (PORCINE) 5000 UNIT/ML IJ SOLN
INTRAMUSCULAR | Status: AC
  Filled 2013-11-01: qty 1

## 2013-11-01 MED ORDER — DEXTROSE IN LACTATED RINGERS 5 % IV SOLN: INTRAVENOUS | Status: DC

## 2013-11-01 MED ORDER — INSULIN ASPART 100 UNIT/ML ~~LOC~~ SOLN
4.0000 [IU] | Freq: Once | SUBCUTANEOUS | Status: AC
  Administered 2013-11-01: 4 [IU] via SUBCUTANEOUS

## 2013-11-01 MED ORDER — HEPARIN SODIUM (PORCINE) 5000 UNIT/ML IJ SOLN
INTRAMUSCULAR | Status: DC | PRN
  Administered 2013-11-01: 5000 [IU] via SUBCUTANEOUS

## 2013-11-01 SURGICAL SUPPLY — 33 items
BAG SPEC RTRVL LRG 6X4 10 (ENDOMECHANICALS)
CABLE HIGH FREQUENCY MONO STRZ (ELECTRODE) IMPLANT
CHLORAPREP W/TINT 26ML (MISCELLANEOUS) ×4 IMPLANT
CLOSURE WOUND 1/4 X3 (GAUZE/BANDAGES/DRESSINGS) ×1
CLOTH BEACON ORANGE TIMEOUT ST (SAFETY) ×4 IMPLANT
DERMABOND ADVANCED (GAUZE/BANDAGES/DRESSINGS) ×2
DERMABOND ADVANCED .7 DNX12 (GAUZE/BANDAGES/DRESSINGS) ×2 IMPLANT
FORCEPS CUTTING 33CM 5MM (CUTTING FORCEPS) IMPLANT
FORCEPS CUTTING 45CM 5MM (CUTTING FORCEPS) IMPLANT
GAUZE VASELINE 3X9 (GAUZE/BANDAGES/DRESSINGS) ×4 IMPLANT
GLOVE BIOGEL PI IND STRL 8.5 (GLOVE) ×2 IMPLANT
GLOVE BIOGEL PI INDICATOR 8.5 (GLOVE) ×2
GLOVE ECLIPSE 8.0 STRL XLNG CF (GLOVE) ×8 IMPLANT
GOWN STRL REUS W/TWL 2XL LVL3 (GOWN DISPOSABLE) ×4 IMPLANT
GOWN STRL REUS W/TWL LRG LVL3 (GOWN DISPOSABLE) ×8 IMPLANT
NS IRRIG 1000ML POUR BTL (IV SOLUTION) ×4 IMPLANT
PACK LAPAROSCOPY BASIN (CUSTOM PROCEDURE TRAY) ×4 IMPLANT
POUCH SPECIMEN RETRIEVAL 10MM (ENDOMECHANICALS) IMPLANT
PROTECTOR NERVE ULNAR (MISCELLANEOUS) ×4 IMPLANT
RINGERS IRRIG 1000ML POUR BTL (IV SOLUTION) IMPLANT
SET IRRIG TUBING LAPAROSCOPIC (IRRIGATION / IRRIGATOR) ×4 IMPLANT
SHEARS HARMONIC ACE PLUS 36CM (ENDOMECHANICALS) IMPLANT
SPONGE GAUZE 4X4 12PLY (GAUZE/BANDAGES/DRESSINGS) ×8 IMPLANT
STRIP CLOSURE SKIN 1/4X3 (GAUZE/BANDAGES/DRESSINGS) ×3 IMPLANT
SUT MNCRL AB 3-0 PS2 27 (SUTURE) ×4 IMPLANT
SUT VIC AB 2-0 UR6 27 (SUTURE) ×8 IMPLANT
SUT VICRYL 0 ENDOLOOP (SUTURE) IMPLANT
SYR 50ML LL SCALE MARK (SYRINGE) IMPLANT
SYR TB 1ML 25GX5/8 (SYRINGE) ×4 IMPLANT
TOWEL OR 17X24 6PK STRL BLUE (TOWEL DISPOSABLE) ×8 IMPLANT
TRAY FOLEY CATH 14FR (SET/KITS/TRAYS/PACK) ×4 IMPLANT
WARMER LAPAROSCOPE (MISCELLANEOUS) ×4 IMPLANT
WATER STERILE IRR 1000ML POUR (IV SOLUTION) ×4 IMPLANT

## 2013-11-01 NOTE — Progress Notes (Signed)
Abigail Johns is a 63 y.o. year old female.  Subjective:  Ready for surgery.  Objective:  BP 126/60  Pulse 78  Temp(Src) 97.8 F (36.6 C) (Oral)  Resp 18  Ht 5\' 5"  (1.651 m)  Wt 265 lb (120.203 kg)  BMI 44.10 kg/m2  SpO2 95%   CBC    Component Value Date/Time   WBC 7.4 10/31/2013 1940   RBC 4.29 10/31/2013 1940   HGB 13.8 10/31/2013 1940   HCT 41.5 10/31/2013 1940   PLT 268 10/31/2013 1940   MCV 96.7 10/31/2013 1940   MCH 32.2 10/31/2013 1940   MCHC 33.3 10/31/2013 1940   RDW 13.6 10/31/2013 1940   LYMPHSABS 2.1 10/22/2013 2039   MONOABS 0.6 10/22/2013 2039   EOSABS 0.4 10/22/2013 2039   BASOSABS 0.0 10/22/2013 2039     Chest: Clear Heart: RRR  Results for orders placed during the hospital encounter of 10/31/13 (from the past 24 hour(s))  GLUCOSE, CAPILLARY     Status: Abnormal   Collection Time    10/31/13  6:08 PM      Result Value Ref Range   Glucose-Capillary 245 (*) 70 - 99 mg/dL   Comment 1 Notify RN    COMPREHENSIVE METABOLIC PANEL     Status: Abnormal   Collection Time    10/31/13  7:40 PM      Result Value Ref Range   Sodium 141  137 - 147 mEq/L   Potassium 4.6  3.7 - 5.3 mEq/L   Chloride 100  96 - 112 mEq/L   CO2 27  19 - 32 mEq/L   Glucose, Bld 209 (*) 70 - 99 mg/dL   BUN 29 (*) 6 - 23 mg/dL   Creatinine, Ser 1.34 (*) 0.50 - 1.10 mg/dL   Calcium 9.8  8.4 - 10.5 mg/dL   Total Protein 6.8  6.0 - 8.3 g/dL   Albumin 3.7  3.5 - 5.2 g/dL   AST 19  0 - 37 U/L   ALT 28  0 - 35 U/L   Alkaline Phosphatase 117  39 - 117 U/L   Total Bilirubin 0.3  0.3 - 1.2 mg/dL   GFR calc non Af Amer 42 (*) >90 mL/min   GFR calc Af Amer 49 (*) >90 mL/min  CBC     Status: None   Collection Time    10/31/13  7:40 PM      Result Value Ref Range   WBC 7.4  4.0 - 10.5 K/uL   RBC 4.29  3.87 - 5.11 MIL/uL   Hemoglobin 13.8  12.0 - 15.0 g/dL   HCT 41.5  36.0 - 46.0 %   MCV 96.7  78.0 - 100.0 fL   MCH 32.2  26.0 - 34.0 pg   MCHC 33.3  30.0 - 36.0 g/dL   RDW 13.6  11.5 - 15.5 %    Platelets 268  150 - 400 K/uL  HEMOGLOBIN A1C     Status: Abnormal   Collection Time    10/31/13  7:40 PM      Result Value Ref Range   Hemoglobin A1C 11.1 (*) <5.7 %   Mean Plasma Glucose 272 (*) <117 mg/dL  TSH     Status: None   Collection Time    10/31/13  7:40 PM      Result Value Ref Range   TSH 1.580  0.350 - 4.500 uIU/mL  T4, FREE     Status: None   Collection Time  10/31/13  7:40 PM      Result Value Ref Range   Free T4 1.51  0.80 - 1.80 ng/dL  TYPE AND SCREEN     Status: None   Collection Time    10/31/13  7:40 PM      Result Value Ref Range   ABO/RH(D) A POS     Antibody Screen NEG     Sample Expiration 11/03/2013    ABO/RH     Status: None   Collection Time    10/31/13  7:40 PM      Result Value Ref Range   ABO/RH(D) A POS    GLUCOSE, CAPILLARY     Status: Abnormal   Collection Time    10/31/13  9:00 PM      Result Value Ref Range   Glucose-Capillary 179 (*) 70 - 99 mg/dL  MRSA PCR SCREENING     Status: None   Collection Time    10/31/13 10:05 PM      Result Value Ref Range   MRSA by PCR NEGATIVE  NEGATIVE  BASIC METABOLIC PANEL     Status: Abnormal   Collection Time    11/01/13  5:25 AM      Result Value Ref Range   Sodium 135 (*) 137 - 147 mEq/L   Potassium 4.1  3.7 - 5.3 mEq/L   Chloride 97  96 - 112 mEq/L   CO2 24  19 - 32 mEq/L   Glucose, Bld 188 (*) 70 - 99 mg/dL   BUN 27 (*) 6 - 23 mg/dL   Creatinine, Ser 1.05  0.50 - 1.10 mg/dL   Calcium 9.2  8.4 - 10.5 mg/dL   GFR calc non Af Amer 57 (*) >90 mL/min   GFR calc Af Amer 66 (*) >90 mL/min     Assessment:  Ready for OR  Plan:  Diagnostic Laparoscopy.  The patient was interviewed and examined today.  The previously documented history and physical examination was reviewed. There are no changes. The operative procedure was reviewed. The risks and benefits were outlined again. The specific risks include, but are not limited to, anesthetic complications, bleeding, infections, and  possible damage to the surrounding organs. The patient's questions were answered.  We are ready to proceed as outlined. The likelihood of the patient achieving the goals of this procedure is very likely.  Gildardo Cranker M.D.  11/01/13 9:14 AM

## 2013-11-01 NOTE — Progress Notes (Signed)
[facility] PROGRESS NOTE  Abigail Johns NKN:[id number] DOB: 11/01/1950 DOA: 10/31/2013  PCP: Tivis Ringer, MD  Brief HPI: The patient is a 63 year old woman who was admitted by OB/GYN Dr. Raphael Gibney for right lower quadrant/pelvic abdominal pain. CT performed in the emergency department in March 2015 revealed a 7.7 cm simple cystic structure adjacent to the aspect of the vagina.Plan is to perform a diagnostic laparoscopy and laparoscopic removal of the pelvic mass. The patient's blood glucose has been uncontrolled as of late. At home, her blood sugars have been ranging in the 300 to 400s. She has had increased thirst and increased urination. She has had occasional blurred vision which had been relatively new for her. Her primary care physician, Dr. Dagmar Hait recently added Invokana approximately 6 or 7 weeks ago because her hemoglobin A1c had increased from 9 to greater than 11. She had been relatively controlled on glyburide alone for several years up until recently.  Past medical history:  Past Medical History  Diagnosis Date  . CHF (congestive heart failure)   . Hypertension   . High cholesterol   . Thyroid disease   . Diabetes mellitus   . Back pain, chronic   . Renal disorder   . Obesity   . OSA on CPAP   . Edema     Procedures: Laparoscopic removal of pelvic mass.  Subjective: Patient seen in recovery room after her surgery. She was still somewhat lethargic and slow to respond. But was able to answer my questions. Has some pain in abdomen but denies any nausea or vomiting.  Objective: Vital Signs  Filed Vitals:   11/01/13 1415 11/01/13 1430 11/01/13 1445 11/01/13 1500  BP: 151/73 142/74 140/54 127/55  Pulse: 80 82 77 80  Temp:      TempSrc:      Resp: 16 16 24 19   Height:      Weight:      SpO2: 100% 96% 99% 100%    Intake/Output Summary (Last 24 hours) at 11/01/13 1513 Last data filed at 11/01/13 1318  Gross per 24 hour  Intake 2861.3 ml  Output    775  ml  Net 2086.3 ml   Filed Weights   10/31/13 2022  Weight: 120.203 kg (265 lb)   General appearance: cooperative, appears stated age, no distress, moderately obese and slow to respond Resp: decreased air entry at bases without crackles or wheezing. Cardio: regular rate and rhythm, S1, S2 normal, no murmur, click, rub or gallop GI: bowel sounds heard. Not fully examined due to recent surgery. Extremities: extremities normal, atraumatic, no cyanosis or edema Neurologic: Awake. Oriented. Likely still under anesthetic effects. Moving all extremities. No focal deficits.  Lab Results:  Basic Metabolic Panel:  Recent Labs Lab 10/31/13 1940 11/01/13 0525  NA 141 135*  K 4.6 4.1  CL 100 97  CO2 27 24  GLUCOSE 209* 188*  BUN 29* 27*  CREATININE 1.34* 1.05  CALCIUM 9.8 9.2   Liver Function Tests:  Recent Labs Lab 10/31/13 1940  AST 19  ALT 28  ALKPHOS 117  BILITOT 0.3  PROT 6.8  ALBUMIN 3.7   CBC:  Recent Labs Lab 10/31/13 1940  WBC 7.4  HGB 13.8  HCT 41.5  MCV 96.7  PLT 268   CBG:  Recent Labs Lab 10/31/13 2100 11/01/13 0912 11/01/13 1200 11/01/13 1323 11/01/13 1501  GLUCAP 179* 219* 171* 210* 222*    Recent Results (from the past 240 hour(s))  MRSA PCR SCREENING  Status: None   Collection Time    10/31/13 10:05 PM      Result Value Ref Range Status   MRSA by PCR NEGATIVE  NEGATIVE Final   Comment:            The GeneXpert MRSA Assay (FDA     approved for NASAL specimens     only), is one component of a     comprehensive MRSA colonization     surveillance program. It is not     intended to diagnose MRSA     infection nor to guide or     monitor treatment for     MRSA infections.      Studies/Results: Dg Chest Port 1 View  11/01/2013   CLINICAL DATA:  Preop.  EXAM: PORTABLE CHEST - 1 VIEW  COMPARISON:  12/22/2011  FINDINGS: Lungs are hypoinflated without consolidation or effusion. Minimal linear scarring over the right base.  Cardiomediastinal silhouette and remainder of the exam is unchanged.  IMPRESSION: No active disease.   Electronically Signed   By: Marin Olp M.D.   On: 11/01/2013 00:37    Medications:  Scheduled: . allopurinol  300 mg Oral Daily  . baclofen  10 mg Oral Daily  . buPROPion  100 mg Oral TID  . colesevelam  1,875 mg Oral BID WC  . cycloSPORINE  1 drop Both Eyes BID  . docusate sodium  100 mg Oral BID  . ezetimibe  10 mg Oral Daily  . furosemide  80 mg Oral Daily  . gabapentin  600 mg Oral QHS  . heparin subcutaneous  5,000 Units Subcutaneous 3 times per day  . insulin aspart  0-15 Units Subcutaneous TID WC  . insulin aspart  0-5 Units Subcutaneous QHS  . insulin glargine  7 Units Subcutaneous QHS  . levothyroxine  75 mcg Oral QAC breakfast  . nebivolol  5 mg Oral Daily  . pantoprazole  40 mg Oral Daily  . potassium chloride SA  40 mEq Oral Daily  . spironolactone  50 mg Oral Daily   Continuous: . sodium chloride 70 mL/hr at 10/31/13 2205  . dextrose 5% lactated ringers     TIR:[id number], ibuprofen, metoCLOPramide, ondansetron (ZOFRAN) IV, ondansetron, oxyCODONE-acetaminophen, simethicone, temazepam  Assessment/Plan:  Principal Problem:   DM type 2 (diabetes mellitus, type 2) Active Problems:   OSA on CPAP   HTN (hypertension)   Morbid obesity   Pelvic mass   History of CHF (congestive heart failure)   Acute renal insufficiency   Unspecified hypothyroidism    Type 2 diabetes mellitus, non-insulin requiring The patient has had non-insulin requiring diabetes mellitus for more than 20 years. She was initially controlled on glyburide alone. Her PCP recently started another agent, Invokana, approximately 6 weeks ago when her hemoglobin A1c increased to greater than 11. She is only on Lantus currently due to NPO status and surgery. As she resumes her diet she may restart her oral agents and should follow up with her PCP.   History of CHF The patient was seen by  cardiologist, Dr. Debara Pickett on 4/7. She underwent a nuclear stress test in May 2014. The stress test was low risk and she had an ejection fraction of 73%. It appears that she may have diastolic dysfunction. She is treated chronically with Aldactone, Lasix, and Bystolic. These were held yesterday and resumed today. CXR does not show any active process.   Acute Renal insufficiency Creatinine is better today. Cut back on IF. She denies any  history of being told of chronic kidney disease.   Hypertension Currently stable on Bystolic. This should be continued.  Obstructive sleep apnea Would recommend continuing CPAP.  Hypothyroidism We'll continue Synthroid. TSH and free T4 are normal.  TR[facility] will continue to follow till discharge. Please call if there are any questions.  Thanks for the consult.   LOS: 1 day   [facility] Pager [phone] 11/01/2013, 3:13 PM  If 8PM-8AM, please contact night-coverage at [email or web address], password [email or web address]

## 2013-11-01 NOTE — Anesthesia Postprocedure Evaluation (Signed)
  Anesthesia Post-op Note  Patient: Restaurant manager, [employer]  Procedure(s) Performed: Procedure(s): LAPAROSCOPIC RIGHT OVARIAN CYSTECTOMY WITH COLLECTION OF WASHINGS  (Right) LAPAROSCOPIC LYSIS OF ADHESIONS (N/A) LAPAROSCOPY DIAGNOSTIC (N/A)  Patient Location: PACU  Anesthesia Type:General  Level of Consciousness: awake, alert  and oriented  Airway and Oxygen Therapy: Patient Spontanous Breathing  Post-op Pain: none  Post-op Assessment: Post-op Vital signs reviewed, Patient's Cardiovascular Status Stable, Respiratory Function Stable, Patent Airway, No signs of Nausea or vomiting and Pain level controlled  Post-op Vital Signs: Reviewed and stable  Last Vitals:  Filed Vitals:   11/01/13 1400  BP: 160/65  Pulse: 80  Temp:   Resp: 16    Complications: No apparent anesthesia complications

## 2013-11-01 NOTE — Op Note (Signed)
OPERATIVE NOTE  Abigail Johns  DOB:    December 25, 1950  MRN:    [id number]  CSN:    [id number]  Date of Surgery:  11/01/2013  Preoperative Diagnosis:  Pelvic mass (possible ovarian cysts) Pelvic pain Morbid obesity (BMI = 44) Hypertension Uncontrolled diabetes Congestive heart failure  Postoperative Diagnosis:  Pelvic mass (possible ovarian cysts) Pelvic pain Morbid obesity (BMI = 44) Hypertension Uncontrolled diabetes Congestive heart failure Pelvic adhesions  Procedure:  Diagnostic laparoscopy Laparoscopic lysis of adhesions Laparoscopic right ovarian cystectomy Laparoscopic pelvic washings  Surgeon:  Gildardo Cranker, M.D.  Assistant:  None  Anesthetic:  General  Disposition:  [name] is a 63 y.o. year old female who presents with the above diagnosis. She understands the indications for her operation as well as the alternative treatment options. She accepts the risk of, but not limited to, anesthetic complications, bleeding, infections, and possible damage to the surrounding organs.  Findings:  The uterus was surgically absent. The right ovary contained a 8 cm simple cyst. The left ovary could not be seen because of adhesions. Dense adhesions were present between the omentum and the anterior abdominal wall. There were also dense adhesions between the bowel and the left pelvic sidewall. There was no evidence of malignancy as we viewed the pelvic organs. There was a large amount of adipose tissue and the abdominal wall as well as within the peritoneal structures. The right fallopian tube appeared normal.  Procedure:  The patient was taken to the operating room where a general anesthetic was given. The patient's abdomen, perineum, and vagina were prepped with sterile solution. The bladder was drained of urine with a Foley catheter. An examination under anesthesia was performed. A padded sponge stick was placed in the vagina. The patient was  sterilely draped. The subumbilical area was injected with half percent Marcaine with epinephrine. A subumbilical incision was made and the the incision was extended through the subcutaneous tissue, the fascia, and the anterior peritoneum. The Hassan cannula was sutured into place. A pneumoperitoneum was obtained. The pelvic organs were inspected with findings as mentioned above. 2 areas of the mid abdomen were injected with Marcaine and epinephrine. Two 5 mm trochars were placed in the abdominal cavity under direct visualization. Pictures were taken of the patient's pelvic anatomy. Pelvic washings were obtained. The adhesions between the omentum and the anterior abdominal wall and some of the pelvic adhesions were removed using sharp and blunt dissection. Care was taken not to damage the vital structures. Because of the extensive adhesions between the bowel and the left pelvic sidewall, and because of the patient's multiple comorbidities, I decided to not explore the pelvis any further. I was not able to identify the left ovary nor the left fallopian tube. I decided to concentrate on the ovarian cyst. The cyst on the right ovary was elevated intraoperative field. Surgery was difficult because of the large amount of adipose tissue, and because it was difficult to maintain a pneumoperitoneum because of the patient's massive obesity. An incision was made in the capsule of the right ovary. A combination of sharp and blunt dissection were used to dissect the ovarian cyst from the ovarian tissue. During the course of dissection, the cyst did rupture. Clear fluid was noted. As much of the fluid as possible was collected and sent for evaluation. The cyst was removed in its entirety, and then the cyst wall was removed from the abdominal cavity through the umbilical port. Hemostasis was obtained in the ovarian stroma  using bipolar cautery. Hemostasis was noted to be adequate. The pelvis was vigorously irrigated. Again  hemostasis was adequate. The pneumoperitoneum was allowed to escape. All instruments were removed. The subumbilical fascia was closed using 0 Vicryl. The skin was reapproximated using 3-0 Monocryl. Sponge and needle counts were correct. The estimated blood loss was less than 100 cc. The patient tolerated her procedure well. She was awakened from her anesthetic without difficulty. She was transported to the recovery room in stable condition. The right ovarian cyst, the pelvic washings, and the cyst fluid were sent for evaluation.  Followup instructions:  I will admit the patient to the hospital overnight because of her comorbidities. Plan discharge tomorrow if she remains stable.  Gildardo Cranker, M.D.

## 2013-11-01 NOTE — Transfer of Care (Signed)
Immediate Anesthesia Transfer of Care Note  Patient: Abigail Johns  Procedure(s) Performed: Procedure(s): LAPAROSCOPIC RIGHT OVARIAN CYSTECTOMY WITH COLLECTION OF WASHINGS  (Right) LAPAROSCOPIC LYSIS OF ADHESIONS (N/A) LAPAROSCOPY DIAGNOSTIC (N/A)  Patient Location: PACU  Anesthesia Type:General  Level of Consciousness: awake and sedated  Airway & Oxygen Therapy: Patient Spontanous Breathing and Patient connected to face mask oxygen  Post-op Assessment: Report given to PACU RN and Post -op Vital signs reviewed and stable  Post vital signs: Reviewed and stable  Complications: No apparent anesthesia complications

## 2013-11-01 NOTE — Progress Notes (Signed)
Ur chart review completed.  

## 2013-11-01 NOTE — Anesthesia Preprocedure Evaluation (Addendum)
Anesthesia Evaluation  Patient identified by MRN, date of birth, ID band Patient awake    Reviewed: Allergy & Precautions, H&P , NPO status , Patient's Chart, lab work & pertinent test results  History of Anesthesia Complications Negative for: history of anesthetic complications  Airway Mallampati: II TM Distance: >3 FB Neck ROM: Full    Dental  (+) Teeth Intact   Pulmonary neg shortness of breath, sleep apnea and Continuous Positive Airway Pressure Ventilation , neg COPDneg recent URI, former smoker,  breath sounds clear to auscultation        Cardiovascular Exercise Tolerance: Poor hypertension, Pt. on medications and Pt. on home beta blockers - angina+CHF - Past MI and - DOE - dysrhythmias - Valvular Problems/MurmursRhythm:Regular Rate:Normal     Neuro/Psych Peripheral neuropathy, chronic low back pain negative psych ROS   GI/Hepatic negative GI ROS, Neg liver ROS,   Endo/Other  diabetes, Poorly Controlled, Type 2, Oral Hypoglycemic Agents, Insulin DependentHypothyroidism Morbid obesityHyperlipidemia  Renal/GU Renal InsufficiencyRenal disease  negative genitourinary   Musculoskeletal   Abdominal   Peds  Hematology negative hematology ROS (+)   Anesthesia Other Findings   Reproductive/Obstetrics Ovarian Cyst                         Anesthesia Physical Anesthesia Plan  ASA: III  Anesthesia Plan: General   Post-op Pain Management:    Induction: Intravenous  Airway Management Planned: Oral ETT  Additional Equipment: None  Intra-op Plan:   Post-operative Plan: Extubation in OR  Informed Consent: I have reviewed the patients History and Physical, chart, labs and discussed the procedure including the risks, benefits and alternatives for the proposed anesthesia with the patient or authorized representative who has indicated his/her understanding and acceptance.   Dental advisory  given  Plan Discussed with: CRNA, Anesthesiologist and Surgeon  Anesthesia Plan Comments:        Anesthesia Quick Evaluation

## 2013-11-02 ENCOUNTER — Encounter (HOSPITAL_COMMUNITY): Payer: Self-pay | Admitting: Obstetrics and Gynecology

## 2013-11-02 LAB — CBC
HCT: 34.2 % — ABNORMAL LOW (ref 36.0–46.0)
HEMOGLOBIN: 10.9 g/dL — AB (ref 12.0–15.0)
MCH: 31.4 pg (ref 26.0–34.0)
MCHC: 31.9 g/dL (ref 30.0–36.0)
MCV: 98.6 fL (ref 78.0–100.0)
Platelets: 226 10*3/uL (ref 150–400)
RBC: 3.47 MIL/uL — ABNORMAL LOW (ref 3.87–5.11)
RDW: 13.7 % (ref 11.5–15.5)
WBC: 8.8 10*3/uL (ref 4.0–10.5)

## 2013-11-02 LAB — BASIC METABOLIC PANEL
BUN: 17 mg/dL (ref 6–23)
CALCIUM: 8.5 mg/dL (ref 8.4–10.5)
CO2: 25 mEq/L (ref 19–32)
CREATININE: 0.98 mg/dL (ref 0.50–1.10)
Chloride: 100 mEq/L (ref 96–112)
GFR, EST AFRICAN AMERICAN: 71 mL/min — AB (ref >90)
GFR, EST NON AFRICAN AMERICAN: 61 mL/min — AB (ref >90)
Glucose, Bld: 216 mg/dL — ABNORMAL HIGH (ref 70–99)
POTASSIUM: 4.4 meq/L (ref 3.7–5.3)
Sodium: 135 mEq/L — ABNORMAL LOW (ref 137–147)

## 2013-11-02 LAB — GLUCOSE, CAPILLARY: Glucose-Capillary: 209 mg/dL — ABNORMAL HIGH (ref 70–99)

## 2013-11-02 MED ORDER — OXYCODONE-ACETAMINOPHEN 5-325 MG PO TABS: 1.0000 | ORAL_TABLET | ORAL | Status: DC | PRN

## 2013-11-02 MED ORDER — FERROUS SULFATE 325 (65 FE) MG PO TABS: 325.0000 mg | ORAL_TABLET | Freq: Two times a day (BID) | ORAL | Status: DC

## 2013-11-02 MED FILL — Heparin Sodium (Porcine) Inj 5000 Unit/ML: INTRAMUSCULAR | Qty: 1 | Status: AC

## 2013-11-02 NOTE — Discharge Instructions (Signed)
Anemia, Nonspecific Anemia is a condition in which the concentration of red blood cells or hemoglobin in the blood is below normal. Hemoglobin is a substance in red blood cells that carries oxygen to the tissues of the body. Anemia results in not enough oxygen reaching these tissues.  CAUSES  Common causes of anemia include:   Excessive bleeding. Bleeding may be internal or external. This includes excessive bleeding from periods (in women) or from the intestine.   Poor nutrition.   Chronic kidney, thyroid, and liver disease.  Bone marrow disorders that decrease red blood cell production.  Cancer and treatments for cancer.  HIV, AIDS, and their treatments.  Spleen problems that increase red blood cell destruction.  Blood disorders.  Excess destruction of red blood cells due to infection, medicines, and autoimmune disorders. SIGNS AND SYMPTOMS   Minor weakness.   Dizziness.   Headache.  Palpitations.   Shortness of breath, especially with exercise.   Paleness.  Cold sensitivity.  Indigestion.  Nausea.  Difficulty sleeping.  Difficulty concentrating. Symptoms may occur suddenly or they may develop slowly.  DIAGNOSIS  Additional blood tests are often needed. These help your health care provider determine the best treatment. Your health care provider will check your stool for blood and look for other causes of blood loss.  TREATMENT  Treatment varies depending on the cause of the anemia. Treatment can include:   Supplements of iron, vitamin Z61, or folic acid.   Hormone medicines.   A blood transfusion. This may be needed if blood loss is severe.   Hospitalization. This may be needed if there is significant continual blood loss.   Dietary changes.  Spleen removal. HOME CARE INSTRUCTIONS Keep all follow-up appointments. It often takes many weeks to correct anemia, and having your health care provider check on your condition and your response to  treatment is very important. SEEK IMMEDIATE MEDICAL CARE IF:   You develop extreme weakness, shortness of breath, or chest pain.   You become dizzy or have trouble concentrating.  You develop heavy vaginal bleeding.   You develop a rash.   You have bloody or black, tarry stools.   You faint.   You vomit up blood.   You vomit repeatedly.   You have abdominal pain.  You have a fever or persistent symptoms for more than 2 3 days.   You have a fever and your symptoms suddenly get worse.   You are dehydrated.  MAKE SURE YOU:  Understand these instructions.  Will watch your condition.  Will get help right away if you are not doing well or get worse. Document Released: 08/20/2004 Document Revised: 03/15/2013 Document Reviewed: 01/06/2013 [facility] Patient Information 2014 [place]. Diagnostic Laparoscopy Laparoscopy is a surgical procedure. It is used to diagnose and treat diseases inside the belly (abdomen). It is usually a brief, common, and relatively simple procedure. The laparoscopeis a thin, lighted, pencil-sized instrument. It is like a telescope. It is inserted into your abdomen through a small cut (incision). Your caregiver can look at the organs inside your body through this instrument. He or she can see if there is anything abnormal. Laparoscopy can be done either in a hospital or outpatient clinic. You may be given a mild sedative to help you relax before the procedure. Once in the operating room, you will be given a drug to make you sleep (general anesthesia). Laparoscopy usually lasts less than 1 hour. After the procedure, you will be monitored in a recovery area until you  are stable and doing well. Once you are home, it will take 2 to 3 days to fully recover. RISKS AND COMPLICATIONS  Laparoscopy has relatively few risks. Your caregiver will discuss the risks with you before the procedure. Some problems that can occur  include:  Infection.  Bleeding.  Damage to other organs.  Anesthetic side effects. PROCEDURE Once you receive anesthesia, your surgeon inflates the abdomen with a harmless gas (carbon dioxide). This makes the organs easier to see. The laparoscope is inserted into the abdomen through a small incision. This allows your surgeon to see into the abdomen. Other small instruments are also inserted into the abdomen through other small openings. Many surgeons attach a video camera to the laparoscope to enlarge the view. During a diagnostic laparoscopy, the surgeon may be looking for inflammation, infection, or cancer. Your surgeon may take tissue samples(biopsies). The samples are sent to a specialist in looking at cells and tissue samples (pathologist). The pathologist examines them under a microscope. Biopsies can help to diagnose or confirm a disease. AFTER THE PROCEDURE   The gas is released from inside the abdomen.  The incisions are closed with stitches (sutures). Because these incisions are small (usually less than 1/2 inch), there is usually minimal discomfort after the procedure. There may be some mild discomfort in the throat. This is from the tube placed in the throat while you were sleeping. You may have some mild abdominal discomfort. There may also be discomfort from the instrument placement incisions in the abdomen.  The recovery time is shortened as long as there are no complications.  You will rest in a recovery room until stable and doing well. As long as there are no complications, you may be allowed to go home. FINDING OUT THE RESULTS OF YOUR TEST Not all test results are available during your visit. If your test results are not back during the visit, make an appointment with your caregiver to find out the results. Do not assume everything is normal if you have not heard from your caregiver or the medical facility. It is important for you to follow up on all of your test  results. HOME CARE INSTRUCTIONS   Take all medicines as directed.  Only take over-the-counter or prescription medicines for pain, discomfort, or fever as directed by your caregiver.  Resume daily activities as directed.  Showers are preferred over baths.  You may resume sexual activities in 1 week or as directed.  Do not drive while taking narcotics. SEEK MEDICAL CARE IF:   There is increasing abdominal pain.  There is new pain in the shoulders (shoulder strap areas).  You feel lightheaded or faint.  You have the chills.  You or your child has an oral temperature above 102 F (38.9 C).  There is pus-like (purulent) drainage from any of the wounds.  You are unable to pass gas or have a bowel movement.  You feel sick to your stomach (nauseous) or throw up (vomit). MAKE SURE YOU:   Understand these instructions.  Will watch your condition.  Will get help right away if you are not doing well or get worse. Document Released: 10/19/2000 Document Revised: 11/07/2012 Document Reviewed: 07/13/2007 [facility] Patient Information 2014 [place], [place].

## 2013-11-02 NOTE — Discharge Summary (Signed)
Physician Discharge Summary  Patient ID: Abigail Johns MRN: [id number] DOB/AGE: 61-Jan-1952 61 y.o.  Admit date:         10/31/2013 Discharge date: 11/02/2013  Admission Diagnoses:  Pelvic mass (possible ovarian cyst)  Pelvic pain believed to be associated with her pelvic mass  Poorly-controlled insulin-dependent diabetes  Morbid obesity  Hypertension  Dyslipidemia  Obstructive sleep apnea  Possible mild pulmonary hypertension  Chronic lower extremity edema  congestive heart failure  chronic back pain  Thyroid disease   Discharge Diagnoses:   OVarian Cyst-Right  Pelvic pain believed to be associated with her pelvic mass  Poorly-controlled insulin-dependent diabetes  Morbid obesity  Hypertension  Dyslipidemia  Obstructive sleep apnea  Possible mild pulmonary hypertension  Chronic lower extremity edema  congestive heart failure  chronic back pain  Thyroid disease  Pelvic adhesions Anemia  Procedures this Admission:  10/31/2013  Procedure(s) (LRB): LAPAROSCOPIC RIGHT OVARIAN CYSTECTOMY WITH COLLECTION OF WASHINGS  (Right) LAPAROSCOPIC LYSIS OF ADHESIONS (N/A) LAPAROSCOPY DIAGNOSTIC (N/A)  Discharged Condition: fair   Admission Hx and PE: The patient has been followed at the [facility]. She has a history of  OVarian Cyst. Please see her documented history and physical exam. A CT scan showed a 7.7 cm simple cystic pelvic mass.  The patient was having severe pain. Her CA 125 equals 7.5.  She has multiple comorbidities.  She was seen by a cardiologist and her diabetes Dr. Prior to admission.  Her hemoglobin A1c was 11.1.  Her blood sugar was 400 in the office.  The decision was made to admit the patient to hospital so that we can optimize her preoperative care with the hope that we could minimize the risk of a bad outcome from her anesthetic and her procedure.  Hospital course:  On the day of admission, the patient  was started on insulin and monitored carefully.  She was seen by the hospitalist as well as the anesthesia team.  She was given pain venous thrombosis prophylaxis both pharmacologic and mechanically.  On November 01, 2013, the patient underwent the following Procedure(s): LAPAROSCOPIC RIGHT OVARIAN CYSTECTOMY WITH COLLECTION OF WASHINGS  LAPAROSCOPIC LYSIS OF ADHESIONS LAPAROSCOPY DIAGNOSTIC. Operative findings included pelvic adhesions and a 7.7 cm cystic right ovarian mass. Steep Trendelenburg positioning was required.  She tolerated her procedure well.  She was monitored closely postoperatively because of our concern for her blood sugars and our concern for her fluid mobilization. Her postoperative course was uneventful. She quickly tolerated a regular diet. Her postoperative pain was controlled with oral medication. She remained afebrile. Today she was felt to be ready for discharge.  Labs:  Hemoglobin  Date Value Ref Range Status  11/02/2013 10.9* 12.0 - 15.0 g/dL Final     DELTA CHECK NOTED     REPEATED TO VERIFY     HCT  Date Value Ref Range Status  11/02/2013 34.2* 36.0 - 46.0 % Final   Results for orders placed during the hospital encounter of 10/31/13 (from the past 24 hour(s))  GLUCOSE, CAPILLARY     Status: Abnormal   Collection Time    11/01/13  3:58 PM      Result Value Ref Range   Glucose-Capillary 198 (*) 70 - 99 mg/dL  GLUCOSE, CAPILLARY     Status: Abnormal   Collection Time    11/01/13  9:08 PM      Result Value Ref Range   Glucose-Capillary 165 (*) 70 - 99 mg/dL  CBC  Status: Abnormal   Collection Time    11/02/13  5:30 AM      Result Value Ref Range   WBC 8.8  4.0 - 10.5 K/uL   RBC 3.47 (*) 3.87 - 5.11 MIL/uL   Hemoglobin 10.9 (*) 12.0 - 15.0 g/dL   HCT 34.2 (*) 36.0 - 46.0 %   MCV 98.6  78.0 - 100.0 fL   MCH 31.4  26.0 - 34.0 pg   MCHC 31.9  30.0 - 36.0 g/dL   RDW 13.7  11.5 - 15.5 %   Platelets 226  150 - 400 K/uL  BASIC METABOLIC PANEL     Status: Abnormal    Collection Time    11/02/13  5:30 AM      Result Value Ref Range   Sodium 135 (*) 137 - 147 mEq/L   Potassium 4.4  3.7 - 5.3 mEq/L   Chloride 100  96 - 112 mEq/L   CO2 25  19 - 32 mEq/L   Glucose, Bld 216 (*) 70 - 99 mg/dL   BUN 17  6 - 23 mg/dL   Creatinine, Ser 0.98  0.50 - 1.10 mg/dL   Calcium 8.5  8.4 - 10.5 mg/dL   GFR calc non Af Amer 61 (*) >90 mL/min   GFR calc Af Amer 71 (*) >90 mL/min  GLUCOSE, CAPILLARY     Status: Abnormal   Collection Time    11/02/13  8:10 AM      Result Value Ref Range   Glucose-Capillary 209 (*) 70 - 99 mg/dL   Comment 1 Documented in Chart     Comment 2 Notify RN      Consults: hospitalist and anesthesia  Final pathology report: Pending at the time of discharge  Disposition:  The patient will be discharged to home. She has been given a copy of the discharge instructions as prepared by the [facility] for patients who have undergone the Procedure(s): LAPAROSCOPIC RIGHT OVARIAN CYSTECTOMY WITH COLLECTION OF [place].    Future Appointments [facility] Department Dept Phone   11/28/2013 9:15 AM Sanda Klein, MD [facility] [phone]       Medication List         allopurinol 300 MG tablet  Commonly known as:  ZYLOPRIM  Take 300 mg by mouth daily.     baclofen 10 MG tablet  Commonly known as:  LIORESAL  Take 10 mg by mouth daily.     buPROPion 100 MG tablet  Commonly known as:  WELLBUTRIN  Take 100 mg by mouth 3 (three) times daily.     colesevelam 625 MG tablet  Commonly known as:  WELCHOL  Take 1,875 mg by mouth 2 (two) times daily with a meal.     cycloSPORINE 0.05 % ophthalmic emulsion  Commonly known as:  RESTASIS  Place 1 drop into both eyes 2 (two) times daily.     ezetimibe 10 MG tablet  Commonly known as:  ZETIA  Take 10 mg by mouth daily.     ferrous sulfate 325 (65 FE) MG tablet  Commonly known as:  FERROUSUL  Take 1  tablet (325 mg total) by mouth 2 (two) times daily with a meal.     furosemide 80 MG tablet  Commonly known as:  LASIX  Take 80 mg by mouth.     gabapentin 600 MG tablet  Commonly known as:  NEURONTIN  Take 600 mg by mouth at bedtime.  glyBURIDE 5 MG tablet  Commonly known as:  DIABETA  Take 5 mg by mouth daily with supper.     HYDROcodone-acetaminophen 5-325 MG per tablet  Commonly known as:  NORCO/VICODIN  Take 1-2 tablets by mouth every 6 (six) hours as needed.     INVOKANA 300 MG Tabs  Generic drug:  Canagliflozin  Take 1 tablet by mouth daily.     levothyroxine 75 MCG tablet  Commonly known as:  SYNTHROID, LEVOTHROID  Take 75 mcg by mouth daily.     meloxicam 15 MG tablet  Commonly known as:  MOBIC  Take 15 mg by mouth daily.     methylphenidate 36 MG CR tablet  Commonly known as:  CONCERTA  Take 36 mg by mouth every morning.     naproxen sodium 220 MG tablet  Commonly known as:  ANAPROX  Take 220 mg by mouth daily as needed (used for headache.).     nebivolol 5 MG tablet  Commonly known as:  BYSTOLIC  Take 5 mg by mouth daily.     nystatin-triamcinolone cream  Commonly known as:  MYCOLOG II  as needed.     oxyCODONE-acetaminophen 5-325 MG per tablet  Commonly known as:  ROXICET  Take 1 tablet by mouth every 4 (four) hours as needed for severe pain.     pantoprazole 40 MG tablet  Commonly known as:  PROTONIX  Take 40 mg by mouth daily.     potassium chloride SA 20 MEQ tablet  Commonly known as:  K-DUR,KLOR-CON  Take 40 mEq by mouth daily.     spironolactone 50 MG tablet  Commonly known as:  ALDACTONE  Take 50 mg by mouth daily.     temazepam 30 MG capsule  Commonly known as:  RESTORIL  Take 30 mg by mouth at bedtime as needed for sleep.     Vitamin D (Ergocalciferol) 50000 UNITS Caps capsule  Commonly known as:  DRISDOL  Take 50,000 Units by mouth 2 (two) times a week. Mondays and fridays           Follow-up Information   Follow up  with Eli Hose, MD In 2 weeks.   Specialty:  Obstetrics and Gynecology   Contact information:   [address] [place] [address] [phone]       Signed: Ena Dawley 11/02/2013, 3:19 PM

## 2013-11-13 ENCOUNTER — Other Ambulatory Visit: Payer: Self-pay | Admitting: *Deleted

## 2013-11-13 MED ORDER — FUROSEMIDE 80 MG PO TABS: 80.0000 mg | ORAL_TABLET | Freq: Every day | ORAL | Status: DC

## 2013-11-28 ENCOUNTER — Ambulatory Visit (INDEPENDENT_AMBULATORY_CARE_PROVIDER_SITE_OTHER): Payer: BC Managed Care – PPO | Admitting: Cardiovascular Disease

## 2013-11-28 ENCOUNTER — Encounter: Payer: Self-pay | Admitting: Cardiovascular Disease

## 2013-11-28 VITALS — BP 118/54 | HR 82 | Resp 16 | Ht 65.0 in | Wt 252.9 lb

## 2013-11-28 DIAGNOSIS — E7800 Pure hypercholesterolemia, unspecified: Secondary | ICD-10-CM

## 2013-11-28 DIAGNOSIS — G4733 Obstructive sleep apnea (adult) (pediatric): Secondary | ICD-10-CM

## 2013-11-28 DIAGNOSIS — I10 Essential (primary) hypertension: Secondary | ICD-10-CM

## 2013-11-28 DIAGNOSIS — Z8679 Personal history of other diseases of the circulatory system: Secondary | ICD-10-CM

## 2013-11-28 DIAGNOSIS — Z9989 Dependence on other enabling machines and devices: Secondary | ICD-10-CM

## 2013-11-28 NOTE — Patient Instructions (Signed)
Your physician recommends that you schedule a follow-up appointment in: 6 MONTHS with Dr.Croitoru

## 2013-11-29 ENCOUNTER — Encounter: Payer: Self-pay | Admitting: Cardiovascular Disease

## 2013-11-29 DIAGNOSIS — E7800 Pure hypercholesterolemia, unspecified: Secondary | ICD-10-CM | POA: Insufficient documentation

## 2013-11-29 NOTE — Assessment & Plan Note (Signed)
Good control

## 2013-11-29 NOTE — Progress Notes (Signed)
Patient ID: Abigail Johns, female   DOB: 04-Jun-1951, 63 y.o.   MRN: [id number]     Reason for office visit Severe hyperlipidemia, obstructive sleep apnea, mild pulmonary hypertension, systemic hypertension  Abigail Johns is now 63 years old. She is here for yearly followup accompanied by her mother who is also our patient.  She has long-standing problems with severe obesity, insulin resistant diabetes mellitus, severe hyperlipidemia intolerant to statins, systemic hypertension, obstructive sleep apnea, mild pulmonary hypertension and chronic lower extreme edema. She is a former smoker. She has never undergone coronary angiography. Shadow normal nuclear stress test in May of 2014. She does not have known peripheral atherosclerosis and has never had a stroke or transient ischemic attack. We have tried to treat her severe hypercholesterolemia with a variety of agents and has been intolerant to all statins even to once weekly low-dose Crestor. She is currently taking a combination of WelChol and Zetia, but her LDL cholesterol remains severely elevated.  She recently underwent laparoscopic surgery for an ovarian tumor. It sounds like this was a relatively simple benign cyst. She has had difficulty with poor wound healing, even though the procedure was laparoscopic. Her last hemoglobin A1c was 11.1%. Glucose is often severely elevated and she perceives this with dizziness and blurry vision. This is occurring to some moderate degree today.   Allergies  Allergen Reactions  . Lipitor [Atorvastatin Calcium] Other (See Comments)    Myocitis  . Crestor [Rosuvastatin]     myalgias    Current Outpatient Prescriptions  Medication Sig Dispense Refill  . allopurinol (ZYLOPRIM) 300 MG tablet Take 300 mg by mouth daily.      . baclofen (LIORESAL) 10 MG tablet Take 10 mg by mouth daily.      Marland Kitchen buPROPion (WELLBUTRIN) 100 MG tablet Take 100 mg by mouth 3 (three) times daily.      . Canagliflozin (INVOKANA) 300  MG TABS Take 1 tablet by mouth daily.      . colesevelam (WELCHOL) 625 MG tablet Take 1,875 mg by mouth 2 (two) times daily with a meal.      . cycloSPORINE (RESTASIS) 0.05 % ophthalmic emulsion Place 1 drop into both eyes 2 (two) times daily.      Marland Kitchen ezetimibe (ZETIA) 10 MG tablet Take 10 mg by mouth daily.      . ferrous sulfate (FERROUSUL) 325 (65 FE) MG tablet Take 1 tablet (325 mg total) by mouth 2 (two) times daily with a meal.  100 tablet  1  . furosemide (LASIX) 80 MG tablet Take 80 mg by mouth daily.      Marland Kitchen gabapentin (NEURONTIN) 600 MG tablet Take 600 mg by mouth at bedtime.      Marland Kitchen glyBURIDE (DIABETA) 5 MG tablet Take 5 mg by mouth daily with supper.      Marland Kitchen LEVEMIR FLEXTOUCH 100 UNIT/ML Pen Inject 22 Units into the skin every evening. 8 units qam and 22 units qpm.      . levothyroxine (SYNTHROID, LEVOTHROID) 75 MCG tablet Take 75 mcg by mouth daily.      . meloxicam (MOBIC) 15 MG tablet Take 15 mg by mouth daily.      . methylphenidate (CONCERTA) 36 MG CR tablet Take 36 mg by mouth every morning.      . naproxen sodium (ANAPROX) 220 MG tablet Take 220 mg by mouth daily as needed (used for headache.).      Marland Kitchen nebivolol (BYSTOLIC) 5 MG tablet Take 5 mg by mouth daily.      Marland Kitchen  nystatin-triamcinolone (MYCOLOG II) cream as needed.      . pantoprazole (PROTONIX) 40 MG tablet Take 40 mg by mouth daily.      . potassium chloride SA (K-DUR,KLOR-CON) 20 MEQ tablet Take 40 mEq by mouth daily.       Marland Kitchen spironolactone (ALDACTONE) 50 MG tablet Take 50 mg by mouth daily.      . temazepam (RESTORIL) 30 MG capsule Take 30 mg by mouth at bedtime as needed for sleep.      . Vitamin D, Ergocalciferol, (DRISDOL) 50000 UNITS CAPS Take 50,000 Units by mouth 2 (two) times a week. Mondays and fridays       No current facility-administered medications for this visit.    Past Medical History  Diagnosis Date  . CHF (congestive heart failure)   . Hypertension   . High cholesterol   . Thyroid disease   .  Diabetes mellitus   . Back pain, chronic   . Renal disorder   . Obesity   . OSA on CPAP   . Edema     Past Surgical History  Procedure Laterality Date  . Nephrolithotomy      Right  . Abdominal hysterectomy    . Eye surgery    . Bunionectomy      both feet  . Left knee surgery      Torn meniscus  . US echocardiography  12/24/2011    mild LVH,mild TR,trace MR,PI  . Laparoscopic ovarian cystectomy Right 11/01/2013    Procedure: LAPAROSCOPIC RIGHT OVARIAN CYSTECTOMY WITH COLLECTION OF WASHINGS ;  Surgeon: Ena Dawley, MD;  Location: [facility];  Service: Gynecology;  Laterality: Right;  . Laparoscopic lysis of adhesions N/A 11/01/2013    Procedure: LAPAROSCOPIC LYSIS OF ADHESIONS;  Surgeon: Ena Dawley, MD;  Location: [facility];  Service: Gynecology;  Laterality: N/A;  . Laparoscopy N/A 11/01/2013    Procedure: LAPAROSCOPY DIAGNOSTIC;  Surgeon: Ena Dawley, MD;  Location: [facility];  Service: Gynecology;  Laterality: N/A;    Family History  Problem Relation Age of Onset  . Diabetes Mother   . Hyperlipidemia Mother   . Hypertension Mother   . Hypertension Father   . Diabetes Father   . Cancer Father   . Heart attack Brother   . Hyperlipidemia Brother   . Hypertension Brother     History   Social History  . Marital Status: Divorced    Spouse Name: N/A    Number of Children: N/A  . Years of Education: N/A   Occupational History  . Not on file.   Social History Main Topics  . Smoking status: Former Smoker    Quit date: 07/26/2002  . Smokeless tobacco: Never Used  . Alcohol Use: No  . Drug Use: No  . Sexual Activity: Not on file   Other Topics Concern  . Not on file   Social History Narrative  . No narrative on file    Review of systems: The patient specifically denies any chest pain at rest or with exertion, dyspnea at rest or with exertion, orthopnea, paroxysmal nocturnal dyspnea, syncope, palpitations, focal neurological deficits, intermittent claudication,  lower extremity edema, unexplained weight gain, cough, hemoptysis or wheezing.  The patient also denies abdominal pain, nausea, vomiting, dysphagia, diarrhea, constipation, polyuria, polydipsia, dysuria, hematuria, frequency, urgency, abnormal bleeding or bruising, fever, chills, unexpected weight changes, mood swings, change in skin or hair texture, change in voice quality, auditory or visual problems, allergic reactions or rashes, new musculoskeletal complaints other than usual "aches  and pains".   PHYSICAL EXAM BP 118/54  Pulse 82  Resp 16  Ht 5\' 5"  (1.651 m)  Wt 252 lb 14.4 oz (114.715 kg)  BMI 42.08 kg/m2  General: Alert, oriented x3, no distress, orbit and obese Head: no evidence of trauma, PERRL, EOMI, no exophtalmos or lid lag, no myxedema, no xanthelasma; normal ears, nose and oropharynx Neck: normal jugular venous pulsations and no hepatojugular reflux; brisk carotid pulses without delay and no carotid bruits Chest: clear to auscultation, no signs of consolidation by percussion or palpation, normal fremitus, symmetrical and full respiratory excursions Cardiovascular: unable to locate the apical impulse, regular rhythm, normal first and second heart sounds, no murmurs, rubs or gallops Abdomen: no tenderness or distention, no masses by palpation, no abnormal pulsatility or arterial bruits, normal bowel sounds, no hepatosplenomegaly Extremities: no clubbing, cyanosis or edema; 2+ radial, ulnar and brachial pulses bilaterally; 2+ right femoral, posterior tibial and dorsalis pedis pulses; 2+ left femoral, posterior tibial and dorsalis pedis pulses; no subclavian or femoral bruits Neurological: grossly nonfocal   EKG:  sinus rhythm, normal tracing   BMET    Component Value Date/Time   NA 135* 11/02/2013 0530   K 4.4 11/02/2013 0530   CL 100 11/02/2013 0530   CO2 25 11/02/2013 0530   GLUCOSE 216* 11/02/2013 0530   BUN 17 11/02/2013 0530   CREATININE 0.98 11/02/2013 0530   CALCIUM 8.5  11/02/2013 0530   GFRNONAA 61* 11/02/2013 0530   GFRAA 71* 11/02/2013 0530     ASSESSMENT AND PLAN HTN (hypertension) Good control  Hypercholesteremia Despite welchol and zetia, LDL cholesterol remains around 240 and she also has poorly controlled diabetes mellitus and a family history of coronary artery disease. Hopefully we will soon havePCSK9 meters available. She is not a candidate for clinical trial since she does not have established coronary disease. Continue the same medical therapy for now. Need to focus on better glycemic control.   Morbid obesity Weight loss is very important  History of CHF (congestive heart failure) Normal left ventricular systolic function by echo in 2013, there is a questionable diagnosis of pulmonary hypertension (systolic PA pressure was 29 mm Hg on the echo from 2013)  OSA on CPAP     Patient Instructions  Your physician recommends that you schedule a follow-up appointment in: 6 MONTHS with Dr.Zayden Maffei      No orders of the defined types were placed in this encounter.   Meds ordered this encounter  Medications  . furosemide (LASIX) 80 MG tablet    Sig: Take 80 mg by mouth daily.  Marland Kitchen LEVEMIR FLEXTOUCH 100 UNIT/ML Pen    Sig: Inject 22 Units into the skin every evening. 8 units qam and 22 units qpm.    Jamesmichael Shadd  Sanda Klein, MD, [facility] [phone] office [phone] pager

## 2013-11-29 NOTE — Assessment & Plan Note (Signed)
Weight loss is very important

## 2013-11-29 NOTE — Assessment & Plan Note (Signed)
Normal left ventricular systolic function by echo in 2013, there is a questionable diagnosis of pulmonary hypertension (systolic PA pressure was 29 mm Hg on the echo from 2013)

## 2013-11-29 NOTE — Assessment & Plan Note (Signed)
Despite welchol and zetia, LDL cholesterol remains around 240 and she also has poorly controlled diabetes mellitus and a family history of coronary artery disease. Hopefully we will soon havePCSK9 meters available. She is not a candidate for clinical trial since she does not have established coronary disease. Continue the same medical therapy for now. Need to focus on better glycemic control.

## 2014-05-08 ENCOUNTER — Emergency Department (HOSPITAL_COMMUNITY): Payer: BC Managed Care – PPO

## 2014-05-08 ENCOUNTER — Inpatient Hospital Stay (HOSPITAL_COMMUNITY): Payer: BC Managed Care – PPO

## 2014-05-08 ENCOUNTER — Encounter (HOSPITAL_COMMUNITY): Payer: Self-pay | Admitting: Emergency Medicine

## 2014-05-08 ENCOUNTER — Inpatient Hospital Stay (HOSPITAL_COMMUNITY)
Admission: EM | Admit: 2014-05-08 | Discharge: 2014-05-11 | DRG: 065 | Disposition: A | Discharge status: Home Health | Payer: BC Managed Care – PPO | Attending: Internal Medicine | Admitting: Internal Medicine

## 2014-05-08 DIAGNOSIS — Z9119 Patient's noncompliance with other medical treatment and regimen: Secondary | ICD-10-CM | POA: Diagnosis present

## 2014-05-08 DIAGNOSIS — R569 Unspecified convulsions: Secondary | ICD-10-CM | POA: Diagnosis present

## 2014-05-08 DIAGNOSIS — Z833 Family history of diabetes mellitus: Secondary | ICD-10-CM

## 2014-05-08 DIAGNOSIS — E039 Hypothyroidism, unspecified: Secondary | ICD-10-CM | POA: Diagnosis present

## 2014-05-08 DIAGNOSIS — E119 Type 2 diabetes mellitus without complications: Secondary | ICD-10-CM

## 2014-05-08 DIAGNOSIS — I252 Old myocardial infarction: Secondary | ICD-10-CM

## 2014-05-08 DIAGNOSIS — I69351 Hemiplegia and hemiparesis following cerebral infarction affecting right dominant side: Secondary | ICD-10-CM

## 2014-05-08 DIAGNOSIS — I69392 Facial weakness following cerebral infarction: Secondary | ICD-10-CM | POA: Diagnosis not present

## 2014-05-08 DIAGNOSIS — Z79899 Other long term (current) drug therapy: Secondary | ICD-10-CM

## 2014-05-08 DIAGNOSIS — N390 Urinary tract infection, site not specified: Secondary | ICD-10-CM | POA: Diagnosis present

## 2014-05-08 DIAGNOSIS — H5441 Blindness, right eye, normal vision left eye: Secondary | ICD-10-CM | POA: Diagnosis present

## 2014-05-08 DIAGNOSIS — G8929 Other chronic pain: Secondary | ICD-10-CM | POA: Diagnosis present

## 2014-05-08 DIAGNOSIS — Z888 Allergy status to other drugs, medicaments and biological substances status: Secondary | ICD-10-CM

## 2014-05-08 DIAGNOSIS — Z794 Long term (current) use of insulin: Secondary | ICD-10-CM

## 2014-05-08 DIAGNOSIS — Z87891 Personal history of nicotine dependence: Secondary | ICD-10-CM | POA: Diagnosis not present

## 2014-05-08 DIAGNOSIS — I69398 Other sequelae of cerebral infarction: Secondary | ICD-10-CM | POA: Diagnosis not present

## 2014-05-08 DIAGNOSIS — I10 Essential (primary) hypertension: Secondary | ICD-10-CM

## 2014-05-08 DIAGNOSIS — Z8249 Family history of ischemic heart disease and other diseases of the circulatory system: Secondary | ICD-10-CM

## 2014-05-08 DIAGNOSIS — Z7982 Long term (current) use of aspirin: Secondary | ICD-10-CM | POA: Diagnosis not present

## 2014-05-08 DIAGNOSIS — K760 Fatty (change of) liver, not elsewhere classified: Secondary | ICD-10-CM | POA: Diagnosis present

## 2014-05-08 DIAGNOSIS — I639 Cerebral infarction, unspecified: Secondary | ICD-10-CM | POA: Diagnosis present

## 2014-05-08 DIAGNOSIS — M109 Gout, unspecified: Secondary | ICD-10-CM | POA: Diagnosis present

## 2014-05-08 DIAGNOSIS — R4182 Altered mental status, unspecified: Secondary | ICD-10-CM | POA: Diagnosis present

## 2014-05-08 DIAGNOSIS — I2510 Atherosclerotic heart disease of native coronary artery without angina pectoris: Secondary | ICD-10-CM | POA: Diagnosis present

## 2014-05-08 DIAGNOSIS — I63412 Cerebral infarction due to embolism of left middle cerebral artery: Secondary | ICD-10-CM

## 2014-05-08 DIAGNOSIS — Z6839 Body mass index (BMI) 39.0-39.9, adult: Secondary | ICD-10-CM

## 2014-05-08 DIAGNOSIS — G4733 Obstructive sleep apnea (adult) (pediatric): Secondary | ICD-10-CM | POA: Diagnosis present

## 2014-05-08 DIAGNOSIS — E785 Hyperlipidemia, unspecified: Secondary | ICD-10-CM | POA: Diagnosis present

## 2014-05-08 DIAGNOSIS — I63519 Cerebral infarction due to unspecified occlusion or stenosis of unspecified middle cerebral artery: Principal | ICD-10-CM | POA: Diagnosis present

## 2014-05-08 DIAGNOSIS — E7800 Pure hypercholesterolemia, unspecified: Secondary | ICD-10-CM

## 2014-05-08 DIAGNOSIS — I509 Heart failure, unspecified: Secondary | ICD-10-CM | POA: Diagnosis present

## 2014-05-08 DIAGNOSIS — M549 Dorsalgia, unspecified: Secondary | ICD-10-CM | POA: Diagnosis present

## 2014-05-08 DIAGNOSIS — R29810 Facial weakness: Secondary | ICD-10-CM | POA: Diagnosis present

## 2014-05-08 DIAGNOSIS — E669 Obesity, unspecified: Secondary | ICD-10-CM | POA: Diagnosis present

## 2014-05-08 DIAGNOSIS — I69320 Aphasia following cerebral infarction: Secondary | ICD-10-CM | POA: Diagnosis not present

## 2014-05-08 DIAGNOSIS — Z8679 Personal history of other diseases of the circulatory system: Secondary | ICD-10-CM

## 2014-05-08 DIAGNOSIS — H5411 Blindness, right eye, low vision left eye: Secondary | ICD-10-CM | POA: Diagnosis present

## 2014-05-08 DIAGNOSIS — R109 Unspecified abdominal pain: Secondary | ICD-10-CM

## 2014-05-08 DIAGNOSIS — R4701 Aphasia: Secondary | ICD-10-CM | POA: Diagnosis present

## 2014-05-08 LAB — PROTIME-INR
INR: 1.17 (ref 0.00–1.49)
Prothrombin Time: 14.9 seconds (ref 11.6–15.2)

## 2014-05-08 LAB — CBC
HCT: 43.5 % (ref 36.0–46.0)
Hemoglobin: 14.7 g/dL (ref 12.0–15.0)
MCH: 31.5 pg (ref 26.0–34.0)
MCHC: 33.8 g/dL (ref 30.0–36.0)
MCV: 93.1 fL (ref 78.0–100.0)
PLATELETS: 270 10*3/uL (ref 150–400)
RBC: 4.67 MIL/uL (ref 3.87–5.11)
RDW: 14 % (ref 11.5–15.5)
WBC: 6.6 10*3/uL (ref 4.0–10.5)

## 2014-05-08 LAB — COMPREHENSIVE METABOLIC PANEL
ALK PHOS: 109 U/L (ref 39–117)
ALT: 32 U/L (ref 0–35)
ANION GAP: 14 (ref 5–15)
AST: 24 U/L (ref 0–37)
Albumin: 3.7 g/dL (ref 3.5–5.2)
BILIRUBIN TOTAL: 0.4 mg/dL (ref 0.3–1.2)
BUN: 14 mg/dL (ref 6–23)
CHLORIDE: 102 meq/L (ref 96–112)
CO2: 23 mEq/L (ref 19–32)
Calcium: 9.7 mg/dL (ref 8.4–10.5)
Creatinine, Ser: 0.75 mg/dL (ref 0.50–1.10)
GFR calc Af Amer: >90 mL/min (ref >90)
GFR calc non Af Amer: 90 mL/min — ABNORMAL LOW (ref >90)
Glucose, Bld: 140 mg/dL — ABNORMAL HIGH (ref 70–99)
POTASSIUM: 4.3 meq/L (ref 3.7–5.3)
SODIUM: 139 meq/L (ref 137–147)
TOTAL PROTEIN: 7.4 g/dL (ref 6.0–8.3)

## 2014-05-08 LAB — DIFFERENTIAL
Basophils Absolute: 0 10*3/uL (ref 0.0–0.1)
Basophils Relative: 0 % (ref 0–1)
EOS ABS: 0.4 10*3/uL (ref 0.0–0.7)
Eosinophils Relative: 6 % — ABNORMAL HIGH (ref 0–5)
LYMPHS ABS: 2.3 10*3/uL (ref 0.7–4.0)
Lymphocytes Relative: 34 % (ref 12–46)
MONOS PCT: 6 % (ref 3–12)
Monocytes Absolute: 0.4 10*3/uL (ref 0.1–1.0)
NEUTROS ABS: 3.6 10*3/uL (ref 1.7–7.7)
NEUTROS PCT: 54 % (ref 43–77)

## 2014-05-08 LAB — URINE MICROSCOPIC-ADD ON

## 2014-05-08 LAB — URINALYSIS, ROUTINE W REFLEX MICROSCOPIC
Bilirubin Urine: NEGATIVE
Glucose, UA: NEGATIVE mg/dL
Hgb urine dipstick: NEGATIVE
KETONES UR: NEGATIVE mg/dL
NITRITE: NEGATIVE
PROTEIN: NEGATIVE mg/dL
Specific Gravity, Urine: 1.012 (ref 1.005–1.030)
Urobilinogen, UA: 0.2 mg/dL (ref 0.0–1.0)
pH: 5 (ref 5.0–8.0)

## 2014-05-08 LAB — I-STAT CHEM 8, ED
BUN: 14 mg/dL (ref 6–23)
CALCIUM ION: 1.15 mmol/L (ref 1.13–1.30)
Chloride: 105 mEq/L (ref 96–112)
Creatinine, Ser: 0.9 mg/dL (ref 0.50–1.10)
Glucose, Bld: 139 mg/dL — ABNORMAL HIGH (ref 70–99)
HCT: 47 % — ABNORMAL HIGH (ref 36.0–46.0)
Hemoglobin: 16 g/dL — ABNORMAL HIGH (ref 12.0–15.0)
Potassium: 4 mEq/L (ref 3.7–5.3)
Sodium: 138 mEq/L (ref 137–147)
TCO2: 23 mmol/L (ref 0–100)

## 2014-05-08 LAB — CBG MONITORING, ED: GLUCOSE-CAPILLARY: 133 mg/dL — AB (ref 70–99)

## 2014-05-08 LAB — I-STAT TROPONIN, ED: Troponin i, poc: 0 ng/mL (ref 0.00–0.08)

## 2014-05-08 LAB — RAPID URINE DRUG SCREEN, HOSP PERFORMED
Amphetamines: NOT DETECTED
BARBITURATES: NOT DETECTED
BENZODIAZEPINES: NOT DETECTED
Cocaine: NOT DETECTED
Opiates: NOT DETECTED
Tetrahydrocannabinol: NOT DETECTED

## 2014-05-08 LAB — APTT: APTT: 26 s (ref 24–37)

## 2014-05-08 LAB — ETHANOL: Alcohol, Ethyl (B): <11 mg/dL (ref 0–11)

## 2014-05-08 MED ORDER — FLUTICASONE FUROATE 27.5 MCG/SPRAY NA SUSP: 2.0000 | Freq: Every day | NASAL | Status: DC

## 2014-05-08 MED ORDER — METHYLPHENIDATE HCL ER (OSM) 18 MG PO TBCR
36.0000 mg | EXTENDED_RELEASE_TABLET | Freq: Every day | ORAL | Status: DC
  Filled 2014-05-08: qty 2

## 2014-05-08 MED ORDER — CYCLOSPORINE 0.05 % OP EMUL
1.0000 [drp] | Freq: Two times a day (BID) | OPHTHALMIC | Status: DC
  Administered 2014-05-09 (×2): 1 [drp] via OPHTHALMIC
  Administered 2014-05-09: 11:00:00 via OPHTHALMIC
  Administered 2014-05-10 – 2014-05-11 (×4): 1 [drp] via OPHTHALMIC
  Filled 2014-05-08 (×7): qty 1

## 2014-05-08 MED ORDER — EZETIMIBE 10 MG PO TABS
10.0000 mg | ORAL_TABLET | Freq: Every day | ORAL | Status: DC
  Filled 2014-05-08: qty 1

## 2014-05-08 MED ORDER — ASPIRIN 300 MG RE SUPP
300.0000 mg | Freq: Every day | RECTAL | Status: DC
  Administered 2014-05-09: 300 mg via RECTAL
  Filled 2014-05-08: qty 1

## 2014-05-08 MED ORDER — SODIUM CHLORIDE 0.9 % IV SOLN
INTRAVENOUS | Status: AC
  Administered 2014-05-08 – 2014-05-09 (×2): via INTRAVENOUS

## 2014-05-08 MED ORDER — SENNOSIDES-DOCUSATE SODIUM 8.6-50 MG PO TABS
1.0000 | ORAL_TABLET | Freq: Every evening | ORAL | Status: DC | PRN
  Filled 2014-05-08: qty 1

## 2014-05-08 MED ORDER — COLESEVELAM HCL 625 MG PO TABS
1875.0000 mg | ORAL_TABLET | Freq: Two times a day (BID) | ORAL | Status: DC
  Filled 2014-05-08 (×3): qty 3

## 2014-05-08 MED ORDER — ASPIRIN 325 MG PO TABS
325.0000 mg | ORAL_TABLET | Freq: Every day | ORAL | Status: DC
  Filled 2014-05-08: qty 1

## 2014-05-08 MED ORDER — STROKE: EARLY STAGES OF RECOVERY BOOK
Freq: Once | Status: AC
  Administered 2014-05-09: 01:00:00
  Filled 2014-05-08: qty 1

## 2014-05-08 MED ORDER — INSULIN ASPART 100 UNIT/ML ~~LOC~~ SOLN: 0.0000 [IU] | Freq: Three times a day (TID) | SUBCUTANEOUS | Status: DC

## 2014-05-08 MED ORDER — FLUTICASONE PROPIONATE 50 MCG/ACT NA SUSP
2.0000 | Freq: Every day | NASAL | Status: DC
  Administered 2014-05-09 – 2014-05-11 (×3): 2 via NASAL
  Filled 2014-05-08 (×2): qty 16

## 2014-05-08 MED ORDER — LEVOTHYROXINE SODIUM 100 MCG IV SOLR
37.5000 ug | Freq: Every day | INTRAVENOUS | Status: DC
  Administered 2014-05-09: 11:00:00 via INTRAVENOUS
  Filled 2014-05-08: qty 5

## 2014-05-08 MED ORDER — ENOXAPARIN SODIUM 40 MG/0.4ML ~~LOC~~ SOLN
40.0000 mg | SUBCUTANEOUS | Status: DC
  Administered 2014-05-09 – 2014-05-10 (×2): 40 mg via SUBCUTANEOUS
  Filled 2014-05-08 (×2): qty 0.4

## 2014-05-08 MED ORDER — GLYBURIDE 5 MG PO TABS
5.0000 mg | ORAL_TABLET | Freq: Every day | ORAL | Status: DC
  Filled 2014-05-08: qty 1

## 2014-05-08 MED ORDER — PANTOPRAZOLE SODIUM 40 MG PO TBEC: 40.0000 mg | DELAYED_RELEASE_TABLET | Freq: Every day | ORAL | Status: DC

## 2014-05-08 MED ORDER — NEBIVOLOL HCL 5 MG PO TABS
5.0000 mg | ORAL_TABLET | Freq: Every day | ORAL | Status: DC
  Filled 2014-05-08: qty 1

## 2014-05-08 MED ORDER — FERROUS SULFATE 325 (65 FE) MG PO TABS
325.0000 mg | ORAL_TABLET | Freq: Every day | ORAL | Status: DC
  Filled 2014-05-08 (×2): qty 1

## 2014-05-08 MED ORDER — ALLOPURINOL 300 MG PO TABS
400.0000 mg | ORAL_TABLET | Freq: Every day | ORAL | Status: DC
  Filled 2014-05-08: qty 1

## 2014-05-08 MED ORDER — BUPROPION HCL 100 MG PO TABS
100.0000 mg | ORAL_TABLET | Freq: Three times a day (TID) | ORAL | Status: DC
  Filled 2014-05-08 (×3): qty 1

## 2014-05-08 NOTE — ED Provider Notes (Signed)
CSN: [id number]     Arrival date & time 05/08/14  1432 History   First MD Initiated Contact with Patient 05/08/14 1510     Chief Complaint  Patient presents with  . Code Stroke      HPI  Pt presents with decreased LOC.  CVA August 2015.  Rehab at [facility] rehab, home 2 weeks.  Recovered speech and resolved hemiparesis.  Today, was seen by daughter this am at baseline.  Aunt came to house, as well as HHCRN.  Rn called daughter to report decreased LOC.  No stereotypical sz activity noted by Rn, Family, or EMS.    Past Medical History  Diagnosis Date  . CHF (congestive heart failure)   . Hypertension   . High cholesterol   . Thyroid disease   . Diabetes mellitus   . Back pain, chronic   . Renal disorder   . Obesity   . OSA on CPAP   . Edema    Past Surgical History  Procedure Laterality Date  . Nephrolithotomy      Right  . Abdominal hysterectomy    . Eye surgery    . Bunionectomy      both feet  . Left knee surgery      Torn meniscus  . US echocardiography  12/24/2011    mild LVH,mild TR,trace MR,PI  . Laparoscopic ovarian cystectomy Right 11/01/2013    Procedure: LAPAROSCOPIC RIGHT OVARIAN CYSTECTOMY WITH COLLECTION OF WASHINGS ;  Surgeon: Ena Dawley, MD;  Location: [place] [facility];  Service: Gynecology;  Laterality: Right;  . Laparoscopic lysis of adhesions N/A 11/01/2013    Procedure: LAPAROSCOPIC LYSIS OF ADHESIONS;  Surgeon: Ena Dawley, MD;  Location: [facility];  Service: Gynecology;  Laterality: N/A;  . Laparoscopy N/A 11/01/2013    Procedure: LAPAROSCOPY DIAGNOSTIC;  Surgeon: Ena Dawley, MD;  Location: [facility];  Service: Gynecology;  Laterality: N/A;   Family History  Problem Relation Age of Onset  . Diabetes Mother   . Hyperlipidemia Mother   . Hypertension Mother   . Hypertension Father   . Diabetes Father   . Cancer Father   . Heart attack Brother   . Hyperlipidemia Brother   . Hypertension Brother    History  Substance Use Topics  . Smoking  status: Former Smoker    Quit date: 07/26/2002  . Smokeless tobacco: Never Used  . Alcohol Use: No   OB History   Grav Para Term Preterm Abortions TAB SAB Ect Mult Living                 Review of Systems  Unable to perform ROS: Mental status change      Allergies  Lipitor and Crestor  Home Medications   Prior to Admission medications   Medication Sig Start Date End Date Taking? Authorizing Provider  allopurinol (ZYLOPRIM) 300 MG tablet Take 400 mg by mouth daily.    Yes Historical Provider, MD  buPROPion (WELLBUTRIN) 100 MG tablet Take 100 mg by mouth 3 (three) times daily.   Yes Historical Provider, MD  Canagliflozin (INVOKANA) 300 MG TABS Take 1 tablet by mouth daily.   Yes Historical Provider, MD  colesevelam (WELCHOL) 625 MG tablet Take 1,875 mg by mouth 2 (two) times daily with a meal.   Yes Historical Provider, MD  cycloSPORINE (RESTASIS) 0.05 % ophthalmic emulsion Place 1 drop into both eyes 2 (two) times daily.   Yes Historical Provider, MD  ezetimibe (ZETIA) 10 MG tablet Take 10 mg by  mouth daily.   Yes Historical Provider, MD  ferrous sulfate 325 (65 FE) MG tablet Take 325 mg by mouth daily with breakfast.   Yes Historical Provider, MD  fluticasone (VERAMYST) 27.5 MCG/SPRAY nasal spray Place 2 sprays into the nose daily.   Yes Historical Provider, MD  furosemide (LASIX) 80 MG tablet Take 80 mg by mouth daily. 11/13/13  Yes Mihai Croitoru, MD  gabapentin (NEURONTIN) 600 MG tablet Take 600 mg by mouth at bedtime.   Yes Historical Provider, MD  glyBURIDE (DIABETA) 5 MG tablet Take 5 mg by mouth daily with supper.   Yes Historical Provider, MD  LEVEMIR FLEXTOUCH 100 UNIT/ML Pen Inject 30 Units into the skin every evening. 10 units qam and 30 units qpm. 11/15/13  Yes Historical Provider, MD  levothyroxine (SYNTHROID, LEVOTHROID) 75 MCG tablet Take 75 mcg by mouth daily.   Yes Historical Provider, MD  lidocaine (LIDODERM) 5 % Place 1 patch onto the skin every 12 (twelve) hours.  Remove & Discard patch within 12 hours or as directed by MD   Yes Historical Provider, MD  meloxicam (MOBIC) 15 MG tablet Take 15 mg by mouth daily.   Yes Historical Provider, MD  metFORMIN (GLUCOPHAGE) 500 MG tablet Take 500 mg by mouth 2 (two) times daily with a meal.   Yes Historical Provider, MD  methylphenidate (CONCERTA) 36 MG CR tablet Take 36 mg by mouth every morning.   Yes Historical Provider, MD  nebivolol (BYSTOLIC) 5 MG tablet Take 5 mg by mouth daily.   Yes Historical Provider, MD  pantoprazole (PROTONIX) 40 MG tablet Take 40 mg by mouth daily.   Yes Historical Provider, MD  potassium chloride SA (K-DUR,KLOR-CON) 20 MEQ tablet Take 40 mEq by mouth daily.    Yes Historical Provider, MD  spironolactone (ALDACTONE) 50 MG tablet Take 50 mg by mouth daily.   Yes Historical Provider, MD  temazepam (RESTORIL) 30 MG capsule Take 30 mg by mouth at bedtime as needed for sleep.   Yes Historical Provider, MD  Vitamin D, Ergocalciferol, (DRISDOL) 50000 UNITS CAPS Take 50,000 Units by mouth 2 (two) times a week. Mondays and fridays   Yes Historical Provider, MD   BP 109/59  Pulse 70  Resp 18  SpO2 95% Physical Exam  Constitutional: She appears well-developed and well-nourished.  HENT:  Head: Normocephalic.  Mouth/Throat: Oropharynx is clear and moist.  No droop  Eyes:  Prosthetic right eye.  Lt eye 70mm.  Opens eyes to voice, non verbal.    Cardiovascular:  sinus  Pulmonary/Chest: She has no decreased breath sounds. She has no wheezes. She has no rhonchi. She has no rales.  Abdominal: Bowel sounds are normal.  Neurological:  No movement to voice, or pinch to extremities x 4.  Non verbal.  Spontaneous respirations.  Skin: Skin is warm and dry. She is not diaphoretic. No pallor.    ED Course  Procedures (including critical care time) Labs Review Labs Reviewed  DIFFERENTIAL - Abnormal; Notable for the following:    Eosinophils Relative 6 (*)    All other components within normal  limits  COMPREHENSIVE METABOLIC PANEL - Abnormal; Notable for the following:    Glucose, Bld 140 (*)    GFR calc non Af Amer 90 (*)    All other components within normal limits  URINALYSIS, ROUTINE W REFLEX MICROSCOPIC - Abnormal; Notable for the following:    APPearance CLOUDY (*)    Leukocytes, UA MODERATE (*)    All other components within  normal limits  URINE MICROSCOPIC-ADD ON - Abnormal; Notable for the following:    Squamous Epithelial / LPF FEW (*)    Bacteria, UA MANY (*)    Casts HYALINE CASTS (*)    All other components within normal limits  I-STAT CHEM 8, ED - Abnormal; Notable for the following:    Glucose, Bld 139 (*)    Hemoglobin 16.0 (*)    HCT 47.0 (*)    All other components within normal limits  CBG MONITORING, ED - Abnormal; Notable for the following:    Glucose-Capillary 133 (*)    All other components within normal limits  ETHANOL  PROTIME-INR  APTT  CBC  URINE RAPID DRUG SCREEN (HOSP PERFORMED)  I-STAT TROPOININ, ED  I-STAT TROPOININ, ED    Imaging Review Ct Head Wo Contrast  05/08/2014   ADDENDUM REPORT: 05/08/2014 14:57  ADDENDUM: Critical Value/emergent results were called by telephone at the time of interpretation on 05/08/2014 at 2:56 pm to Dr. Leonel Ramsay, neurology,who verbally acknowledged these results. It should be noted that the patient by report had a CVA approximately 1 month ago. It is possible that the apparent hemorrhage in the left temporal region actually represents calcification as a reactive response to prior infarct. It is possible that there is both calcification and hemorrhage in this area. Given this circumstance, MR could be most helpful for further assessment with respect to these differential considerations.   Electronically Signed   By: Lowella Grip M.D.   On: 05/08/2014 14:57   05/08/2014   CLINICAL DATA:  Patient with acute onset aphasia ; reported CVA with right-sided weakness 1 month prior  EXAM: CT HEAD WITHOUT CONTRAST   TECHNIQUE: Contiguous axial images were obtained from the base of the skull through the vertex without intravenous contrast.  COMPARISON:  None.  FINDINGS: There is mild diffuse atrophy. There is hemorrhage tracking along the periphery of the parenchyma of the superior left temporal lobe. This hemorrhage is not causing appreciable mass effect. There is no midline shift. There is no mass appreciable.  There is a small infarct in the anterior limb of the left internal capsule which may be acute. There is patchy small vessel disease in the centra semiovale bilaterally. Elsewhere gray-white compartments appear normal.  There is a prosthetic globe on the right. Bony calvarium appears intact. The mastoid air cells are clear.  IMPRESSION: There is hemorrhage in the parenchyma of the lateral left temporal lobe. This appearance raises question of amyloid angiography. There is a questionable acute small infarct in the anterior limb of the left internal capsule. There is atrophy with patchy periventricular small vessel disease. There is no subdural or epidural fluid. 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  Electronically Signed: By: Lowella Grip M.D. On: 05/08/2014 14:53   Mr Virgel Paling Wo Contrast  05/08/2014   CLINICAL DATA:  63 year old female history of stroke 1 month ago who presents with decreased level of consciousness. Suspected LEFT hemisphere acute on chronic infarction. Stroke risk factors include hypertension, diabetes, and hypercholesterolemia.  EXAM: MRI HEAD WITHOUT CONTRAST  MRA HEAD WITHOUT CONTRAST  TECHNIQUE: Multiplanar, multiecho pulse sequences of the brain and surrounding structures were obtained without intravenous contrast. Angiographic images of the head were obtained using MRA technique without contrast.  COMPARISON:  CT head earlier today.  FINDINGS: MRI HEAD FINDINGS  Moderately extensive restricted diffusion affects much of the centrum semiovale on the LEFT, extending from the subcortical  frontal white matter to the parietal region, involving basal  ganglia and insular region as well.  No restricted diffusion or blood products in the region of the LEFT temporal cortex abnormality displayed on CT. There is brain substance loss in this region, T2 and FLAIR hyperintensity, without lobar hemorrhage, and the CT findings likely reflect mineralization related to a subacute insult. There is T1 shortening affecting the LEFT basal ganglia, particularly the caudate, and loss of brain substance, with asymmetric enlargement of the frontal horn LEFT lateral ventricle, consistent with a subacute to chronic insult.  Generalized atrophy. Chronic microvascular ischemic change. Flow voids are maintained. No midline abnormality. Prosthetic RIGHT globe. Trace mastoid effusions. No acute sinus disease.  MRA HEAD FINDINGS  The internal carotid arteries are widely patent. The LEFT is slightly smaller. The basilar artery is widely patent with both vertebrals contributing, LEFT greater than RIGHT.  There is a severe stenosis of the M1 segment LEFT middle cerebral artery estimated 90% or greater. No similar lesion on the RIGHT. No flow reducing lesion of the anterior or posterior cerebral arteries. No cerebellar branch occlusion. No intracranial aneurysm.  IMPRESSION: Moderately extensive acute LEFT subcortical white matter infarction.  Abnormal LEFT temporal lobe on CT consistent with mineralization of a subacute insult. No parenchymal hemorrhage is evident.  Severe 90% LEFT MCA M1 segment stenosis correlates with the observed pattern(s) of ischemia.   Electronically Signed   By: Rolla Flatten M.D.   On: 05/08/2014 20:38   Mr Brain Wo Contrast  05/08/2014   CLINICAL DATA:  63 year old female history of stroke 1 month ago who presents with decreased level of consciousness. Suspected LEFT hemisphere acute on chronic infarction. Stroke risk factors include hypertension, diabetes, and hypercholesterolemia.  EXAM: MRI HEAD  WITHOUT CONTRAST  MRA HEAD WITHOUT CONTRAST  TECHNIQUE: Multiplanar, multiecho pulse sequences of the brain and surrounding structures were obtained without intravenous contrast. Angiographic images of the head were obtained using MRA technique without contrast.  COMPARISON:  CT head earlier today.  FINDINGS: MRI HEAD FINDINGS  Moderately extensive restricted diffusion affects much of the centrum semiovale on the LEFT, extending from the subcortical frontal white matter to the parietal region, involving basal ganglia and insular region as well.  No restricted diffusion or blood products in the region of the LEFT temporal cortex abnormality displayed on CT. There is brain substance loss in this region, T2 and FLAIR hyperintensity, without lobar hemorrhage, and the CT findings likely reflect mineralization related to a subacute insult. There is T1 shortening affecting the LEFT basal ganglia, particularly the caudate, and loss of brain substance, with asymmetric enlargement of the frontal horn LEFT lateral ventricle, consistent with a subacute to chronic insult.  Generalized atrophy. Chronic microvascular ischemic change. Flow voids are maintained. No midline abnormality. Prosthetic RIGHT globe. Trace mastoid effusions. No acute sinus disease.  MRA HEAD FINDINGS  The internal carotid arteries are widely patent. The LEFT is slightly smaller. The basilar artery is widely patent with both vertebrals contributing, LEFT greater than RIGHT.  There is a severe stenosis of the M1 segment LEFT middle cerebral artery estimated 90% or greater. No similar lesion on the RIGHT. No flow reducing lesion of the anterior or posterior cerebral arteries. No cerebellar branch occlusion. No intracranial aneurysm.  IMPRESSION: Moderately extensive acute LEFT subcortical white matter infarction.  Abnormal LEFT temporal lobe on CT consistent with mineralization of a subacute insult. No parenchymal hemorrhage is evident.  Severe 90% LEFT MCA M1  segment stenosis correlates with the observed pattern(s) of ischemia.   Electronically Signed  By: Rolla Flatten M.D.   On: 05/08/2014 20:38     EKG Interpretation   Date/Time:  Tuesday May 08 2014 14:51:56 EDT Ventricular Rate:  72 PR Interval:  199 QRS Duration: 100 QT Interval:  383 QTC Calculation: 419 R Axis:   49 Text Interpretation:  Sinus rhythm Low voltage, precordial leads Confirmed  by Jeneen Rinks  MD, Pen Mar ([id number]) on 05/08/2014 4:05:43 PM      MDM   Final diagnoses:  Stroke    Pt with decreased LOC.  CVA August 2015.  Ct today with density likely 2/2 prior cva, vs hemorrhage.  Plan is EEG to r/o subclinical status, and MRI.  Pt seen in room with Dr. Leonel Ramsay.   20:50:  Patient regaining some verbal abilities. Still an incomplete aphasia. MRI shows extensive left subcortical white matter infarct no hemorrhage noted.  I placed a call hospitalist regarding admission.    Tanna Furry, MD 05/08/14 2053

## 2014-05-08 NOTE — ED Notes (Signed)
EEG tech at bedside. 

## 2014-05-08 NOTE — ED Notes (Signed)
Hx of right sided weakness from stroke 1 month ago--was treated at [facility].

## 2014-05-08 NOTE — Progress Notes (Signed)
EEG completed, results pending. 

## 2014-05-08 NOTE — ED Notes (Signed)
Pt is on a dysphagia diet.

## 2014-05-08 NOTE — Consult Note (Signed)
Neurology Consultation Reason for Consult: AMS Referring Physician: Dr. Jeneen Rinks  CC: AMS  History is obtained from: records, EMS, and daughter  HPI: Abigail Johns is a 63 y.o. female pmh of HTN, CHF, HLD, thyroid disease, right glass eye, OSA, and recent stroke in 9/15 at [facility] in [place], [place] that left her with residual right side hemiparesis. Pt had apparently been fully functional and independent before stroke last month. Pt was then transferred to a rehab center in [facility] and had been able to perform some simple ADLs but still had continued right sided hemiparesis before coming home for a couple of weeks but had normal speech and mentation. The daughter reports that her mother had been complaining of some vague abdominal pain for the previous 2 days that wasn't resolved with alka seltzer. The daughter left for work at 45AM and stated her mother was in her normal state of health but still complaining of some abdominal pain. Apparently when the pt sister and nurse came to care for the patient she was not responsive. The didn't note any urinary or stool incontinence or random movements. The patient had apparently taken all her medications that AM and she wasn't on any anticoagulation or ASA. EMS was then called by the pt's daughter where she continued to be non responsive, with right sided hemiparesis, and some rapid eye movement. Good clinical exam was difficult as pt altered to the point of inability to participate in exam for EMS. No frank seizure like activity seen. Code stroke was called.   LKW: 0730 tpa given?: no, out of window and recent stroke.   ROS: Unable to obtain due to altered mental status.   Past Medical History  Diagnosis Date  . CHF (congestive heart failure)   . Hypertension   . High cholesterol   . Thyroid disease   . Diabetes mellitus   . Back pain, chronic   . Renal disorder   . Obesity   . OSA on CPAP   . Edema    Family History:  strong for DM, HTN, HLD and MI Social History: Tob: former smoker of 0.5 ppd quit in 2003  No current facility-administered medications on file prior to encounter.   Current Outpatient Prescriptions on File Prior to Encounter  Medication Sig Dispense Refill  . allopurinol (ZYLOPRIM) 300 MG tablet Take 400 mg by mouth daily.       Marland Kitchen buPROPion (WELLBUTRIN) 100 MG tablet Take 100 mg by mouth 3 (three) times daily.      . Canagliflozin (INVOKANA) 300 MG TABS Take 1 tablet by mouth daily.      . colesevelam (WELCHOL) 625 MG tablet Take 1,875 mg by mouth 2 (two) times daily with a meal.      . cycloSPORINE (RESTASIS) 0.05 % ophthalmic emulsion Place 1 drop into both eyes 2 (two) times daily.      Marland Kitchen ezetimibe (ZETIA) 10 MG tablet Take 10 mg by mouth daily.      . furosemide (LASIX) 80 MG tablet Take 80 mg by mouth daily.      Marland Kitchen gabapentin (NEURONTIN) 600 MG tablet Take 600 mg by mouth at bedtime.      Marland Kitchen glyBURIDE (DIABETA) 5 MG tablet Take 5 mg by mouth daily with supper.      Marland Kitchen LEVEMIR FLEXTOUCH 100 UNIT/ML Pen Inject 30 Units into the skin every evening. 10 units qam and 30 units qpm.      . levothyroxine (SYNTHROID, LEVOTHROID) 75  MCG tablet Take 75 mcg by mouth daily.      . meloxicam (MOBIC) 15 MG tablet Take 15 mg by mouth daily.      . methylphenidate (CONCERTA) 36 MG CR tablet Take 36 mg by mouth every morning.      . pantoprazole (PROTONIX) 40 MG tablet Take 40 mg by mouth daily.      . potassium chloride SA (K-DUR,KLOR-CON) 20 MEQ tablet Take 40 mEq by mouth daily.       Marland Kitchen spironolactone (ALDACTONE) 50 MG tablet Take 50 mg by mouth daily.      . temazepam (RESTORIL) 30 MG capsule Take 30 mg by mouth at bedtime as needed for sleep.      . Vitamin D, Ergocalciferol, (DRISDOL) 50000 UNITS CAPS Take 50,000 Units by mouth 2 (two) times a week. Mondays and fridays        Exam: General: Mental Status: Alert, altered and only responsive to painful stimuli. Pt not speaking and unable follow  simple commands. Cranial Nerves: II: Discs flat left eye; Visual fields unable to assess, rightward gaze preference, pupils cross midline, pupils equal, round, reactive to light and accommodation on left, glass eye on right III,IV, VI: ptosis not present, will blink to threat on left eye  V,VII: no facial asymmetry, hard to assess sensation as pt altered IX,X: gag reflex present XI: bilateral shoulder shrug not performed given AMS XII: midline tongue  Motor: Tone and bulk:normal tone throughout; no atrophy noted, pt able to hold left upper extremity against gravity  minimally   Sensory: pt withdrawals to painful stimuli  Deep Tendon Reflexes:  Right: Upper Extremity   Left: Upper extremity   biceps (C-5 to C-6) 2/4   biceps (C-5 to C-6) 2/4 tricep (C7) 2/4    triceps (C7) 2/4 Brachioradialis (C6) 2/4  Brachioradialis (C6) 2/4  Lower Extremity Lower Extremity  quadriceps (L-2 to L-4) 2/4   quadriceps (L-2 to L-4) 2/4 Achilles (S1) 2/4   Achilles (S1) 2/4  Plantars: Right: downgoing   Left: downgoing  CV: pulses palpable throughout, RRR Resp: CTAB no crackles, wheezes or rhonchi Abd: soft, nt, nondistended, no organomegaly Skin: areas of erythema and powder under breasts, no other rashes or lesions or areas of breakdown  I have reviewed labs in epic and the results pertinent to this consultation are: CBG and glucose 140 CBC unremarkable EtOH <11 UDS still pending istat troponin: negative UA pending  I have reviewed the images obtained:  EKG: NSR, HR 70s  Initial CT head with some regions of density concern for hemorrhage. This was then personally reviewed with Radiology where etiology could represent subacute stroke with some petechial bleeding vs early mineralization from previous known CVA more likely than acute hemorraghic stroke.   Impression: Abigail Johns 63 yo woman extensive pmh for HTN, CHF, DM and recent stroke (presumed ischemic) p/w AMS 2/2 to possible new acute  stroke or seizure activity.   Recommendations: 1) stat EEG before initiating antiepileptics  2) MRI for further characterization 3) pt is not a tpa candidate given recent acute stroke and outside tpa window 4) obtain records from [facility] center on previous admission for acute stroke   Pt was seen and exaimed with Dr. Starling Manns, MD  IM resident  PGY-3 Pgr: [phone]  I have seen and evaluated the patient. I have reviewed the above note and agree with the content.   63 yo F with a history of stroke one month ago  who presents with decreased level of responsiveness. She was recovering well from her stroke until this morning when she was seen at 7:30am still alert. She was then later found to be less responsive and therefore was transported to [facility].   Here a head CT shows gyriform hyperdensity which I feel is most consistent with calcification of a previous infarct though some pethechial hemorrhage is not excluded. I do not think it represents de novo hemorrhage. She is not coagulopathic and not hypertensive.   On exam, she appears to look to the right, but does cross midline to the left. She does not clearly follow commands. She blinks to threat from the left, but not right. She does move all extremities voluntarily.   I suspect that she has had a new infarct, but given the unusual presentation, will rule out partial  status epilepticus with EEG.   Roland Rack, MD [facility] [phone]  If 7pm- 7am, please page neurology on call as listed in [place].

## 2014-05-08 NOTE — ED Notes (Signed)
MRI notified.

## 2014-05-08 NOTE — Procedures (Signed)
History: 63 yo F with altered mental status  Sedation: None  Technique: This is a 17 channel routine scalp EEG performed at the bedside with bipolar and monopolar montages arranged in accordance to the international 10/20 system of electrode placement. One channel was dedicated to EKG recording.    Background: There is a well defined posterior dominant rhythm of 9 Hz that attenuates with eye opening. There is mild irregular delta activity that is most prominent in teh left frontotemporal region, F7,T7 > F3, Fp1.  Photic stimulation: Physiologic driving is not performed.  EEG Abnormalities: 1) Focal left frontotemporal slowing  Clinical Interpretation: This EEG demonstrates evidence of a left frontotemporal cerebral dysfunction that is etiologically nonspecific. There was no seizure or seizure predisposition recorded on this study.   Roland Rack, MD [facility] [phone]  If 7pm- 7am, please page neurology on call as listed in [place].

## 2014-05-08 NOTE — ED Notes (Signed)
Checked patient cbg it was 2 notified RN Santiago Glad of blood sugar

## 2014-05-08 NOTE — Code Documentation (Signed)
63yo female arriving to [facility] at 1432 via [place].  Patient was LKW at 0730 this morning when her daughter left for work.  Patient has a h/o stroke 1 month ago while on vacation in [place].  Patient had residual right sided weakness and went to rehab in [facility].  Per daughter patient was able to ambulate post discharge from rehab.  Patient's nurse went to check on patient today at 1400 and found her unresponsive.  EMS activated Code Stroke.  Patient taken to CT on arrival.  Stroke team to bedside.  NIHSS 30, see documentation for details and code stroke times.  Patient is contraindicated for treatment with tPA d/t recent stroke.  Patient to have EEG and MRI.  MRI notified by Stroke RN.  Bedside handoff with ED RN Santiago Glad.

## 2014-05-08 NOTE — H&P (Signed)
[facility] History and Physical  Abigail Johns HER:[id number] DOB: [birth year] DOA: 05/08/2014  Referring physician: ER physician. PCP: Tivis Ringer, MD   History obtained from ER physician and patient's daughter.  Chief Complaint: Altered mental status.  HPI: Abigail Johns is a 63 y.o. female with history of recent CVA last month and presently in rehabilitation was brought to the ER after patient's daughter found the patient was altered mental status and difficult to arouse. As per patient's daughter patient was doing fine when she left her in the morning 7:30 AM following which around 9 AM patient went to sleep. At around 1 PM patient's caregiver was trying to wake her up and was found that patient was difficult to arouse and patient was brought to the ER. CT head showed possible uptake in from with possible hemorrhage. On-call neurologist Dr. Leonel Ramsay was consulted and patient also had EEG to make sure patient was not in status. Eventually MRI of the brain was done which showed large left MCA stroke. Patient has been admitted for further management. As per the daughter patient has become more alert awake after 7 PM. At this time patient still has some fascia which patient states is from a previous stroke. Patient also has mild right-sided weakness. Patient has a daughter was complaining of left lower quadrant pain since last night. Patient did not have any nausea vomiting or diarrhea. Patient did not have any fever chills or did not complain of any chest pain or shortness of breath.   Review of Systems: As presented in the history of presenting illness, rest negative.  Past Medical History  Diagnosis Date  . CHF (congestive heart failure)   . Hypertension   . High cholesterol   . Thyroid disease   . Diabetes mellitus   . Back pain, chronic   . Renal disorder   . Obesity   . OSA on CPAP   . Edema    Past Surgical History  Procedure Laterality Date  .  Nephrolithotomy      Right  . Abdominal hysterectomy    . Eye surgery    . Bunionectomy      both feet  . Left knee surgery      Torn meniscus  . US echocardiography  12/24/2011    mild LVH,mild TR,trace MR,PI  . Laparoscopic ovarian cystectomy Right 11/01/2013    Procedure: LAPAROSCOPIC RIGHT OVARIAN CYSTECTOMY WITH COLLECTION OF WASHINGS ;  Surgeon: Ena Dawley, MD;  Location: [facility];  Service: Gynecology;  Laterality: Right;  . Laparoscopic lysis of adhesions N/A 11/01/2013    Procedure: LAPAROSCOPIC LYSIS OF ADHESIONS;  Surgeon: Ena Dawley, MD;  Location: [facility];  Service: Gynecology;  Laterality: N/A;  . Laparoscopy N/A 11/01/2013    Procedure: LAPAROSCOPY DIAGNOSTIC;  Surgeon: Ena Dawley, MD;  Location: [facility];  Service: Gynecology;  Laterality: N/A;   Social History:  reports that she quit smoking about 11 years ago. She has never used smokeless tobacco. She reports that she does not drink alcohol or use illicit drugs. Where does patient live rehabilitation. Can patient participate in ADLs? No.  Allergies  Allergen Reactions  . Lipitor [Atorvastatin Calcium] Other (See Comments)    Myocitis  . Crestor [Rosuvastatin]     myalgias    Family History:  Family History  Problem Relation Age of Onset  . Diabetes Mother   . Hyperlipidemia Mother   . Hypertension Mother   . Hypertension Father   . Diabetes Father   . Cancer Father   .  Heart attack Brother   . Hyperlipidemia Brother   . Hypertension Brother       Prior to Admission medications   Medication Sig Start Date End Date Taking? Authorizing Provider  allopurinol (ZYLOPRIM) 300 MG tablet Take 400 mg by mouth daily.    Yes Historical Provider, MD  buPROPion (WELLBUTRIN) 100 MG tablet Take 100 mg by mouth 3 (three) times daily.   Yes Historical Provider, MD  Canagliflozin (INVOKANA) 300 MG TABS Take 1 tablet by mouth daily.   Yes Historical Provider, MD  colesevelam (WELCHOL) 625 MG tablet Take 1,875 mg by  mouth 2 (two) times daily with a meal.   Yes Historical Provider, MD  cycloSPORINE (RESTASIS) 0.05 % ophthalmic emulsion Place 1 drop into both eyes 2 (two) times daily.   Yes Historical Provider, MD  ezetimibe (ZETIA) 10 MG tablet Take 10 mg by mouth daily.   Yes Historical Provider, MD  ferrous sulfate 325 (65 FE) MG tablet Take 325 mg by mouth daily with breakfast.   Yes Historical Provider, MD  fluticasone (VERAMYST) 27.5 MCG/SPRAY nasal spray Place 2 sprays into the nose daily.   Yes Historical Provider, MD  furosemide (LASIX) 80 MG tablet Take 80 mg by mouth daily. 11/13/13  Yes Mihai Croitoru, MD  gabapentin (NEURONTIN) 600 MG tablet Take 600 mg by mouth at bedtime.   Yes Historical Provider, MD  glyBURIDE (DIABETA) 5 MG tablet Take 5 mg by mouth daily with supper.   Yes Historical Provider, MD  LEVEMIR FLEXTOUCH 100 UNIT/ML Pen Inject 30 Units into the skin every evening. 10 units qam and 30 units qpm. 11/15/13  Yes Historical Provider, MD  levothyroxine (SYNTHROID, LEVOTHROID) 75 MCG tablet Take 75 mcg by mouth daily.   Yes Historical Provider, MD  lidocaine (LIDODERM) 5 % Place 1 patch onto the skin every 12 (twelve) hours. Remove & Discard patch within 12 hours or as directed by MD   Yes Historical Provider, MD  meloxicam (MOBIC) 15 MG tablet Take 15 mg by mouth daily.   Yes Historical Provider, MD  metFORMIN (GLUCOPHAGE) 500 MG tablet Take 500 mg by mouth 2 (two) times daily with a meal.   Yes Historical Provider, MD  methylphenidate (CONCERTA) 36 MG CR tablet Take 36 mg by mouth every morning.   Yes Historical Provider, MD  nebivolol (BYSTOLIC) 5 MG tablet Take 5 mg by mouth daily.   Yes Historical Provider, MD  pantoprazole (PROTONIX) 40 MG tablet Take 40 mg by mouth daily.   Yes Historical Provider, MD  potassium chloride SA (K-DUR,KLOR-CON) 20 MEQ tablet Take 40 mEq by mouth daily.    Yes Historical Provider, MD  spironolactone (ALDACTONE) 50 MG tablet Take 50 mg by mouth daily.   Yes  Historical Provider, MD  temazepam (RESTORIL) 30 MG capsule Take 30 mg by mouth at bedtime as needed for sleep.   Yes Historical Provider, MD  Vitamin D, Ergocalciferol, (DRISDOL) 50000 UNITS CAPS Take 50,000 Units by mouth 2 (two) times a week. Mondays and fridays   Yes Historical Provider, MD    Physical Exam: Filed Vitals:   05/08/14 2000 05/08/14 2030 05/08/14 2130 05/08/14 2200  BP: 129/52 104/58 107/63 105/54  Pulse: 72 75 72 75  Resp: 21 17 14 20   SpO2: 94% 97% 97% 96%     General:  Well-developed and nourished.  Eyes: Anicteric no pallor. 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  ENT: No discharge from the ears eyes nose mouth.  Neck: No mass felt.  Cardiovascular:  S1-S2 heard.  Respiratory: No rhonchi or crepitations.  Abdomen: Mild tenderness in the left lower quadrant no guarding or rigidity.  Skin: No rash.  Musculoskeletal: No edema.  Psychiatric: Patient appears normal.  Neurologic: Alert oriented to name and place. Patient has mild weakness of the right upper extremity and lower extremity around 4 x 5. Left upper and lower extremities is 5 x 5. No facial asymmetry. Tongue is midline.  Labs on Admission:  Basic Metabolic Panel:  Recent Labs Lab 05/08/14 1434 05/08/14 1439  NA 139 138  K 4.3 4.0  CL 102 105  CO2 23  --   GLUCOSE 140* 139*  BUN 14 14  CREATININE 0.75 0.90  CALCIUM 9.7  --    Liver Function Tests:  Recent Labs Lab 05/08/14 1434  AST 24  ALT 32  ALKPHOS 109  BILITOT 0.4  PROT 7.4  ALBUMIN 3.7   No results found for this basename: LIPASE, AMYLASE,  in the last 168 hours No results found for this basename: AMMONIA,  in the last 168 hours CBC:  Recent Labs Lab 05/08/14 1434 05/08/14 1439  WBC 6.6  --   NEUTROABS 3.6  --   HGB 14.7 16.0*  HCT 43.5 47.0*  MCV 93.1  --   PLT 270  --    Cardiac Enzymes: No results found for this basename: CKTOTAL, CKMB, CKMBINDEX, TROPONINI,  in the last 168 hours  BNP (last 3 results) No results  found for this basename: PROBNP,  in the last 8760 hours CBG:  Recent Labs Lab 05/08/14 1448  GLUCAP 133*    Radiological Exams on Admission: Ct Head Wo Contrast  05/08/2014   ADDENDUM REPORT: 05/08/2014 14:57  ADDENDUM: Critical Value/emergent results were called by telephone at the time of interpretation on 05/08/2014 at 2:56 pm to Dr. Leonel Ramsay, neurology,who verbally acknowledged these results. It should be noted that the patient by report had a CVA approximately 1 month ago. It is possible that the apparent hemorrhage in the left temporal region actually represents calcification as a reactive response to prior infarct. It is possible that there is both calcification and hemorrhage in this area. Given this circumstance, MR could be most helpful for further assessment with respect to these differential considerations.   Electronically Signed   By: Lowella Grip M.D.   On: 05/08/2014 14:57   05/08/2014   CLINICAL DATA:  Patient with acute onset aphasia ; reported CVA with right-sided weakness 1 month prior  EXAM: CT HEAD WITHOUT CONTRAST  TECHNIQUE: Contiguous axial images were obtained from the base of the skull through the vertex without intravenous contrast.  COMPARISON:  None.  FINDINGS: There is mild diffuse atrophy. There is hemorrhage tracking along the periphery of the parenchyma of the superior left temporal lobe. This hemorrhage is not causing appreciable mass effect. There is no midline shift. There is no mass appreciable.  There is a small infarct in the anterior limb of the left internal capsule which may be acute. There is patchy small vessel disease in the centra semiovale bilaterally. Elsewhere gray-white compartments appear normal.  There is a prosthetic globe on the right. Bony calvarium appears intact. The mastoid air cells are clear.  IMPRESSION: There is hemorrhage in the parenchyma of the lateral left temporal lobe. This appearance raises question of amyloid angiography.  There is a questionable acute small infarct in the anterior limb of the left internal capsule. There is atrophy with patchy periventricular small vessel disease. There is no subdural  or epidural fluid. 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  Electronically Signed: By: Lowella Grip M.D. On: 05/08/2014 14:53   Mr Virgel Paling Wo Contrast  05/08/2014   CLINICAL DATA:  63 year old female history of stroke 1 month ago who presents with decreased level of consciousness. Suspected LEFT hemisphere acute on chronic infarction. Stroke risk factors include hypertension, diabetes, and hypercholesterolemia.  EXAM: MRI HEAD WITHOUT CONTRAST  MRA HEAD WITHOUT CONTRAST  TECHNIQUE: Multiplanar, multiecho pulse sequences of the brain and surrounding structures were obtained without intravenous contrast. Angiographic images of the head were obtained using MRA technique without contrast.  COMPARISON:  CT head earlier today.  FINDINGS: MRI HEAD FINDINGS  Moderately extensive restricted diffusion affects much of the centrum semiovale on the LEFT, extending from the subcortical frontal white matter to the parietal region, involving basal ganglia and insular region as well.  No restricted diffusion or blood products in the region of the LEFT temporal cortex abnormality displayed on CT. There is brain substance loss in this region, T2 and FLAIR hyperintensity, without lobar hemorrhage, and the CT findings likely reflect mineralization related to a subacute insult. There is T1 shortening affecting the LEFT basal ganglia, particularly the caudate, and loss of brain substance, with asymmetric enlargement of the frontal horn LEFT lateral ventricle, consistent with a subacute to chronic insult.  Generalized atrophy. Chronic microvascular ischemic change. Flow voids are maintained. No midline abnormality. Prosthetic RIGHT globe. Trace mastoid effusions. No acute sinus disease.  MRA HEAD FINDINGS  The internal carotid arteries are widely patent.  The LEFT is slightly smaller. The basilar artery is widely patent with both vertebrals contributing, LEFT greater than RIGHT.  There is a severe stenosis of the M1 segment LEFT middle cerebral artery estimated 90% or greater. No similar lesion on the RIGHT. No flow reducing lesion of the anterior or posterior cerebral arteries. No cerebellar branch occlusion. No intracranial aneurysm.  IMPRESSION: Moderately extensive acute LEFT subcortical white matter infarction.  Abnormal LEFT temporal lobe on CT consistent with mineralization of a subacute insult. No parenchymal hemorrhage is evident.  Severe 90% LEFT MCA M1 segment stenosis correlates with the observed pattern(s) of ischemia.   Electronically Signed   By: Rolla Flatten M.D.   On: 05/08/2014 20:38   Mr Brain Wo Contrast  05/08/2014   CLINICAL DATA:  63 year old female history of stroke 1 month ago who presents with decreased level of consciousness. Suspected LEFT hemisphere acute on chronic infarction. Stroke risk factors include hypertension, diabetes, and hypercholesterolemia.  EXAM: MRI HEAD WITHOUT CONTRAST  MRA HEAD WITHOUT CONTRAST  TECHNIQUE: Multiplanar, multiecho pulse sequences of the brain and surrounding structures were obtained without intravenous contrast. Angiographic images of the head were obtained using MRA technique without contrast.  COMPARISON:  CT head earlier today.  FINDINGS: MRI HEAD FINDINGS  Moderately extensive restricted diffusion affects much of the centrum semiovale on the LEFT, extending from the subcortical frontal white matter to the parietal region, involving basal ganglia and insular region as well.  No restricted diffusion or blood products in the region of the LEFT temporal cortex abnormality displayed on CT. There is brain substance loss in this region, T2 and FLAIR hyperintensity, without lobar hemorrhage, and the CT findings likely reflect mineralization related to a subacute insult. There is T1 shortening affecting  the LEFT basal ganglia, particularly the caudate, and loss of brain substance, with asymmetric enlargement of the frontal horn LEFT lateral ventricle, consistent with a subacute to chronic insult.  Generalized atrophy. Chronic  microvascular ischemic change. Flow voids are maintained. No midline abnormality. Prosthetic RIGHT globe. Trace mastoid effusions. No acute sinus disease.  MRA HEAD FINDINGS  The internal carotid arteries are widely patent. The LEFT is slightly smaller. The basilar artery is widely patent with both vertebrals contributing, LEFT greater than RIGHT.  There is a severe stenosis of the M1 segment LEFT middle cerebral artery estimated 90% or greater. No similar lesion on the RIGHT. No flow reducing lesion of the anterior or posterior cerebral arteries. No cerebellar branch occlusion. No intracranial aneurysm.  IMPRESSION: Moderately extensive acute LEFT subcortical white matter infarction.  Abnormal LEFT temporal lobe on CT consistent with mineralization of a subacute insult. No parenchymal hemorrhage is evident.  Severe 90% LEFT MCA M1 segment stenosis correlates with the observed pattern(s) of ischemia.   Electronically Signed   By: Rolla Flatten M.D.   On: 05/08/2014 20:38    EKG: Independently reviewed. Normal sinus rhythm.  Assessment/Plan Principal Problem:   CVA (cerebral infarction) Active Problems:   History of CHF (congestive heart failure)   Diabetes mellitus type 2, controlled   Hypothyroidism   #1. Acute CVA - since patient's MRI shows no CVA with MRI findings showing severe left MCA segment stenosis I have discussed with on-call neurologist Dr. Janann Colonel over this time advised close observation in step down and if there is any further deterioration will need to go to ICU. Repeat CT head has been ordered for a.m. Patient has been placed on swallow evaluation neurochecks aspirin. #2. Left lower quadrant pain - CT abdomen and pelvis has been ordered. #3. Diabetes mellitus2 -  patient has been placed on sliding-scale coverage along with patient's long-acting insulin. Closely follow CBGs. Hold metformin for now. #4. Hypothyroidism - patient will be placed on IV Synthroid for now until patient can reliably take orally. #5. History of OSA noncompliant with CPAP. #6. History of CHF last EF not known - since patient has acute CVA holding off diuretics for now.  #7. History of gout.    Code Status: Full code.  Family Communication: Patient's daughter at the bedside.  Disposition Plan: Admit to inpatient.    Abigail Johns N. [facility] Pager [phone].  If 7PM-7AM, please contact night-coverage [email or web address] Password [email or web address] 05/08/2014, 10:46 PM

## 2014-05-08 NOTE — ED Notes (Signed)
Daughter at bedside. States EMS has the only copy of med list-- will call EMS-- lives with daughter-- daughter states that after pt's discharge from [facility] transferred to [place]-- pt was able to walk, feed self, has been home x 2 weeks

## 2014-05-09 ENCOUNTER — Inpatient Hospital Stay (HOSPITAL_COMMUNITY): Payer: BC Managed Care – PPO

## 2014-05-09 ENCOUNTER — Encounter (HOSPITAL_COMMUNITY): Payer: Self-pay | Admitting: Radiology

## 2014-05-09 DIAGNOSIS — I639 Cerebral infarction, unspecified: Secondary | ICD-10-CM

## 2014-05-09 DIAGNOSIS — R569 Unspecified convulsions: Secondary | ICD-10-CM

## 2014-05-09 LAB — LIPID PANEL
Cholesterol: 200 mg/dL (ref 0–200)
HDL: 40 mg/dL (ref >39)
LDL CALC: 120 mg/dL — AB (ref 0–99)
TRIGLYCERIDES: 198 mg/dL — AB (ref <150)
Total CHOL/HDL Ratio: 5 RATIO
VLDL: 40 mg/dL (ref 0–40)

## 2014-05-09 LAB — COMPREHENSIVE METABOLIC PANEL
ALK PHOS: 103 U/L (ref 39–117)
ALT: 33 U/L (ref 0–35)
AST: 22 U/L (ref 0–37)
Albumin: 3.4 g/dL — ABNORMAL LOW (ref 3.5–5.2)
Anion gap: 15 (ref 5–15)
BUN: 16 mg/dL (ref 6–23)
CO2: 23 meq/L (ref 19–32)
Calcium: 9.3 mg/dL (ref 8.4–10.5)
Chloride: 103 mEq/L (ref 96–112)
Creatinine, Ser: 0.81 mg/dL (ref 0.50–1.10)
GFR calc Af Amer: 89 mL/min — ABNORMAL LOW (ref >90)
GFR calc non Af Amer: 77 mL/min — ABNORMAL LOW (ref >90)
Glucose, Bld: 148 mg/dL — ABNORMAL HIGH (ref 70–99)
Potassium: 4.2 mEq/L (ref 3.7–5.3)
SODIUM: 141 meq/L (ref 137–147)
TOTAL PROTEIN: 6.9 g/dL (ref 6.0–8.3)
Total Bilirubin: 0.4 mg/dL (ref 0.3–1.2)

## 2014-05-09 LAB — CBC WITH DIFFERENTIAL/PLATELET
BASOS PCT: 0 % (ref 0–1)
Basophils Absolute: 0 10*3/uL (ref 0.0–0.1)
EOS PCT: 3 % (ref 0–5)
Eosinophils Absolute: 0.2 10*3/uL (ref 0.0–0.7)
HCT: 42.3 % (ref 36.0–46.0)
HEMOGLOBIN: 14.5 g/dL (ref 12.0–15.0)
LYMPHS ABS: 2.1 10*3/uL (ref 0.7–4.0)
Lymphocytes Relative: 28 % (ref 12–46)
MCH: 31.6 pg (ref 26.0–34.0)
MCHC: 34.3 g/dL (ref 30.0–36.0)
MCV: 92.2 fL (ref 78.0–100.0)
MONO ABS: 0.5 10*3/uL (ref 0.1–1.0)
MONOS PCT: 7 % (ref 3–12)
Neutro Abs: 4.9 10*3/uL (ref 1.7–7.7)
Neutrophils Relative %: 62 % (ref 43–77)
Platelets: 253 10*3/uL (ref 150–400)
RBC: 4.59 MIL/uL (ref 3.87–5.11)
RDW: 13.9 % (ref 11.5–15.5)
WBC: 7.8 10*3/uL (ref 4.0–10.5)

## 2014-05-09 LAB — GLUCOSE, CAPILLARY
Glucose-Capillary: 122 mg/dL — ABNORMAL HIGH (ref 70–99)
Glucose-Capillary: 124 mg/dL — ABNORMAL HIGH (ref 70–99)
Glucose-Capillary: 138 mg/dL — ABNORMAL HIGH (ref 70–99)
Glucose-Capillary: 151 mg/dL — ABNORMAL HIGH (ref 70–99)
Glucose-Capillary: 154 mg/dL — ABNORMAL HIGH (ref 70–99)
Glucose-Capillary: 170 mg/dL — ABNORMAL HIGH (ref 70–99)
Glucose-Capillary: 175 mg/dL — ABNORMAL HIGH (ref 70–99)

## 2014-05-09 LAB — HEMOGLOBIN A1C
Hgb A1c MFr Bld: 7.3 % — ABNORMAL HIGH (ref <5.7)
Mean Plasma Glucose: 163 mg/dL — ABNORMAL HIGH (ref <117)

## 2014-05-09 LAB — MRSA PCR SCREENING: MRSA BY PCR: POSITIVE — AB

## 2014-05-09 LAB — TSH: TSH: 3.16 u[IU]/mL (ref 0.350–4.500)

## 2014-05-09 MED ORDER — ACETAMINOPHEN 325 MG PO TABS: 650.0000 mg | ORAL_TABLET | Freq: Four times a day (QID) | ORAL | Status: DC | PRN

## 2014-05-09 MED ORDER — CANAGLIFLOZIN 300 MG PO TABS
1.0000 | ORAL_TABLET | Freq: Every day | ORAL | Status: DC
  Administered 2014-05-09 – 2014-05-10 (×2): 300 mg via ORAL
  Filled 2014-05-09 (×4): qty 1

## 2014-05-09 MED ORDER — ALLOPURINOL 100 MG PO TABS
400.0000 mg | ORAL_TABLET | Freq: Every day | ORAL | Status: DC
  Administered 2014-05-09 – 2014-05-10 (×2): 400 mg via ORAL
  Filled 2014-05-09 (×2): qty 4

## 2014-05-09 MED ORDER — GABAPENTIN 600 MG PO TABS
600.0000 mg | ORAL_TABLET | Freq: Every day | ORAL | Status: DC
  Administered 2014-05-09 – 2014-05-11 (×2): 600 mg via ORAL
  Filled 2014-05-09 (×2): qty 1

## 2014-05-09 MED ORDER — INSULIN ASPART 100 UNIT/ML ~~LOC~~ SOLN
0.0000 [IU] | SUBCUTANEOUS | Status: DC
  Administered 2014-05-09: 2 [IU] via SUBCUTANEOUS

## 2014-05-09 MED ORDER — VITAMIN D (ERGOCALCIFEROL) 1.25 MG (50000 UNIT) PO CAPS
50000.0000 [IU] | ORAL_CAPSULE | ORAL | Status: DC
  Administered 2014-05-10: 50000 [IU] via ORAL
  Filled 2014-05-09 (×2): qty 1

## 2014-05-09 MED ORDER — INSULIN ASPART 100 UNIT/ML ~~LOC~~ SOLN
0.0000 [IU] | Freq: Three times a day (TID) | SUBCUTANEOUS | Status: DC
  Administered 2014-05-10 (×2): 1 [IU] via SUBCUTANEOUS

## 2014-05-09 MED ORDER — PANTOPRAZOLE SODIUM 40 MG PO TBEC
40.0000 mg | DELAYED_RELEASE_TABLET | Freq: Every day | ORAL | Status: DC
  Administered 2014-05-09 – 2014-05-10 (×2): 40 mg via ORAL
  Filled 2014-05-09 (×2): qty 1

## 2014-05-09 MED ORDER — METHYLPHENIDATE HCL ER (OSM) 18 MG PO TBCR
36.0000 mg | EXTENDED_RELEASE_TABLET | Freq: Every day | ORAL | Status: DC
  Administered 2014-05-10: 36 mg via ORAL
  Filled 2014-05-09 (×2): qty 2

## 2014-05-09 MED ORDER — NEBIVOLOL HCL 5 MG PO TABS
5.0000 mg | ORAL_TABLET | Freq: Every day | ORAL | Status: DC
  Administered 2014-05-09 – 2014-05-10 (×2): 5 mg via ORAL
  Filled 2014-05-09 (×3): qty 1

## 2014-05-09 MED ORDER — EZETIMIBE 10 MG PO TABS
10.0000 mg | ORAL_TABLET | Freq: Every day | ORAL | Status: DC
  Administered 2014-05-09 – 2014-05-10 (×2): 10 mg via ORAL
  Filled 2014-05-09 (×2): qty 1

## 2014-05-09 MED ORDER — METHYLPHENIDATE HCL ER (OSM) 36 MG PO TBCR
36.0000 mg | EXTENDED_RELEASE_TABLET | Freq: Every day | ORAL | Status: DC
  Administered 2014-05-09: 36 mg via ORAL

## 2014-05-09 MED ORDER — INFLUENZA VAC SPLIT QUAD 0.5 ML IM SUSY
0.5000 mL | PREFILLED_SYRINGE | INTRAMUSCULAR | Status: AC
  Administered 2014-05-10: 0.5 mL via INTRAMUSCULAR
  Filled 2014-05-09: qty 0.5

## 2014-05-09 MED ORDER — ASPIRIN 325 MG PO TABS: 325.0000 mg | ORAL_TABLET | Freq: Every day | ORAL | Status: DC

## 2014-05-09 MED ORDER — BUPROPION HCL 100 MG PO TABS
100.0000 mg | ORAL_TABLET | Freq: Three times a day (TID) | ORAL | Status: DC
  Administered 2014-05-09 – 2014-05-11 (×3): 100 mg via ORAL
  Filled 2014-05-09 (×7): qty 1

## 2014-05-09 MED ORDER — DEXTROSE 5 % IV SOLN
1.0000 g | INTRAVENOUS | Status: DC
  Administered 2014-05-09 – 2014-05-11 (×3): 1 g via INTRAVENOUS
  Filled 2014-05-09 (×4): qty 10

## 2014-05-09 MED ORDER — LEVOTHYROXINE SODIUM 50 MCG PO TABS
75.0000 ug | ORAL_TABLET | Freq: Every day | ORAL | Status: DC
  Administered 2014-05-10: 75 ug via ORAL
  Filled 2014-05-09 (×2): qty 1

## 2014-05-09 MED ORDER — COLESEVELAM HCL 625 MG PO TABS
1875.0000 mg | ORAL_TABLET | Freq: Two times a day (BID) | ORAL | Status: DC
  Administered 2014-05-10: 1875 mg via ORAL
  Filled 2014-05-09 (×5): qty 3

## 2014-05-09 MED ORDER — TEMAZEPAM 7.5 MG PO CAPS: 30.0000 mg | ORAL_CAPSULE | Freq: Every evening | ORAL | Status: DC | PRN

## 2014-05-09 MED ORDER — ASPIRIN 325 MG PO TABS
325.0000 mg | ORAL_TABLET | Freq: Every day | ORAL | Status: DC
  Administered 2014-05-10: 325 mg via ORAL
  Filled 2014-05-09: qty 1

## 2014-05-09 MED ORDER — TRAMADOL HCL 50 MG PO TABS
50.0000 mg | ORAL_TABLET | Freq: Four times a day (QID) | ORAL | Status: DC | PRN
  Administered 2014-05-09: 50 mg via ORAL
  Filled 2014-05-09: qty 1

## 2014-05-09 MED ORDER — PNEUMOCOCCAL VAC POLYVALENT 25 MCG/0.5ML IJ INJ
0.5000 mL | INJECTION | INTRAMUSCULAR | Status: AC
  Administered 2014-05-10: 0.5 mL via INTRAMUSCULAR

## 2014-05-09 NOTE — Progress Notes (Signed)
*  PRELIMINARY RESULTS* Vascular Ultrasound Carotid Duplex (Doppler) has been completed.  Preliminary findings: Bilateral:  1-39% ICA stenosis.  Vertebral artery flow is antegrade.      Landry Mellow, RDMS, RVT  05/09/2014, 11:17 AM

## 2014-05-09 NOTE — Progress Notes (Signed)
Patient transferred from [facility]S. Report received from [facility]. Daughter at bedside. Safety precautions and orders reviewed with patient/family. TELE applied and confirmed with CCMD. MD paged regarding daily medication. No other distress noted. Will continue to monitor.  Ave Filter, RN

## 2014-05-09 NOTE — Plan of Care (Signed)
Problem: Acute Treatment Outcomes Goal: Neuro exam at baseline or improved Outcome: Completed/Met Date Met:  05/09/14 Patient is able to move all extremities. Weaker on right side. Right leg is much weaker than left, she is unable to lift it off the bed, though she is able to perform plantar flexion and extension with strength 4/5. She is aphasic. She nods her head appropriately and follows commands, she appears to be oriented x4 and understands what is happening. No loss of sensation or numbness and tingling. Right eye is edematous and has a droop. NIH scale done on arrival to unit. Repeat CT scan done this morning.

## 2014-05-09 NOTE — Progress Notes (Signed)
STROKE TEAM PROGRESS NOTE   HISTORY Marsheila Vachon is a 63 y.o. female PMH of HTN, CHF, HLD, thyroid disease, right glass eye, OSA, and recent stroke in 9/15 at [facility] in [place], [place] that left her with residual right side hemiparesis. Pt had apparently been fully functional and independent before stroke last month. Pt was then transferred to a rehab center in [facility] and had been able to perform some simple ADLs but still had continued right sided hemiparesis before coming home for a couple of weeks but had normal speech and mentation. The daughter reports that her mother had been complaining of some vague abdominal pain for the previous 2 days that wasn't resolved with alka seltzer. The daughter left for work at 106AM and stated her mother was in her normal state of health but still complaining of some abdominal pain. Apparently when the pt sister and nurse came to care for the patient she was not responsive. The didn't note any urinary or stool incontinence or random movements. The patient had apparently taken all her medications that AM and she wasn't on any anticoagulation or ASA. EMS was then called by the pt's daughter where she continued to be non responsive, with right sided hemiparesis, and some rapid eye movement. Good clinical exam was difficult as pt altered to the point of inability to participate in exam for EMS. No frank seizure like activity seen. Code stroke was called. She was LKW at  [facility] 05/08/2014. Patient was not administered TPA secondary to out of window and stroke within past 90 days. She was admitted to step down for further evaluation and treatment.   SUBJECTIVE (INTERVAL HISTORY) Her daughter is at the bedside.  Overall she feels her condition is back to her baseline before this admitssion. Daughter recounted patients previous hx to Dr. Erlinda Hong. It seemed that pt was at her baseline yesterday morning before going to sleep but not responsive in the  afternoon. EMS was called and she was sent to ER, gradually she was open her eyes and back to her previous baseline at 7pm. EEG was done at 6:30pm and did not show seizure. MRI showed new stroke at left corona radiata area.   OBJECTIVE Temp:  [97.8 F (36.6 C)-98.3 F (36.8 C)] 97.8 F (36.6 C) (10/14 1300) Pulse Rate:  [67-81] 73 (10/14 0624) Cardiac Rhythm:  [-] Normal sinus rhythm (10/14 0700) Resp:  [14-24] 18 (10/14 0624) BP: (94-129)/(42-87) 102/42 mmHg (10/14 1300) SpO2:  [94 %-98 %] 94 % (10/14 0624) Weight:  [228 lb 9.9 oz (103.7 kg)] 228 lb 9.9 oz (103.7 kg) (10/13 2336)   Recent Labs Lab 05/08/14 2350 05/09/14 0429 05/09/14 0612 05/09/14 0732 05/09/14 1312  GLUCAP 124* 138* 151* 154* 170*    Recent Labs Lab 05/08/14 1434 05/08/14 1439 05/09/14 0640  NA 139 138 141  K 4.3 4.0 4.2  CL 102 105 103  CO2 23  --  23  GLUCOSE 140* 139* 148*  BUN 14 14 16   CREATININE 0.75 0.90 0.81  CALCIUM 9.7  --  9.3    Recent Labs Lab 05/08/14 1434 05/09/14 0640  AST 24 22  ALT 32 33  ALKPHOS 109 103  BILITOT 0.4 0.4  PROT 7.4 6.9  ALBUMIN 3.7 3.4*    Recent Labs Lab 05/08/14 1434 05/08/14 1439 05/09/14 0640  WBC 6.6  --  7.8  NEUTROABS 3.6  --  4.9  HGB 14.7 16.0* 14.5  HCT 43.5 47.0* 42.3  MCV  93.1  --  92.2  PLT 270  --  253   No results found for this basename: CKTOTAL, CKMB, CKMBINDEX, TROPONINI,  in the last 168 hours  Recent Labs  05/08/14 1434  LABPROT 14.9  INR 1.17    Recent Labs  05/08/14 1722  COLORURINE YELLOW  LABSPEC 1.012  PHURINE 5.0  GLUCOSEU NEGATIVE  HGBUR NEGATIVE  BILIRUBINUR NEGATIVE  KETONESUR NEGATIVE  PROTEINUR NEGATIVE  UROBILINOGEN 0.2  NITRITE NEGATIVE  LEUKOCYTESUR MODERATE*       Component Value Date/Time   CHOL 200 05/09/2014 0640   TRIG 198* 05/09/2014 0640   HDL 40 05/09/2014 0640   CHOLHDL 5.0 05/09/2014 0640   VLDL 40 05/09/2014 0640   LDLCALC 120* 05/09/2014 0640   Lab Results  Component Value  Date   HGBA1C 7.3* 05/09/2014      Component Value Date/Time   LABOPIA NONE DETECTED 05/08/2014 1722   COCAINSCRNUR NONE DETECTED 05/08/2014 1722   LABBENZ NONE DETECTED 05/08/2014 1722   AMPHETMU NONE DETECTED 05/08/2014 1722   THCU NONE DETECTED 05/08/2014 1722   LABBARB NONE DETECTED 05/08/2014 1722     Recent Labs Lab 05/08/14 1434  ETH <11    Ct Abdomen Pelvis Wo Contrast 05/09/2014    1. No acute intra-abdominal findings. 2. Nonobstructive bilateral nephrolithiasis. 3. Hepatic steatosis.   Dg Chest 2 View 05/09/2014    Low lung volumes with bibasilar atelectasis.     Ct Head Wo Contrast 05/09/2014   1. Grossly unchanged ill-defined left-sided periventricular hypodensities compatible with the known acute infarct demonstrated on brain MRI performed day prior. No CT evidence of hemorrhagic conversion or new acute large territory infarct. 2. Increased attenuation of multiple sulci within the left temporal lobe compatible with mineralization as demonstrated on prior brain MRI.    05/08/2014   There is hemorrhage in the parenchyma of the lateral left temporal lobe. This appearance raises question of amyloid angiography. There is a questionable acute small infarct in the anterior limb of the left internal capsule. There is atrophy with patchy periventricular small vessel disease. There is no subdural or epidural fluid. 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    It should be noted that the patient by report had a CVA approximately 1 month ago. It is possible that the apparent hemorrhage in the left temporal region actually represents calcification as a reactive response to prior infarct. It is possible that there is both calcification and hemorrhage in this area. Given this circumstance, MR could be most helpful for further assessment with respect to these differential considerations.  Mr Brain Wo Contrast 05/08/2014   Moderately extensive acute LEFT subcortical white matter infarction.   Abnormal LEFT temporal lobe on CT consistent with mineralization of a subacute insult. No parenchymal hemorrhage is evident.    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     2D Echocardiogram  pending  Carotid Doppler  No evidence of hemodynamically significant internal carotid artery stenosis. Vertebral artery flow is antegrade.   EEG - Clinical Interpretation: This EEG demonstrates evidence of a left frontotemporal cerebral dysfunction that is etiologically nonspecific. There was no seizure or seizure predisposition recorded on this study.    PHYSICAL EXAM  Temp:  [97.8 F (36.6 C)-98.3 F (36.8 C)] 97.8 F (36.6 C) (10/14 1300) Pulse Rate:  [67-81] 73 (10/14 0624) Resp:  [14-24] 18 (10/14 0624) BP: (94-129)/(42-87) 102/42 mmHg (10/14 1300)  SpO2:  [94 %-98 %] 94 % (10/14 0624) Weight:  [228 lb 9.9 oz (103.7 kg)] 228 lb 9.9 oz (103.7 kg) (10/13 2336)  General - Well nourished, well developed, in no apparent distress.  Ophthalmologic - right prosthetic glass eye, left eye not able to see through.  Cardiovascular - Regular rate and rhythm with no murmur.  Mental Status -  Awake alert not able to answer orientation questions due to aphasia. Language impaired, able to have some words out but not full sentences, follow simple commands but not complex commands, naming and repetition deficit.    Cranial Nerves II - XII - II - not blinking to threat on the left. Right eye prosthetic.  III, IV, VI - Extraocular movements intact. V - Facial sensation intact bilaterally. VII - left facial droop. VIII - Hearing & vestibular intact bilaterally. X - Palate elevates symmetrically. XI - Chin turning & shoulder shrug intact bilaterally. XII - Tongue protrusion intact.  Motor Strength - The patient's strength was 5-/5 right UE and LE, 5/5 left UE and LE and pronator drift was absent.  Bulk was normal  and fasciculations were absent.   Motor Tone - Muscle tone was assessed at the neck and appendages and was normal.  Reflexes - The patient's reflexes were 1+ in all extremities and she had no pathological reflexes.  Sensory - Light touch, temperature/pinprick were assessed and were normal.    Coordination - The patient had normal movements in the hands with no ataxia or dysmetria.  Tremor was absent.  Gait and Station - not tested.  ASSESSMENT/PLAN Ms. Jewel Fabre is a 63 y.o. female with history of HTN, CHF, HLD, thyroid disease, right glass eye, OSA, and recent L temporal stroke in 9/15 that left her with residual right side hemiparesis found unresponsive at home. She did not receive IV t-PA due to delay in arrival and stroke within past 90 days.   Acute left corona radiata stroke and subacute L temporal lobe infarct Sept 2015 with lamina necrosis - unclear etiology. The acute stroke maybe due to hypotension in the setting of high grade M1 stenosis. ? Additional workup needed. Have requested copy of records from [facility] in [place]. However, her presentation is also suspicious for seizure which is common after cortical stroke.    MRI  Acute left corona radiata stroke with subacute L temporal lobe infarct. Temporal area of stroke often associated with post stroke seizure.  MRA  L MCA M1 90% stenosis - etiology not clear  Carotid Doppler  No significant stenosis   2D Echo  pending   Request medical record in [facility] but at the meantime recommend further TEE and possible loop recorder   Lovenox 40 mg sq daily for VTE prophylaxis   Dysphagia diet  Bedrest, OOB with assistance  no antithrombotics prior to admission, now on aspirin 300 suppository  Risk factor education  Ongoing aggressive risk factor management  Resultant   Therapy recommendations:  pending   Disposition:  pending   ? Seizure  - EEG showed left frontotemporal slowing and no seizure - but  it was done when pt almost back to her previous baseline. - recommend keppra 500mg  bid - According to [place] law, pt can not drive until seizure free for 6 months and under physician's care.  - Maintain seizure precautions.   Hypertension  Home meds: bystolic  Stable  Permissive hypertension for 24-48 hours - OK if < 220/120. Gradually normalize in 5-7 days.  Hyperlipidemia  Home meds:  zetia and welchol, intolerant to lipitor and crestor  LDL 120  No statin d/t intolerance  Continue meds at discharge  Diabetes  HgbA1c 7.3 goal < 7.0  On metformin and glyburide at home  Uncontrolled but improving  DM education  Other Stroke Risk Factors Former Cigarette smoker, quit in 2003   Obesity, Body mass index is 39.22 kg/(m^2).    Coronary artery disease - MI Obstructive sleep apnea, noncompliant with CPAP at home  Other Active Problems  UTI  abd pain, ? R/t UTI  Thyroid disease  Chronic back pain  Other Pertinent History  Hx CHF  Gout  MRSA +  Hospital day # 1  [facility] BIBY, MSN, RN, ANVP-BC, ANP-BC, [facility] Pager: [phone] 05/09/2014 6:03 PM   I, the attending vascular neurologist, have personally obtained a history, examined the patient, evaluated laboratory data, individually viewed imaging studies, and formulated the assessment and plan of care.  I have made any additions or clarifications directly to the above note and agree with the findings and plan as currently documented.   Rosalin Hawking, MD PhD Stroke Neurology 05/09/2014 6:25 PM  To contact Stroke Continuity provider, please refer to [email or web address] After hours, contact General Neurology

## 2014-05-09 NOTE — Evaluation (Signed)
Speech Language Pathology Evaluation Patient Details Name: Abigail Johns MRN: [id number] DOB: 07-May-1951 Today's Date: 05/09/2014 Time: 1245-1300 SLP Time Calculation (min): 15 min  Problem List:  Patient Active Problem List   Diagnosis Date Noted  . CVA (cerebral infarction) 05/08/2014  . Diabetes mellitus type 2, controlled 05/08/2014  . Hypothyroidism 05/08/2014  . Hypercholesteremia 11/29/2013  . Status post laparoscopy 11/01/2013  . OSA on CPAP 10/31/2013  . HTN (hypertension) 10/31/2013  . Morbid obesity 10/31/2013  . Leg edema 10/31/2013  . Ovarian cyst 10/31/2013  . Preoperative cardiovascular examination 10/31/2013  . Pelvic mass 10/31/2013  . DM type 2 (diabetes mellitus, type 2) 10/31/2013  . History of CHF (congestive heart failure) 10/31/2013  . Acute renal insufficiency 10/31/2013  . Unspecified hypothyroidism 10/31/2013   Past Medical History:  Past Medical History  Diagnosis Date  . CHF (congestive heart failure)   . Hypertension   . High cholesterol   . Thyroid disease   . Diabetes mellitus   . Back pain, chronic   . Renal disorder   . Obesity   . OSA on CPAP   . Edema    Past Surgical History:  Past Surgical History  Procedure Laterality Date  . Nephrolithotomy      Right  . Abdominal hysterectomy    . Eye surgery    . Bunionectomy      both feet  . Left knee surgery      Torn meniscus  . US echocardiography  12/24/2011    mild LVH,mild TR,trace MR,PI  . Laparoscopic ovarian cystectomy Right 11/01/2013    Procedure: LAPAROSCOPIC RIGHT OVARIAN CYSTECTOMY WITH COLLECTION OF WASHINGS ;  Surgeon: Abigail Dawley, MD;  Location: [facility];  Service: Gynecology;  Laterality: Right;  . Laparoscopic lysis of adhesions N/A 11/01/2013    Procedure: LAPAROSCOPIC LYSIS OF ADHESIONS;  Surgeon: Abigail Dawley, MD;  Location: [facility];  Service: Gynecology;  Laterality: N/A;  . Laparoscopy N/A 11/01/2013    Procedure: LAPAROSCOPY DIAGNOSTIC;  Surgeon: Abigail Dawley, MD;  Location: [facility];  Service: Gynecology;  Laterality: N/A;   HPI:  63 y.o. female with history of CVA Sept 2015 who was in rehabilitation brought to the ER after patient's daughter found the patient w/ altered mental status, difficult to arouse. MRI of the brain showed large left MCA stroke.  Per discussion with daughter, Abigail Johns, today her mother's communication is back to its baseline of aphasia c/b minimal verbal output, difficulty following commands.  She reports that her mother was eating a dysphagie 3 diet with thin liquids PTA.   Assessment / Plan / Recommendation Clinical Impression  Pt presents with aphasia and apraxia of speech - per daughter, she is back at baseline level of communication.  Pt produces stereotypy "four" (one of the only words she says spontaneously per daughter, Abigail Johns).  With visual cueing, she can repeat simple phrases and produce automatic sequences (counting, DOW), but she relies heavily on communication partner for lip placement cues, gestures.  She follows simple commands within context and with max cues.  Minimal spontaneous verbal output, but responds to yes/no questions with generally reliable head nod/shake if questions are related to personal needs.  Factual yes/no reliability at 50%.  Recommend acute SLP f/u to continue to address communication needs.  Best f/u venue of treatment tba pending OT and PT evals.     SLP Assessment  Patient needs continued Speech Lanaguage Pathology Services    Follow Up Recommendations   (tba)  Frequency and Duration min 3x week  2 weeks   Pertinent Vitals/Pain Pain Assessment: No/denies pain   SLP Goals  Potential to Achieve Goals: Good  SLP Evaluation Prior Functioning  Cognitive/Linguistic Baseline: Baseline deficits Baseline deficit details: aphasia  Lives With: Daughter Available Help at Discharge: Family   Cognition  Overall Cognitive Status: History of cognitive impairments - at  baseline Orientation Level: Oriented X4    Comprehension  Auditory Comprehension Overall Auditory Comprehension: Impaired at baseline Yes/No Questions: Impaired Basic Biographical Questions: 76-100% accurate Commands: Impaired One Step Basic Commands: 25-49% accurate Conversation: Simple Interfering Components: Motor planning;Processing speed EffectiveTechniques: Extra processing time;Pausing;Stressing words;Visual/Gestural cues Visual Recognition/Discrimination Discrimination: Exceptions to [facility] Common Objects: Able in field of 2 Reading Comprehension Reading Status: Not tested    Expression Expression Primary Mode of Expression: Nonverbal - gestures Verbal Expression Overall Verbal Expression: Impaired at baseline Initiation: Impaired Automatic Speech: Counting Level of Generative/Spontaneous Verbalization: Word Repetition: Impaired Naming: Impairment Responsive: 0-25% accurate Confrontation: Impaired Common Objects: Able in field of 2 Convergent: 0-24% accurate Divergent: 0-24% accurate Verbal Errors: Perseveration Pragmatics: No impairment Written Expression Dominant Hand: Right Written Expression: Not tested   Oral / Motor Oral Motor/Sensory Function Overall Oral Motor/Sensory Function: Appears within functional limits for tasks assessed Motor Speech Overall Motor Speech: Impaired Respiration: Within functional limits Phonation: Low vocal intensity Articulation: Impaired Motor Planning: Impaired Level of Impairment: Word Motor Speech Errors: Groping for words;Inconsistent   Abigail Johns L. Abigail Johns, [place] CCC/SLP Pager [phone]      Abigail Johns Abigail Johns 05/09/2014, 2:10 PM

## 2014-05-09 NOTE — Progress Notes (Signed)
Utilization review completed.  

## 2014-05-09 NOTE — Progress Notes (Signed)
PT Cancellation Note  Patient Details Name: Abigail Johns MRN: [id number] DOB: 01-28-1951   Cancelled Treatment:    Reason Eval/Treat Not Completed: Patient not medically ready (active bedrest orders at this time)   Duncan Dull 05/09/2014, 8:58 AM Alben Deeds, PT DPT  [phone]

## 2014-05-09 NOTE — Evaluation (Signed)
Clinical/Bedside Swallow Evaluation Patient Details  Name: Abigail Johns MRN: [id number] Date of Birth: 1950-10-29  Today's Date: 05/09/2014 Time: 1230-1245 SLP Time Calculation (min): 15 min  Past Medical History:  Past Medical History  Diagnosis Date  . CHF (congestive heart failure)   . Hypertension   . High cholesterol   . Thyroid disease   . Diabetes mellitus   . Back pain, chronic   . Renal disorder   . Obesity   . OSA on CPAP   . Edema    Past Surgical History:  Past Surgical History  Procedure Laterality Date  . Nephrolithotomy      Right  . Abdominal hysterectomy    . Eye surgery    . Bunionectomy      both feet  . Left knee surgery      Torn meniscus  . US echocardiography  12/24/2011    mild LVH,mild TR,trace MR,PI  . Laparoscopic ovarian cystectomy Right 11/01/2013    Procedure: LAPAROSCOPIC RIGHT OVARIAN CYSTECTOMY WITH COLLECTION OF WASHINGS ;  Surgeon: Ena Dawley, MD;  Location: [facility];  Service: Gynecology;  Laterality: Right;  . Laparoscopic lysis of adhesions N/A 11/01/2013    Procedure: LAPAROSCOPIC LYSIS OF ADHESIONS;  Surgeon: Ena Dawley, MD;  Location: [facility];  Service: Gynecology;  Laterality: N/A;  . Laparoscopy N/A 11/01/2013    Procedure: LAPAROSCOPY DIAGNOSTIC;  Surgeon: Ena Dawley, MD;  Location: [facility];  Service: Gynecology;  Laterality: N/A;   HPI:  63 y.o. female with history of CVA Sept 2015 who was in rehabilitation brought to the ER after patient's daughter found the patient w/ altered mental status, difficult to arouse. MRI of the brain showed large left MCA stroke.  Per discussion with daughter, [name], today her mother's communication is back to its baseline of aphasia c/b minimal verbal output, difficulty following commands.  She reports that her mother was eating a dysphagie 3 diet with thin liquids PTA.   Assessment / Plan / Recommendation Clinical Impression  Pt presents with a mild dysphagia, likely nearing baseline  function.  Mastication is prolonged with solid materials; there was intermittent mild throat-clearing associated with consumption of large thin liquid boluses, but no coughing.  Recommend initiating a dysphagia 3 diet with thin liquids; pills whole with puree.  Monitor with meals initially - if coughing, remove tray and call SLP.      Aspiration Risk  Mild    Diet Recommendation Dysphagia 3 (Mechanical Soft);Thin liquid   Liquid Administration via: Cup Medication Administration: Whole meds with puree Compensations: Slow rate;Small sips/bites Postural Changes and/or Swallow Maneuvers: Seated upright 90 degrees    Other  Recommendations Oral Care Recommendations: Oral care BID   Follow Up Recommendations   (continued SLP - venue tba)    Frequency and Duration min 3x week  2 weeks       SLP Swallow Goals     Swallow Study Prior Functional Status       General Date of Onset: 05/08/14 HPI: 62 y.o. female with history of CVA Sept 2015 who was in rehabilitation brought to the ER after patient's daughter found the patient w/ altered mental status, difficult to arouse. MRI of the brain showed large left MCA stroke.  Per discussion with daughter, [name], today her mother's communication is back to its baseline of aphasia c/b minimal verbal output, difficulty following commands.  She reports that her mother was eating a dysphagie 3 diet with thin liquids PTA. Type of Study: Bedside swallow evaluation Previous  Swallow Assessment: yes - records not available Diet Prior to this Study: NPO Temperature Spikes Noted: No Respiratory Status: Room air History of Recent Intubation: No Behavior/Cognition: Alert;Cooperative (aphasic) Self-Feeding Abilities: Needs assist Patient Positioning: Upright in bed Baseline Vocal Quality: Low vocal intensity Volitional Cough: Cognitively unable to elicit Volitional Swallow: Unable to elicit    Oral/Motor/Sensory Function Overall Oral Motor/Sensory  Function:  (mild right central CN VII involvement)   Ice Chips Ice chips: Within functional limits Presentation: Spoon   Thin Liquid Thin Liquid: Impaired Presentation: Spoon;Cup Pharyngeal  Phase Impairments: Throat Clearing - Delayed (intermittent)    Nectar Thick Nectar Thick Liquid: Not tested   Honey Thick Honey Thick Liquid: Not tested   Puree Puree: Within functional limits   Solid   Lamaya Hyneman L. [place], [place] CCC/SLP Pager [phone]     Solid: Impaired Presentation: Self Fed Oral Phase Impairments:  (prolonged mastication)       Kasheem Toner Laurice 05/09/2014,1:55 PM

## 2014-05-09 NOTE — Progress Notes (Signed)
TEAM 1 - Stepdown/ICU TEAM Progress Note  Abigail Johns ION:[id number] DOB: [birth year] DOA: 05/08/2014 PCP: Tivis Ringer, MD  Admit HPI / Brief Narrative: 63 y.o. female with history of CVA Sept 2015 who was in rehabilitation brought to the ER after patient's daughter found the patient w/ altered mental status, difficult to arouse. As per daughter patient was doing fine when she left her at 7:30 AM following which around 9 AM patient went to sleep. At around 1 PM patient's caregiver was trying to wake her up and found that patient was difficult to arouse.  Patient was brought to the ER. CT head suggested a possible acute CVA. On-call Neurologist Dr. Leonel Ramsay was consulted and patient had EEG. Eventually MRI of the brain showed large left MCA stroke.  HPI/Subjective: Pt is alert and communicative.  She c/o ongoing LLQ abdom pain.  She denies cp, sob, n/v, or abdom pain.    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% L MCA M1 segment stenosis - Neurology following and directing w/u   Hyperlipidemia LDL 120 - resume tx as soon as oral intake improved - note allergy to Lipitor / Crestor  LLQ abdom pain CT abdom w/o acute findings - possibly related to UTI - follow clinically   UTI UA markedly abnormal - send urine for cx - initiate empiric abx tx   DM2 CBG reasonably controlled - A1c pending   Hypothyroid  Cont home synthroid dose   OSA Noncompliant w/ CPAP   Hx of CHF - last EF unknown TTE pending - no clinical evidence of signif volume overload   Gout   Hepatic steatosis Noted on CT abdom   MRSA screen +  Code Status: FULL Family Communication: no family present at time of exam Disposition Plan: transfer to neuro floor on tele   Consultants: Neuro   Procedures: 10/13 EEG - focal L frontotemporal slowing - no seizure   Antibiotics: Rocephin 10/14 >  DVT prophylaxis: lovenox     Objective: Blood pressure 99/44, pulse 73, temperature 98.3 F (36.8 C), temperature source Oral, resp. rate 18, height 5\' 4"  (1.626 m), weight 103.7 kg (228 lb 9.9 oz), SpO2 94.00%.  Intake/Output Summary (Last 24 hours) at 05/09/14 1057 Last data filed at 05/09/14 0600  Gross per 24 hour  Intake 466.25 ml  Output      0 ml  Net 466.25 ml   Exam: General: No acute respiratory distress Lungs: Clear to auscultation bilaterally without wheezes or crackles Cardiovascular: Regular rate and rhythm without murmur gallop or rub normal S1 and S2 Abdomen: Tender to deep palpation in LLQ, nondistended, soft, bowel sounds positive, no rebound, no ascites, no appreciable mass Extremities: No significant cyanosis, clubbing, or edema bilateral lower extremities Neuro: alert and oriented - follows commands - 4/5 strenght B U and LE - lid droop on R   Data Reviewed: Basic Metabolic Panel:  Recent Labs Lab 05/08/14 1434 05/08/14 1439 05/09/14 0640  NA 139 138 141  K 4.3 4.0 4.2  CL 102 105 103  CO2 23  --  23  GLUCOSE 140* 139* 148*  BUN 14 14 16   CREATININE 0.75 0.90 0.81  CALCIUM 9.7  --  9.3    Liver Function Tests:  Recent Labs Lab 05/08/14 1434 05/09/14 0640  AST 24 22  ALT 32 33  ALKPHOS 109 103  BILITOT 0.4 0.4  PROT 7.4 6.9  ALBUMIN 3.7 3.4*  Coags:  Recent Labs Lab 05/08/14 1434  INR 1.17   CBC:  Recent Labs Lab 05/08/14 1434 05/08/14 1439 05/09/14 0640  WBC 6.6  --  7.8  NEUTROABS 3.6  --  4.9  HGB 14.7 16.0* 14.5  HCT 43.5 47.0* 42.3  MCV 93.1  --  92.2  PLT 270  --  253    CBG:  Recent Labs Lab 05/08/14 1448 05/08/14 2350 05/09/14 0429 05/09/14 0612 05/09/14 0732  GLUCAP 133* 124* 138* 151* 154*    Recent Results (from the past 240 hour(s))  MRSA PCR SCREENING     Status: Abnormal   Collection Time    05/08/14 11:48 PM      Result Value Ref Range Status   MRSA by PCR POSITIVE (*) NEGATIVE Final   Comment:            The  GeneXpert MRSA Assay (FDA     approved for NASAL specimens     only), is one component of a     comprehensive MRSA colonization     surveillance program. It is not     intended to diagnose MRSA     infection nor to guide or     monitor treatment for     MRSA infections.     RESULT CALLED TO, READ BACK BY AND VERIFIED WITH:     K.BISHOP,RN 0340 05/08/14 M.CAMPBELL     Studies:  Recent x-ray studies have been reviewed in detail by the Attending Physician  Scheduled Meds:  Scheduled Meds: . allopurinol  400 mg Oral Daily  . aspirin  300 mg Rectal Daily   Or  . aspirin  325 mg Oral Daily  . buPROPion  100 mg Oral TID  . colesevelam  1,875 mg Oral BID WC  . cycloSPORINE  1 drop Both Eyes BID  . enoxaparin (LOVENOX) injection  40 mg Subcutaneous Q24H  . ezetimibe  10 mg Oral Daily  . ferrous sulfate  325 mg Oral Q breakfast  . fluticasone  2 spray Each Nare Daily  . glyBURIDE  5 mg Oral Q supper  . insulin aspart  0-9 Units Subcutaneous TID WC  . levothyroxine  37.5 mcg Intravenous Daily  . methylphenidate  36 mg Oral Daily  . nebivolol  5 mg Oral Daily  . pantoprazole  40 mg Oral Daily    Time spent on care of this patient: 35 mins   MCCLUNG,JEFFREY T , MD   [facility] Office  [phone] Pager - Text Page per Shea Evans as per below:  On-Call/Text Page:      [email or web address]      password [email or web address]  If 7PM-7AM, please contact night-coverage [email or web address] Password [email or web address] 05/09/2014, 10:57 AM   LOS: 1 day

## 2014-05-09 NOTE — Progress Notes (Signed)
Pt TX to 4N-[id number], VSS, called report. Family present & aware of Odin.

## 2014-05-10 ENCOUNTER — Ambulatory Visit: Payer: BC Managed Care – PPO | Admitting: Cardiology

## 2014-05-10 DIAGNOSIS — I369 Nonrheumatic tricuspid valve disorder, unspecified: Secondary | ICD-10-CM

## 2014-05-10 DIAGNOSIS — E780 Pure hypercholesterolemia: Secondary | ICD-10-CM

## 2014-05-10 LAB — GLUCOSE, CAPILLARY
GLUCOSE-CAPILLARY: 128 mg/dL — AB (ref 70–99)
GLUCOSE-CAPILLARY: 144 mg/dL — AB (ref 70–99)
Glucose-Capillary: 142 mg/dL — ABNORMAL HIGH (ref 70–99)
Glucose-Capillary: 162 mg/dL — ABNORMAL HIGH (ref 70–99)

## 2014-05-10 MED ORDER — SODIUM CHLORIDE 0.9 % IV SOLN: INTRAVENOUS | Status: DC

## 2014-05-10 MED ORDER — MUPIROCIN CALCIUM 2 % EX CREA
TOPICAL_CREAM | Freq: Two times a day (BID) | CUTANEOUS | Status: DC
  Administered 2014-05-10: 1 via TOPICAL
  Administered 2014-05-10 – 2014-05-11 (×2): via TOPICAL
  Filled 2014-05-10: qty 15

## 2014-05-10 MED ORDER — NYSTATIN 100000 UNIT/GM EX CREA
TOPICAL_CREAM | Freq: Two times a day (BID) | CUTANEOUS | Status: DC
  Administered 2014-05-10: 13:00:00 via TOPICAL
  Administered 2014-05-10: 1 via TOPICAL
  Administered 2014-05-11: 11:00:00 via TOPICAL
  Filled 2014-05-10: qty 15

## 2014-05-10 NOTE — Progress Notes (Signed)
  Echocardiogram 2D Echocardiogram has been performed.  Mauricio Po 05/10/2014, 5:35 PM

## 2014-05-10 NOTE — Evaluation (Signed)
Occupational Therapy Evaluation Patient Details Name: Abigail Johns MRN: [id number] DOB: 1950-12-30 Today's Date: 05/10/2014    History of Present Illness Abigail Johns is a 63 y.o. female with history of recent CVA last month and presently in rehabilitation was brought to the ER after patient's daughter found the patient was altered mental status and difficult to arouse.    Clinical Impression   PT admitted with CT reveals hemorrhage in the parenchyma of the lateral L temporal lobe. Pt currently with functional limitiations due to the deficits listed below (see OT problem list). Pt from home and with (A) at baseline.  Pt will benefit from skilled OT to increase their independence and safety with adls and balance to allow discharge [place].   Follow Up Recommendations  Home health OT;Supervision/Assistance - 24 hour    Equipment Recommendations  None recommended by OT    Recommendations for Other Services       Precautions / Restrictions Precautions Precautions: Fall      Mobility Bed Mobility Overal bed mobility: Needs Assistance Bed Mobility: Supine to Sit     Supine to sit: Supervision     General bed mobility comments: up to recliner with min guard (A)  Transfers Overall transfer level: Needs assistance Equipment used: Rolling walker (2 wheeled) Transfers: Sit to/from Stand Sit to Stand: Min guard              Balance Overall balance assessment: Needs assistance         Standing balance support: During functional activity;Bilateral upper extremity supported Standing balance-Leahy Scale: Fair Standing balance comment: with RW                            ADL Overall ADL's : At baseline                                             Vision                 Additional Comments: Pt asked to scan a handout and locate items. pt able to correct identify 3 out 5 request. Question global aphasia deficits. pt demonstates  receptive deficits with session   Perception     Praxis      Pertinent Vitals/Pain Pain Assessment: No/denies pain     Hand Dominance Right   Extremity/Trunk Assessment Upper Extremity Assessment Upper Extremity Assessment: RUE deficits/detail RUE Deficits / Details: shoulder flexion ~45 degrees, 3 _ out 5 demonstrated , grasp brunstrom III       RUE Coordination: decreased fine motor;decreased gross motor   Lower Extremity Assessment Lower Extremity Assessment: Defer to PT evaluation RLE Deficits / Details: R LE 3+ to 4-/5   Cervical / Trunk Assessment Cervical / Trunk Assessment: Normal   Communication Communication Communication: Receptive difficulties;Expressive difficulties   Cognition Arousal/Alertness: Awake/alert Behavior During Therapy: WFL for tasks assessed/performed Overall Cognitive Status: No family/caregiver present to determine baseline cognitive functioning                     General Comments       Exercises       Shoulder Instructions      Home Living Family/patient expects to be discharged to:: Private residence Living Arrangements: Children Available Help at Discharge: Available 24 hours/day Type of Home: House Home Access: Level entry  Home Layout: Two level Alternate Level Stairs-Number of Steps: 4 steps to get to bedroom-? Alternate Level Stairs-Rails: Right           Home Equipment: Environmental consultant - 2 wheels      Lives With: Daughter    Prior Functioning/Environment Level of Independence: Needs assistance  Gait / Transfers Assistance Needed: Pt amb with RW with someone with her ADL's / Homemaking Assistance Needed: (A) from sitter daughter adn RN   Comments: communication impaired and PTA hx obtained from chart and limited communication from patient    OT Diagnosis: Generalized weakness;Cognitive deficits   OT Problem List:     OT Treatment/Interventions:      OT Goals(Current goals can be found in the care plan  section) Acute Rehab OT Goals Patient Stated Goal: none stated OT Goal Formulation: Patient unable to participate in goal setting Potential to Achieve Goals: Good  OT Frequency:     Barriers to D/C:            Co-evaluation              End of Session Equipment Utilized During Treatment: Gait belt Nurse Communication: Mobility status;Precautions  Activity Tolerance: Patient tolerated treatment well Patient left: in chair;with call bell/phone within reach;with chair alarm set   Time:  -    Charges:    Late charge entry added to progress notes G-Codes:    Parke Poisson B 2014/06/05, 1:53 PM  Late entry to the chart at this time - note was pended and not being released by evaluating therapist

## 2014-05-10 NOTE — Progress Notes (Signed)
[place] TEAM 1 - Stepdown/ICU TEAM Progress Note  Abigail Johns UVO:[id number] DOB: 10/21/1950 DOA: 05/08/2014 PCP: Tivis Ringer, MD  Admit HPI / Brief Narrative: 63 y.o. female with history of CVA Sept 2015 who was in rehabilitation brought to the ER after patient's daughter found the patient w/ altered mental status, difficult to arouse. As per daughter patient was doing fine when she left her at 7:30 AM following which around 9 AM patient went to sleep. At around 1 PM patient's caregiver was trying to wake her up and found that patient was difficult to arouse.  Patient was brought to the ER. CT head suggested a possible acute CVA. On-call Neurologist Dr. Leonel Ramsay was consulted and patient had EEG. Eventually MRI of the brain showed large left MCA stroke.  HPI/Subjective: Pt is alert and communicative.  She c/o ongoing LLQ abdom pain.  She denies cp, sob, n/v, or abdom pain.    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 Cardiology consulted, will likely undergo TEE in AM.   Hyperlipidemia LDL 120 - resume tx as soon as oral intake improved - note allergy to Lipitor / Crestor  LLQ abdom pain CT abdom w/o acute findings - possibly related to UTI - follow clinically   UTI UA markedly abnormal - send urine for cx - initiate empiric abx tx  Urine culture pending  DM2 CBG reasonably controlled   Hypothyroid  Cont home synthroid dose   OSA Noncompliant w/ CPAP   Hx of CHF - last EF unknown TTE pending - no clinical evidence of signif volume overload   Gout   Hepatic steatosis Noted on CT abdom   MRSA screen +  Code Status: FULL Family Communication: no family present at time of exam Disposition Plan: PT recommending home health PT  Consultants: Neuro   Procedures: 10/13 EEG - focal L frontotemporal slowing - no  seizure   Antibiotics: Rocephin 10/14 >  DVT prophylaxis: lovenox   Objective: Blood pressure 134/57, pulse 87, temperature 97.4 F (36.3 C), temperature source Oral, resp. rate 20, height 5\' 4"  (1.626 m), weight 103.7 kg (228 lb 9.9 oz), SpO2 97.00%.  Intake/Output Summary (Last 24 hours) at 05/10/14 1702 Last data filed at 05/10/14 1318  Gross per 24 hour  Intake    650 ml  Output      0 ml  Net    650 ml   Exam: General: No acute respiratory distress Lungs: Clear to auscultation bilaterally without wheezes or crackles Cardiovascular: Regular rate and rhythm without murmur gallop or rub normal S1 and S2 Abdomen: Tender to deep palpation in LLQ, nondistended, soft, bowel sounds positive, no rebound, no ascites, no appreciable mass Extremities: No significant cyanosis, clubbing, or edema bilateral lower extremities Neuro: alert and oriented - follows commands - 4/5 strenght B U and LE - lid droop on R   Data Reviewed: Basic Metabolic Panel:  Recent Labs Lab 05/08/14 1434 05/08/14 1439 05/09/14 0640  NA 139 138 141  K 4.3 4.0 4.2  CL 102 105 103  CO2 23  --  23  GLUCOSE 140* 139* 148*  BUN 14 14 16   CREATININE 0.75 0.90 0.81  CALCIUM 9.7  --  9.3    Liver Function Tests:  Recent Labs Lab 05/08/14 1434 05/09/14 0640  AST 24 22  ALT 32 33  ALKPHOS 109 103  BILITOT  0.4 0.4  PROT 7.4 6.9  ALBUMIN 3.7 3.4*   Coags:  Recent Labs Lab 05/08/14 1434  INR 1.17   CBC:  Recent Labs Lab 05/08/14 1434 05/08/14 1439 05/09/14 0640  WBC 6.6  --  7.8  NEUTROABS 3.6  --  4.9  HGB 14.7 16.0* 14.5  HCT 43.5 47.0* 42.3  MCV 93.1  --  92.2  PLT 270  --  253    CBG:  Recent Labs Lab 05/09/14 1809 05/09/14 2138 05/10/14 0635 05/10/14 1133 05/10/14 1559  GLUCAP 122* 175* 128* 142* 162*    Recent Results (from the past 240 hour(s))  MRSA PCR SCREENING     Status: Abnormal   Collection Time    05/08/14 11:48 PM      Result Value Ref Range Status    MRSA by PCR POSITIVE (*) NEGATIVE Final   Comment:            The GeneXpert MRSA Assay (FDA     approved for NASAL specimens     only), is one component of a     comprehensive MRSA colonization     surveillance program. It is not     intended to diagnose MRSA     infection nor to guide or     monitor treatment for     MRSA infections.     RESULT CALLED TO, READ BACK BY AND VERIFIED WITH:     K.BISHOP,RN 0340 05/08/14 M.CAMPBELL     Studies:  Recent x-ray studies have been reviewed in detail by the Attending Physician  Scheduled Meds:  Scheduled Meds: . allopurinol  400 mg Oral Daily  . aspirin  325 mg Oral Daily  . buPROPion  100 mg Oral TID  . Canagliflozin  1 tablet Oral Daily  . cefTRIAXone (ROCEPHIN)  IV  1 g Intravenous Q24H  . colesevelam  1,875 mg Oral BID WC  . cycloSPORINE  1 drop Both Eyes BID  . enoxaparin (LOVENOX) injection  40 mg Subcutaneous Q24H  . ezetimibe  10 mg Oral Daily  . fluticasone  2 spray Each Nare Daily  . gabapentin  600 mg Oral QHS  . insulin aspart  0-9 Units Subcutaneous TID WC  . levothyroxine  75 mcg Oral QAC breakfast  . methylphenidate  36 mg Oral Daily  . mupirocin cream   Topical BID  . nebivolol  5 mg Oral Daily  . nystatin cream   Topical BID  . pantoprazole  40 mg Oral Daily  . Vitamin D (Ergocalciferol)  50,000 Units Oral Once per day on Mon Thu    Time spent on care of this patient: 25 mins   Kelvin Cellar , MD   [facility] Office  [phone] Pager - Text Page per Shea Evans as per below:  On-Call/Text Page:      [email or web address]      password [email or web address]  If 7PM-7AM, please contact night-coverage [email or web address] Password [email or web address] 05/10/2014, 5:02 PM   LOS: 2 days

## 2014-05-10 NOTE — Progress Notes (Signed)
STROKE TEAM PROGRESS NOTE   HISTORY Abigail Johns is a 63 y.o. female PMH of HTN, CHF, HLD, thyroid disease, right glass eye, OSA, and recent stroke in 9/15 at [facility] in [place], [place] that left her with residual right side hemiparesis. Pt had apparently been fully functional and independent before stroke last month. Pt was then transferred to a rehab center in [facility] and had been able to perform some simple ADLs but still had continued right sided hemiparesis before coming home for a couple of weeks but had normal speech and mentation. The daughter reports that her mother had been complaining of some vague abdominal pain for the previous 2 days that wasn't resolved with alka seltzer. The daughter left for work at 48AM and stated her mother was in her normal state of health but still complaining of some abdominal pain. Apparently when the pt sister and nurse came to care for the patient she was not responsive. The didn't note any urinary or stool incontinence or random movements. The patient had apparently taken all her medications that AM and she wasn't on any anticoagulation or ASA. EMS was then called by the pt's daughter where she continued to be non responsive, with right sided hemiparesis, and some rapid eye movement. Good clinical exam was difficult as pt altered to the point of inability to participate in exam for EMS. No frank seizure like activity seen. Code stroke was called. She was LKW at  [facility] 05/08/2014. Patient was not administered TPA secondary to out of window and stroke within past 90 days. She was admitted to step down for further evaluation and treatment.   SUBJECTIVE (INTERVAL HISTORY) Patient transferred to 4N since yesterday. No family at bedside. Her condition is stable, there is no confusion episodes. [place] medical record not able to obtain so far. Will do further work up as indicated.   OBJECTIVE Temp:  [97.7 F (36.5 C)-98.6 F (37 C)] 98.6 F (37  C) (10/15 1017) Pulse Rate:  [66-84] 80 (10/15 1017) Cardiac Rhythm:  [-] Normal sinus rhythm (10/14 2010) Resp:  [18-20] 18 (10/15 1017) BP: (102-150)/(39-59) 121/53 mmHg (10/15 1017) SpO2:  [94 %-99 %] 98 % (10/15 1017)   Recent Labs Lab 05/09/14 0732 05/09/14 1312 05/09/14 1809 05/09/14 2138 05/10/14 0635  GLUCAP 154* 170* 122* 175* 128*    Recent Labs Lab 05/08/14 1434 05/08/14 1439 05/09/14 0640  NA 139 138 141  K 4.3 4.0 4.2  CL 102 105 103  CO2 23  --  23  GLUCOSE 140* 139* 148*  BUN 14 14 16   CREATININE 0.75 0.90 0.81  CALCIUM 9.7  --  9.3    Recent Labs Lab 05/08/14 1434 05/09/14 0640  AST 24 22  ALT 32 33  ALKPHOS 109 103  BILITOT 0.4 0.4  PROT 7.4 6.9  ALBUMIN 3.7 3.4*    Recent Labs Lab 05/08/14 1434 05/08/14 1439 05/09/14 0640  WBC 6.6  --  7.8  NEUTROABS 3.6  --  4.9  HGB 14.7 16.0* 14.5  HCT 43.5 47.0* 42.3  MCV 93.1  --  92.2  PLT 270  --  253   No results found for this basename: CKTOTAL, CKMB, CKMBINDEX, TROPONINI,  in the last 168 hours  Recent Labs  05/08/14 1434  LABPROT 14.9  INR 1.17    Recent Labs  05/08/14 [place] 1.012  PHURINE 5.0  [place]  PROTEINUR NEGATIVE  UROBILINOGEN 0.2  NITRITE NEGATIVE  LEUKOCYTESUR MODERATE*       Component Value Date/Time   CHOL 200 05/09/2014 0640   TRIG 198* 05/09/2014 0640   HDL 40 05/09/2014 0640   CHOLHDL 5.0 05/09/2014 0640   VLDL 40 05/09/2014 0640   LDLCALC 120* 05/09/2014 0640   Lab Results  Component Value Date   HGBA1C 7.3* 05/09/2014      Component Value Date/Time   LABOPIA NONE DETECTED 05/08/2014 1722   COCAINSCRNUR NONE DETECTED 05/08/2014 1722   LABBENZ NONE DETECTED 05/08/2014 1722   AMPHETMU NONE DETECTED 05/08/2014 1722   THCU NONE DETECTED 05/08/2014 1722   LABBARB NONE DETECTED 05/08/2014 1722     Recent Labs Lab 05/08/14 1434  ETH <11    Ct  Abdomen Pelvis Wo Contrast 05/09/2014    1. No acute intra-abdominal findings. 2. Nonobstructive bilateral nephrolithiasis. 3. Hepatic steatosis.   Dg Chest 2 View 05/09/2014    Low lung volumes with bibasilar atelectasis.     Ct Head Wo Contrast 05/09/2014   1. Grossly unchanged ill-defined left-sided periventricular hypodensities compatible with the known acute infarct demonstrated on brain MRI performed day prior. No CT evidence of hemorrhagic conversion or new acute large territory infarct. 2. Increased attenuation of multiple sulci within the left temporal lobe compatible with mineralization as demonstrated on prior brain MRI.    05/08/2014   There is hemorrhage in the parenchyma of the lateral left temporal lobe. This appearance raises question of amyloid angiography. There is a questionable acute small infarct in the anterior limb of the left internal capsule. There is atrophy with patchy periventricular small vessel disease. There is no subdural or epidural fluid. 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    It should be noted that the patient by report had a CVA approximately 1 month ago. It is possible that the apparent hemorrhage in the left temporal region actually represents calcification as a reactive response to prior infarct. It is possible that there is both calcification and hemorrhage in this area. Given this circumstance, MR could be most helpful for further assessment with respect to these differential considerations.  Mr Brain Wo Contrast 05/08/2014   Moderately extensive acute LEFT subcortical white matter infarction.  Abnormal LEFT temporal lobe on CT consistent with mineralization of a subacute insult. No parenchymal hemorrhage is evident.    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     2D Echocardiogram  No cardiac source of emboli was indentified. EF 60-65%   Carotid Doppler  No evidence  of hemodynamically significant internal carotid artery stenosis. Vertebral artery flow is antegrade.   EEG - Clinical Interpretation: This EEG demonstrates evidence of a left frontotemporal cerebral dysfunction that is etiologically nonspecific. There was no seizure or seizure predisposition recorded on this study.    PHYSICAL EXAM  Temp:  [97.7 F (36.5 C)-98.6 F (37 C)] 98.6 F (37 C) (10/15 1017) Pulse Rate:  [66-84] 80 (10/15 1017) Resp:  [18-20] 18 (10/15 1017) BP: (102-150)/(39-59) 121/53 mmHg (10/15 1017) SpO2:  [94 %-99 %] 98 % (10/15 1017)  General - Well nourished, well developed, in no apparent distress.  Ophthalmologic - right prosthetic glass eye, left eye not able to see through.  Cardiovascular - Regular rate and rhythm with no murmur.  Mental Status -  Awake alert not able to answer orientation questions due to aphasia. Language impaired, able  to have some words out but not full sentences, follow simple commands but not complex commands, naming and repetition deficit.    Cranial Nerves II - XII - II - not blinking to threat on the left. Right eye prosthetic.  III, IV, VI - Extraocular movements intact. V - Facial sensation intact bilaterally. VII - left facial droop. VIII - Hearing & vestibular intact bilaterally. X - Palate elevates symmetrically. XI - Chin turning & shoulder shrug intact bilaterally. XII - Tongue protrusion intact.  Motor Strength - The patient's strength was 5-/5 right UE and LE, 5/5 left UE and LE and pronator drift was absent.  Bulk was normal and fasciculations were absent.   Motor Tone - Muscle tone was assessed at the neck and appendages and was normal.  Reflexes - The patient's reflexes were 1+ in all extremities and she had no pathological reflexes.  Sensory - Light touch, temperature/pinprick were assessed and were normal.    Coordination - The patient had normal movements in the hands with no ataxia or dysmetria.  Tremor was  absent.  Gait and Station - not tested.   ASSESSMENT/PLAN Abigail Johns is a 63 y.o. female with history of HTN, CHF, HLD, thyroid disease, right glass eye, OSA, and recent L temporal stroke in 9/15 that left her with residual right side hemiparesis found unresponsive at home. She did not receive IV t-PA due to delay in arrival and stroke within past 90 days.    Acute left corona radiata stroke and subacute L temporal lobe infarct Sept 2015 with lamina necrosis - unclear etiology. The acute stroke maybe due to hypotension in the setting of high grade M1 stenosis. ? Additional workup needed. Have requested copy of records from [facility] in [place]. However, her presentation is also suspicious for seizure which is common after cortical stroke.   MRI  Acute left corona radiata stroke with subacute L temporal lobe infarct. Temporal area of stroke often associated with post stroke seizure.  MRA  L MCA M1 90% stenosis - etiology not clear  Carotid Doppler  No significant stenosis   2D Echo  unremarkable  F/u medical record in [facility]   TEE to look for embolic source. Arranged with [place] for tomorrow.  If positive for PFO (patent foramen ovale), check bilateral lower extremity venous dopplers to rule out DVT as possible source of stroke. (I have made patient NPO after midnight tonight).   If TEE negative, a [facility] electrophysiologist will consult and consider placement of an implantable loop recorder to evaluate for atrial fibrillation as etiology of stroke. This has been explained to patient/family by Dr. Erlinda Hong and they are agreeable.   Lovenox 40 mg sq daily for VTE prophylaxis   Dysphagia  diet  OOB with assistance  no antithrombotics prior to admission, now on ASA 325mg  daily  Risk factor education  Ongoing aggressive risk factor management  Therapy recommendations:  pending   Disposition:  pending   ?  Seizure  - EEG showed left frontotemporal slowing and no seizure - but it was done when pt almost back to her previous baseline. - recommend keppra 500mg  bid (added) - According to [place] law, pt can not drive until seizure free for 6 months and under physician's care.  - Maintain seizure precautions.   Hypertension  Home meds: bystolic  Stable  Permissive hypertension for 24-48 hours - OK if < 220/120. Gradually normalize in 5-7 days.  Hyperlipidemia  Home meds:  zetia and welchol, intolerant to lipitor and crestor  LDL 120  No statin d/t intolerance  Continue meds at discharge  Diabetes  HgbA1c 7.3 goal < 7.0  On metformin and glyburide at home  Uncontrolled but improving  DM education  Other Stroke Risk Factors Former Cigarette smoker, quit in 2003   Obesity, Body mass index is 39.22 kg/(m^2).    Coronary artery disease - MI Obstructive sleep apnea, noncompliant with CPAP at home  Other Active Problems  UTI  abd pain, ? R/t UTI  Thyroid disease  Chronic back pain  Other Pertinent History  Hx CHF  Gout  MRSA +  Hospital day # 2  SHARON BIBY, MSN, RN, ANVP-BC, ANP-BC, [facility] Pager: [phone] 05/10/2014 11:06 AM   I, the attending vascular neurologist, have personally obtained a history, examined the patient, evaluated laboratory data, individually viewed imaging studies, and formulated the assessment and plan of care.  I have made any additions or clarifications directly to the above note and agree with the findings and plan as currently documented.   Rosalin Hawking, MD PhD Stroke Neurology 05/10/2014 10:05 PM   To contact Stroke Continuity provider, please refer to [email or web address] After hours, contact General Neurology

## 2014-05-10 NOTE — Evaluation (Signed)
Physical Therapy Evaluation Patient Details Name: Abigail Johns MRN: [id number] DOB: 19-Dec-1950 Today's Date: 05/10/2014   History of Present Illness  Bernette Mura is a 63 y.o. female with history of recent CVA last month and presently in rehabilitation was brought to the ER after patient's daughter found the patient was altered mental status and difficult to arouse.   Clinical Impression  Pt admitted with possible CVA. Pt currently with functional limitations due to the deficits listed below (see PT Problem List).  Pt will benefit from skilled PT to increase their independence and safety with mobility to allow discharge to the venue listed below. Pt with expressive aphasia making some communication difficult.  If family/cergivers can provide 24 hour/S then home with HHPT.      Follow Up Recommendations Home health PT    Equipment Recommendations  None recommended by PT    Recommendations for Other Services       Precautions / Restrictions        Mobility  Bed Mobility               General bed mobility comments: Pt up in recliner upon arrival  Transfers Overall transfer level: Needs assistance Equipment used: Rolling walker (2 wheeled) Transfers: Sit to/from Stand Sit to Stand: Min guard            Ambulation/Gait Ambulation/Gait assistance: Min guard Ambulation Distance (Feet): 35 Feet Assistive device: Rolling walker (2 wheeled) Gait Pattern/deviations: Decreased step length - right     General Gait Details: Pt fatigued after gait.  Stairs            Wheelchair Mobility    Modified Rankin (Stroke Patients Only) Modified Rankin (Stroke Patients Only) Pre-Morbid Rankin Score: Moderately severe disability Modified Rankin: Moderately severe disability     Balance Overall balance assessment: Needs assistance         Standing balance support: Bilateral upper extremity supported Standing balance-Leahy Scale: Fair Standing balance  comment: with RW                             Pertinent Vitals/Pain  No reports of pain.    Home Living Family/patient expects to be discharged to:: Private residence Living Arrangements: Children Available Help at Discharge: Available 24 hours/day Type of Home: House Home Access: Level entry     Home Layout: Two level [place]: Environmental consultant - 2 wheels      Prior Function Level of Independence: Needs assistance   Gait / Transfers Assistance Needed: Pt amb with RW with someone with her     Comments: Pt wiht expresive aphasia, making getting prior level difficult.     Hand Dominance        Extremity/Trunk Assessment   Upper Extremity Assessment: Defer to OT evaluation           Lower Extremity Assessment: RLE deficits/detail RLE Deficits / Details: R LE 3+ to 4-/5       Communication   Communication: Expressive difficulties  Cognition Arousal/Alertness: Awake/alert Behavior During Therapy: WFL for tasks assessed/performed Overall Cognitive Status: No family/caregiver present to determine baseline cognitive functioning                      General Comments      Exercises        Assessment/Plan    PT Assessment Patient needs continued PT services  PT Diagnosis Generalized weakness;Hemiplegia dominant side   PT Problem  List Decreased strength;Decreased balance;Decreased mobility;Decreased activity tolerance;Decreased knowledge of use of DME  PT Treatment Interventions Gait training;Stair training;Functional mobility training;Therapeutic activities;Therapeutic exercise;DME instruction   PT Goals (Current goals can be found in the Care Plan section) Acute Rehab PT Goals Patient Stated Goal: None stated, but agreeable to PT PT Goal Formulation: With patient Time For Goal Achievement: 05/24/14 Potential to Achieve Goals: Good    Frequency Min 4X/week   Barriers to discharge        Co-evaluation               End of  Session Equipment Utilized During Treatment: Gait belt Activity Tolerance: Patient tolerated treatment well;Patient limited by fatigue Patient left: in chair;with chair alarm set;with call bell/phone within reach;Other (comment) (with lunch tray) Nurse Communication: Mobility status         Time: 1208-1227 PT Time Calculation (min): 19 min   Charges:   PT Evaluation $Initial PT Evaluation Tier I: 1 Procedure PT Treatments $Gait Training: 8-22 mins   PT G Codes:          Petro Talent LUBECK 05/10/2014, 12:41 PM

## 2014-05-11 ENCOUNTER — Encounter (HOSPITAL_COMMUNITY)
Admission: EM | Disposition: A | Discharge status: Home Health | Payer: Self-pay | Source: Home / Self Care | Attending: Internal Medicine

## 2014-05-11 ENCOUNTER — Encounter (HOSPITAL_COMMUNITY): Payer: Self-pay | Admitting: Cardiology

## 2014-05-11 ENCOUNTER — Other Ambulatory Visit: Payer: Self-pay | Admitting: Neurology

## 2014-05-11 DIAGNOSIS — I639 Cerebral infarction, unspecified: Secondary | ICD-10-CM

## 2014-05-11 DIAGNOSIS — I63412 Cerebral infarction due to embolism of left middle cerebral artery: Secondary | ICD-10-CM

## 2014-05-11 DIAGNOSIS — R569 Unspecified convulsions: Secondary | ICD-10-CM

## 2014-05-11 HISTORY — PX: TEE WITHOUT CARDIOVERSION: SHX5443

## 2014-05-11 HISTORY — PX: LOOP RECORDER IMPLANT: SHX5477

## 2014-05-11 LAB — URINE CULTURE
Colony Count: NO GROWTH
Culture: NO GROWTH

## 2014-05-11 LAB — GLUCOSE, CAPILLARY
GLUCOSE-CAPILLARY: 125 mg/dL — AB (ref 70–99)
Glucose-Capillary: 140 mg/dL — ABNORMAL HIGH (ref 70–99)
Glucose-Capillary: 108 mg/dL — ABNORMAL HIGH (ref 70–99)

## 2014-05-11 SURGERY — ECHOCARDIOGRAM, TRANSESOPHAGEAL
Anesthesia: Moderate Sedation

## 2014-05-11 SURGERY — LOOP RECORDER IMPLANT
Anesthesia: LOCAL

## 2014-05-11 MED ORDER — FENTANYL CITRATE 0.05 MG/ML IJ SOLN
INTRAMUSCULAR | Status: DC | PRN
  Administered 2014-05-11 (×2): 25 ug via INTRAVENOUS

## 2014-05-11 MED ORDER — SODIUM CHLORIDE 0.9 % IV SOLN
INTRAVENOUS | Status: DC
Start: 2014-05-11 — End: 2014-05-12
  Administered 2014-05-11: 12:00:00 via INTRAVENOUS

## 2014-05-11 MED ORDER — LEVETIRACETAM 500 MG PO TABS
500.0000 mg | ORAL_TABLET | Freq: Two times a day (BID) | ORAL | Status: DC
  Administered 2014-05-11: 500 mg via ORAL
  Filled 2014-05-11: qty 1

## 2014-05-11 MED ORDER — MUPIROCIN CALCIUM 2 % EX CREA: TOPICAL_CREAM | Freq: Two times a day (BID) | CUTANEOUS | Status: AC

## 2014-05-11 MED ORDER — SODIUM CHLORIDE 0.9 % IV SOLN
INTRAVENOUS | Status: DC
  Administered 2014-05-11: 12:00:00 via INTRAVENOUS

## 2014-05-11 MED ORDER — FENTANYL CITRATE 0.05 MG/ML IJ SOLN
INTRAMUSCULAR | Status: AC
  Filled 2014-05-11: qty 2

## 2014-05-11 MED ORDER — MIDAZOLAM HCL 5 MG/ML IJ SOLN
INTRAMUSCULAR | Status: AC
  Filled 2014-05-11: qty 2

## 2014-05-11 MED ORDER — MIDAZOLAM HCL 10 MG/2ML IJ SOLN
INTRAMUSCULAR | Status: DC | PRN
  Administered 2014-05-11 (×2): 2 mg via INTRAVENOUS
  Administered 2014-05-11: 1 mg via INTRAVENOUS

## 2014-05-11 MED ORDER — LEVETIRACETAM 500 MG PO TABS: 500.0000 mg | ORAL_TABLET | Freq: Two times a day (BID) | ORAL | Status: DC

## 2014-05-11 MED ORDER — LIDOCAINE-EPINEPHRINE 1 %-1:100000 IJ SOLN
INTRAMUSCULAR | Status: AC
  Filled 2014-05-11: qty 1

## 2014-05-11 MED ORDER — BUTAMBEN-TETRACAINE-BENZOCAINE 2-2-14 % EX AERO
INHALATION_SPRAY | CUTANEOUS | Status: DC | PRN
  Administered 2014-05-11: 2 via TOPICAL

## 2014-05-11 MED ORDER — ASPIRIN 325 MG PO TABS: 325.0000 mg | ORAL_TABLET | Freq: Every day | ORAL | Status: DC

## 2014-05-11 NOTE — Progress Notes (Signed)
PT Cancellation Note  Patient Details Name: Bronnie Vasseur MRN: [id number] DOB: 09-Jan-1951   Cancelled Treatment:    Reason Eval/Treat Not Completed: Patient at procedure or test/unavailable; heading down for endoscopy.  Will attempt later if time permits.   WYNN,CYNDI 05/11/2014, 1:53 PM

## 2014-05-11 NOTE — Consult Note (Addendum)
Reason for Consult: Possible Linq   Referring Physician: Dr. Erlinda Hong PCP:  Tivis Ringer, MD Primary Cardiologist: Abigail Johns is an 63 y.o. female.    Chief Complaint:  Admitted 05/08/14 with altered mental status    HPI: 63 y.o. female with history of recent CVA last month in [place], [place] and presently in rehabilitation was brought to the ER after patient's daughter found the patient was altered mental status and difficult to arouse. As per patient's daughter patient was doing fine when she left her in the morning 7:30 AM following which around 9 AM patient went to sleep. At around 1 PM patient's caregiver was trying to wake her up and was found that patient was difficult to arouse and patient was brought to the ER. CT head showed possible uptake in from with possible hemorrhage. On-call neurologist Dr. Leonel Ramsay was consulted and patient also had EEG to make sure patient was not in status. Eventually MRI of the brain was done which showed large left MCA stroke. Patient has been admitted for further management. As per the daughter patient has become more alert awake after 7 PM. At this time patient still has some fascia which patient states is from a previous stroke. Patient also has mild right-sided weakness. Code stroke called.  Patient was not administered TPA secondary to out of window and stroke within past 90 days.  She does have OSA. I called [employer] and she has no record of LINQ from previous stroke.  Echo this admit: - Left ventricle: The cavity size was normal. Systolic function was normal. The estimated ejection fraction was in the range of 60% to 65%. Wall motion was normal; there were no regional wall motion abnormalities. Doppler parameters are consistent with abnormal left ventricular relaxation (grade 1 diastolic dysfunction). There was no evidence of elevated ventricular filling pressure by Doppler parameters. - Aortic valve: Trileaflet;  normal thickness leaflets. There was no regurgitation. - Aortic root: The aortic root was normal in size. - Mitral valve: There was no regurgitation. - Left atrium: The atrium was normal in size. - Right ventricle: Systolic function was normal. - Right atrium: The atrium was normal in size. - Tricuspid valve: There was mild regurgitation. - Pulmonary arteries: Systolic pressure was within the normal range. - Inferior vena cava: The vessel was normal in size. - Pericardium, extracardiac: There was no pericardial effusion. Impressions: - No cardiac source of emboli was indentified  Plan is for TEE today and if no thrombus then proceed with LINQ.  She has been NPO for TEE.  Other hx as below. No chest pain, no SOB.  + aphasia. No tachycardia she is aware of, no palpitations    Past Medical History  Diagnosis Date  . CHF (congestive heart failure)   . Hypertension   . High cholesterol   . Thyroid disease   . Diabetes mellitus   . Back pain, chronic   . Renal disorder   . Obesity   . OSA on CPAP   . Edema     Past Surgical History  Procedure Laterality Date  . Nephrolithotomy      Right  . Abdominal hysterectomy    . Eye surgery    . Bunionectomy      both feet  . Left knee surgery      Torn meniscus  . US echocardiography  12/24/2011    mild LVH,mild TR,trace MR,PI  . Laparoscopic ovarian cystectomy Right 11/01/2013  Procedure: LAPAROSCOPIC RIGHT OVARIAN CYSTECTOMY WITH COLLECTION OF WASHINGS ;  Surgeon: Ena Dawley, MD;  Location: [facility];  Service: Gynecology;  Laterality: Right;  . Laparoscopic lysis of adhesions N/A 11/01/2013    Procedure: LAPAROSCOPIC LYSIS OF ADHESIONS;  Surgeon: Ena Dawley, MD;  Location: [facility];  Service: Gynecology;  Laterality: N/A;  . Laparoscopy N/A 11/01/2013    Procedure: LAPAROSCOPY DIAGNOSTIC;  Surgeon: Ena Dawley, MD;  Location: [facility];  Service: Gynecology;  Laterality: N/A;    Family History  Problem Relation Age of  Onset  . Diabetes Mother   . Hyperlipidemia Mother   . Hypertension Mother   . Hypertension Father   . Diabetes Father   . Cancer Father   . Heart attack Brother   . Hyperlipidemia Brother   . Hypertension Brother    Social History:  reports that she quit smoking about 11 years ago. She has never used smokeless tobacco. She reports that she does not drink alcohol or use illicit drugs.  Allergies:  Allergies  Allergen Reactions  . Lipitor [Atorvastatin Calcium] Other (See Comments)    Myocitis  . Crestor [Rosuvastatin]     myalgias    Medications Prior to Admission  Medication Sig Dispense Refill  . allopurinol (ZYLOPRIM) 300 MG tablet Take 400 mg by mouth daily.       Marland Kitchen buPROPion (WELLBUTRIN) 100 MG tablet Take 100 mg by mouth 3 (three) times daily.      . Canagliflozin (INVOKANA) 300 MG TABS Take 1 tablet by mouth daily.      . colesevelam (WELCHOL) 625 MG tablet Take 1,875 mg by mouth 2 (two) times daily with a meal.      . cycloSPORINE (RESTASIS) 0.05 % ophthalmic emulsion Place 1 drop into both eyes 2 (two) times daily.      Marland Kitchen ezetimibe (ZETIA) 10 MG tablet Take 10 mg by mouth daily.      . ferrous sulfate 325 (65 FE) MG tablet Take 325 mg by mouth daily with breakfast.      . fluticasone (VERAMYST) 27.5 MCG/SPRAY nasal spray Place 2 sprays into the nose daily.      . furosemide (LASIX) 80 MG tablet Take 80 mg by mouth daily.      Marland Kitchen gabapentin (NEURONTIN) 600 MG tablet Take 600 mg by mouth at bedtime.      Marland Kitchen glyBURIDE (DIABETA) 5 MG tablet Take 5 mg by mouth daily with supper.      Marland Kitchen LEVEMIR FLEXTOUCH 100 UNIT/ML Pen Inject 30 Units into the skin every evening. 10 units qam and 30 units qpm.      . levothyroxine (SYNTHROID, LEVOTHROID) 75 MCG tablet Take 75 mcg by mouth daily.      Marland Kitchen lidocaine (LIDODERM) 5 % Place 1 patch onto the skin every 12 (twelve) hours. Remove & Discard patch within 12 hours or as directed by MD      . meloxicam (MOBIC) 15 MG tablet Take 15 mg by  mouth daily.      . metFORMIN (GLUCOPHAGE) 500 MG tablet Take 500 mg by mouth 2 (two) times daily with a meal.      . methylphenidate (CONCERTA) 36 MG CR tablet Take 36 mg by mouth every morning.      . nebivolol (BYSTOLIC) 5 MG tablet Take 5 mg by mouth daily.      . pantoprazole (PROTONIX) 40 MG tablet Take 40 mg by mouth daily.      . potassium chloride SA (K-DUR,KLOR-CON) 20  MEQ tablet Take 40 mEq by mouth daily.       Marland Kitchen spironolactone (ALDACTONE) 50 MG tablet Take 50 mg by mouth daily.      . temazepam (RESTORIL) 30 MG capsule Take 30 mg by mouth at bedtime as needed for sleep.      . Vitamin D, Ergocalciferol, (DRISDOL) 50000 UNITS CAPS Take 50,000 Units by mouth 2 (two) times a week. Mondays and fridays        Results for orders placed during the hospital encounter of 05/08/14 (from the past 48 hour(s))  GLUCOSE, CAPILLARY     Status: Abnormal   Collection Time    05/09/14  1:12 PM      Result Value Ref Range   Glucose-Capillary 170 (*) 70 - 99 mg/dL   Comment 1 Documented in Chart     Comment 2 Notify RN    GLUCOSE, CAPILLARY     Status: Abnormal   Collection Time    05/09/14  6:09 PM      Result Value Ref Range   Glucose-Capillary 122 (*) 70 - 99 mg/dL  GLUCOSE, CAPILLARY     Status: Abnormal   Collection Time    05/09/14  9:38 PM      Result Value Ref Range   Glucose-Capillary 175 (*) 70 - 99 mg/dL   Comment 1 Notify RN    GLUCOSE, CAPILLARY     Status: Abnormal   Collection Time    05/10/14  6:35 AM      Result Value Ref Range   Glucose-Capillary 128 (*) 70 - 99 mg/dL  GLUCOSE, CAPILLARY     Status: Abnormal   Collection Time    05/10/14 11:33 AM      Result Value Ref Range   Glucose-Capillary 142 (*) 70 - 99 mg/dL  GLUCOSE, CAPILLARY     Status: Abnormal   Collection Time    05/10/14  3:59 PM      Result Value Ref Range   Glucose-Capillary 162 (*) 70 - 99 mg/dL   Comment 1 Documented in Chart     Comment 2 Notify RN    GLUCOSE, CAPILLARY     Status: Abnormal    Collection Time    05/10/14 10:04 PM      Result Value Ref Range   Glucose-Capillary 144 (*) 70 - 99 mg/dL   Comment 1 Documented in Chart     Comment 2 Notify RN    GLUCOSE, CAPILLARY     Status: Abnormal   Collection Time    05/11/14  6:33 AM      Result Value Ref Range   Glucose-Capillary 125 (*) 70 - 99 mg/dL   No results found. Carotid Dopplers: - The vertebral arteries appear patent with antegrade flow. - Findings consistent with 1-39 percent stenosis involving the right internal carotid artery and the left internal carotid artery.   ROS: General:no colds or fevers, no weight changes Skin:no rashes or ulcers HEENT:no blurred vision, no congestion CV:see HPI PUL:see HPI GI:no diarrhea constipation or melena, no indigestion GU:no hematuria, no dysuria MS:no joint pain, no claudication Neuro:no syncope, no lightheadedness- recent CVA with rt side weakness and phasia Endo:+ diabetes, + thyroid disease   Blood pressure 123/62, pulse 73, temperature 98 F (36.7 C), temperature source Oral, resp. rate 20, height 5\' 4"  (1.626 m), weight 228 lb 9.9 oz (103.7 kg), SpO2 98.00%. PE: General:Pleasant affect, NAD, does not speak due to CVA  Skin:Warm and dry, brisk capillary refill HEENT:normocephalic, sclera  clear, mucus membranes moist, rt glass eye Neck:supple, no JVD, no bruits, no adenopathy  Heart:S1S2 RRR without murmur, gallup, rub or click Lungs:clear without rales, rhonchi, or wheezes EZM:OQHU, non tender, + BS, do not palpate liver spleen or masses Ext:no lower ext edema, 2+ pedal pulses, 2+ radial pulses Neuro:alert -unable to speak but shakes her head yes when asked if understands,  MAE, strength L>R   follows commands, + rt facial drrop   Tele:  SR   Assessment/Plan Principal Problem:   CVA (cerebral infarction)- second CVA, no a fib seen on tele  Plan for LINQ  Post TEE, scheduled at 49 today   Active Problems:   History of CHF (congestive heart failure)-  Stable No change required today    Diabetes mellitus type 2, controlled   Hypothyroidism  Obesity Body mass index is 39.22 kg/(m^2). Weight loss is advised   [facility] R  Nurse Practitioner Certified [place] Pager [phone] or after 5pm or weekends call [phone] 05/11/2014, 8:30 AM  I have seen, examined the patient, and reviewed the above assessment and plan.  Changes to above are made where necessary.   The patient presents with cryptogenic stroke.  The patient has a TEE planned for this afternoon.  I spoke at length with the patient about monitoring for afib with an implantable loop recorder.  Risks, benefits, and alteratives to implantable loop recorder were discussed with the patient today.   At this time, the patient is very clear in their decision to proceed with implantable loop recorder.   Please call with questions.

## 2014-05-11 NOTE — Progress Notes (Signed)
Patient is in Cath lab for testing at this time.TELE on standby. Verified with CCMD.  Ave Filter, RN

## 2014-05-11 NOTE — Progress Notes (Signed)
Pt discharged, discharge instructions went over with pt and family, IV at right hand removed, catheter intact and w/o incident. Evening meds provided to pt/family, confirmed medication prescriptions information. Pt transported via wheelchair to POV.

## 2014-05-11 NOTE — Progress Notes (Signed)
SLP Cancellation Note  Patient Details Name: Abigail Johns MRN: [id number] DOB: 20-Oct-1950   Cancelled treatment:       Reason Eval/Treat Not Completed: Patient at procedure or test/unavailable   Kervin Bones, Katherene Ponto 05/11/2014, 2:02 PM

## 2014-05-11 NOTE — Discharge Summary (Signed)
Physician Discharge Summary  Abigail Johns YSA:[id number] DOB: Sep 06, 1950 DOA: 05/08/2014  PCP: Abigail Ringer, MD  Admit date: 05/08/2014 Discharge date: 05/11/2014  Time spent: 35 minutes  Recommendations for Outpatient Follow-up:  1. Patient was discharged on Keppra 500 mg PO BID given suspicion of seizures 2. Prior to discharge home health services were resumed   Discharge Diagnoses:  Principal Problem:   CVA (cerebral infarction) Active Problems:   Seizures   History of CHF (congestive heart failure)   Diabetes mellitus type 2, controlled   Hypothyroidism   Discharge Condition: Stable  Diet recommendation: Heart Healthy Diet  Filed Weights   05/08/14 2336  Weight: 103.7 kg (228 lb 9.9 oz)    History of present illness:  Abigail Johns is a 63 y.o. female with history of recent CVA last month and presently in rehabilitation was brought to the ER after patient's daughter found the patient was altered mental status and difficult to arouse. As per patient's daughter patient was doing fine when she left her in the morning 7:30 AM following which around 9 AM patient went to sleep. At around 1 PM patient's caregiver was trying to wake her up and was found that patient was difficult to arouse and patient was brought to the ER. CT head showed possible uptake in from with possible hemorrhage. On-call neurologist Dr. Leonel Johns was consulted and patient also had EEG to make sure patient was not in status. Eventually MRI of the brain was done which showed large left MCA stroke. Patient has been admitted for further management. As per the daughter patient has become more alert awake after 7 PM. At this time patient still has some fascia which patient states is from a previous stroke. Patient also has mild right-sided weakness. Patient has a daughter was complaining of left lower quadrant pain since last night. Patient did not have any nausea vomiting or diarrhea. Patient did not  have any fever chills or did not complain of any chest pain or shortness of breath.    Hospital Course:  63 y.o. female with history of CVA Sept 2015 who was in rehabilitation brought to the ER after patient's daughter found the patient w/ altered mental status, difficult to arouse. As per daughter patient was doing fine when she left her at 7:30 AM following which around 9 AM patient went to sleep. At around 1 PM patient's caregiver was trying to wake her up and found that patient was difficult to arouse. Patient was brought to the ER. CT head suggested a possible acute CVA. On-call Neurologist Dr. Leonel Johns was consulted and patient had EEG. Eventually MRI of the brain showed large left MCA stroke.  Acute extensive LEFT subcortical white matter infarction / L MCA CVA  MRA notes severe 90% L MCA M1 segment stenosis  Patient having a recent stroke on 09/15 at [facility], having residual right-sided hemiparesis. During this hospitalization she was not administer TPA secondary to being out of window Patient was seen and evaluated by neurology Transthoracic echocardiogram was unremarkable as neurology recommending transesophageal echocardiogram to assess for possible embolic source. This was performed on 05/11/2014 There is suspicion that encephalopathy could have been secondary to seizure activity. Neurology recommended starting Keppra 500 mg by mouth twice a day Home health services were resumed on discharge Hyperlipidemia  LDL 120 - resume tx as soon as oral intake improved - note allergy to Lipitor / Crestor  Discharged on home regimen of Zetia and WelChol LLQ abdom  pain  CT abdom w/o acute findings - possibly related to UTI - follow clinically  UTI  Patient treated with IV ceftriaxone during this hospitalization DM2  CBG reasonably controlled  Hypothyroid  Cont home synthroid dose  OSA  Noncompliant w/ CPAP  Hx of CHF - last EF unknown  TTE pending -  no clinical evidence of signif volume overload  Gout  Hepatic steatosis  Noted on CT abdom    Consultations:  Neurology  Discharge Exam: Filed Vitals:   05/11/14 1009  BP: 120/58  Pulse: 68  Temp: 97.8 F (36.6 C)  Resp: 17    General: No acute respiratory distress  Lungs: Clear to auscultation bilaterally without wheezes or crackles  Cardiovascular: Regular rate and rhythm without murmur gallop or rub normal S1 and S2  Abdomen: Tender to deep palpation in LLQ, nondistended, soft, bowel sounds positive, no rebound, no ascites, no appreciable mass  Extremities: No significant cyanosis, clubbing, or edema bilateral lower extremities  Neuro: alert and oriented - follows commands - 4/5 strenght B U and LE - lid droop on R    Discharge Instructions You were cared for by a hospitalist during your hospital stay. If you have any questions about your discharge medications or the care you received while you were in the hospital after you are discharged, you can call the unit and asked to speak with the hospitalist on call if the hospitalist that took care of you is not available. Once you are discharged, your primary care physician will handle any further medical issues. Please note that NO REFILLS for any discharge medications will be authorized once you are discharged, as it is imperative that you return to your primary care physician (or establish a relationship with a primary care physician if you do not have one) for your aftercare needs so that they can reassess your need for medications and monitor your lab values.  Discharge Instructions   (HEART FAILURE PATIENTS) Call MD:  Anytime you have any of the following symptoms: 1) 3 pound weight gain in 24 hours or 5 pounds in 1 week 2) shortness of breath, with or without a dry hacking cough 3) swelling in the hands, feet or stomach 4) if you have to sleep on extra pillows at night in order to breathe.    Complete by:  As directed      Call  MD for:  difficulty breathing, headache or visual disturbances    Complete by:  As directed      Call MD for:  extreme fatigue    Complete by:  As directed      Call MD for:  hives    Complete by:  As directed      Call MD for:  persistant dizziness or light-headedness    Complete by:  As directed      Call MD for:  persistant nausea and vomiting    Complete by:  As directed      Call MD for:  redness, tenderness, or signs of infection (pain, swelling, redness, odor or green/yellow discharge around incision site)    Complete by:  As directed      Call MD for:  severe uncontrolled pain    Complete by:  As directed      Call MD for:  temperature >100.4    Complete by:  As directed      Diet - low sodium heart healthy    Complete by:  As directed  Increase activity slowly    Complete by:  As directed           Current Discharge Medication List    START taking these medications   Details  aspirin 325 MG tablet Take 1 tablet (325 mg total) by mouth daily. Qty: 30 tablet, Refills: 0    levETIRAcetam (KEPPRA) 500 MG tablet Take 1 tablet (500 mg total) by mouth 2 (two) times daily. Qty: 60 tablet, Refills: 1    mupirocin cream (BACTROBAN) 2 % Apply topically 2 (two) times daily. Qty: 15 g, Refills: 0      CONTINUE these medications which have NOT CHANGED   Details  allopurinol (ZYLOPRIM) 300 MG tablet Take 400 mg by mouth daily.     buPROPion (WELLBUTRIN) 100 MG tablet Take 100 mg by mouth 3 (three) times daily.    Canagliflozin (INVOKANA) 300 MG TABS Take 1 tablet by mouth daily.    colesevelam (WELCHOL) 625 MG tablet Take 1,875 mg by mouth 2 (two) times daily with a meal.    cycloSPORINE (RESTASIS) 0.05 % ophthalmic emulsion Place 1 drop into both eyes 2 (two) times daily.    ezetimibe (ZETIA) 10 MG tablet Take 10 mg by mouth daily.    ferrous sulfate 325 (65 FE) MG tablet Take 325 mg by mouth daily with breakfast.    fluticasone (VERAMYST) 27.5 MCG/SPRAY nasal  spray Place 2 sprays into the nose daily.    furosemide (LASIX) 80 MG tablet Take 80 mg by mouth daily.    gabapentin (NEURONTIN) 600 MG tablet Take 600 mg by mouth at bedtime.    glyBURIDE (DIABETA) 5 MG tablet Take 5 mg by mouth daily with supper.    LEVEMIR FLEXTOUCH 100 UNIT/ML Pen Inject 30 Units into the skin every evening. 10 units qam and 30 units qpm.    levothyroxine (SYNTHROID, LEVOTHROID) 75 MCG tablet Take 75 mcg by mouth daily.    lidocaine (LIDODERM) 5 % Place 1 patch onto the skin every 12 (twelve) hours. Remove & Discard patch within 12 hours or as directed by MD    meloxicam (MOBIC) 15 MG tablet Take 15 mg by mouth daily.    metFORMIN (GLUCOPHAGE) 500 MG tablet Take 500 mg by mouth 2 (two) times daily with a meal.    methylphenidate (CONCERTA) 36 MG CR tablet Take 36 mg by mouth every morning.    nebivolol (BYSTOLIC) 5 MG tablet Take 5 mg by mouth daily.    pantoprazole (PROTONIX) 40 MG tablet Take 40 mg by mouth daily.    potassium chloride SA (K-DUR,KLOR-CON) 20 MEQ tablet Take 40 mEq by mouth daily.     spironolactone (ALDACTONE) 50 MG tablet Take 50 mg by mouth daily.    temazepam (RESTORIL) 30 MG capsule Take 30 mg by mouth at bedtime as needed for sleep.    Vitamin D, Ergocalciferol, (DRISDOL) 50000 UNITS CAPS Take 50,000 Units by mouth 2 (two) times a week. Mondays and fridays       Allergies  Allergen Reactions  . Lipitor [Atorvastatin Calcium] Other (See Comments)    Myocitis  . Crestor [Rosuvastatin]     myalgias   Follow-up Information   Follow up with Abigail Ringer, MD In 2 weeks.   Specialty:  Internal Medicine   Contact information:   [address] [place] [address] [phone]       Follow up with [facility] In 1 week.   Contact information:   [place]  [place] [address] [phone]      Follow up with CROITORU,MIHAI, MD In 1 week.   Specialty:  Cardiology   Contact information:   [address] [place] [address] [phone]        The results of significant diagnostics from this hospitalization (including imaging, microbiology, ancillary and laboratory) are listed below for reference.    Significant Diagnostic Studies: Ct Abdomen Pelvis Wo Contrast  05/09/2014   CLINICAL DATA:  Diffuse generalized abdominal pain. Initial encounter. 10/22/2013.  EXAM: CT ABDOMEN AND PELVIS WITHOUT CONTRAST  TECHNIQUE: Multidetector CT imaging of the abdomen and pelvis was performed following the standard protocol without IV contrast.  COMPARISON:  None.  FINDINGS: BODY WALL: Unremarkable.  LOWER CHEST: Multi focal coronary atherosclerosis. Bandlike opacity at the right base is similar to previous, consistent with scar.  ABDOMEN/PELVIS:  Liver: Patchy low-density in the liver consistent with steatosis, more extensive in the right lobe.  Biliary: No evidence of biliary obstruction or stone.  Pancreas: Unremarkable.  Spleen: Unremarkable.  Adrenals: Unremarkable.  Kidneys and ureters: Small, nonobstructive calculi in the upper poles and right lower pole. The largest is in the right lower pole at 5 mm. No hydronephrosis.  Bladder: Limited evaluation due to decompressed state.  Reproductive: Hysterectomy. No adnexal mass. Previously noted cyst has not recurred.  Bowel: No obstruction. Negative appendix. Mild distal colonic diverticulosis.  Retroperitoneum: No mass or adenopathy.  Peritoneum: No ascites or pneumoperitoneum.  Vascular: No acute abnormality.  OSSEOUS: No acute abnormalities.  IMPRESSION: 1. No acute intra-abdominal findings. 2. Nonobstructive bilateral nephrolithiasis. 3. Hepatic steatosis.   Electronically Signed   By: Jorje Guild M.D.   On: 05/09/2014 03:59   Dg Chest 2 View  05/09/2014   CLINICAL DATA:  Stroke.  EXAM: CHEST  2 VIEW  COMPARISON:  10/31/2013  FINDINGS: Low lung volumes with interstitial crowding and mild atelectasis. This is confirmed on recent  abdominal CT. No edema, effusion, or pneumothorax. Normal heart size and mediastinal contours. No gross pneumonia or edema.  IMPRESSION: Low lung volumes with bibasilar atelectasis.   Electronically Signed   By: Jorje Guild M.D.   On: 05/09/2014 06:32   Ct Head Wo Contrast  05/09/2014   CLINICAL DATA:  CVA.  Subsequent encounter  EXAM: CT HEAD WITHOUT CONTRAST  TECHNIQUE: Contiguous axial images were obtained from the base of the skull through the vertex without intravenous contrast.  COMPARISON:  05/08/2014; brain MRI - 05/08/2014  FINDINGS: Unchanged increase attenuation of multiple sulci within the anterior aspect of the left temporal lobe (images 11 and 13, series 2) and compatible with mineralization as was demonstrated on brain MRI performed day prior.  Scattered left-sided periventricular hypodensities are unchanged compatible with the area of involving subcortical white matter infarction demonstrated on prior MRI.  No CT evidence of new acute large territory infarct. No intraparenchymal or extra-axial mass or hemorrhage. Unchanged size and configuration of the ventricles and basilar cisterns. No midline shift. Intracranial atherosclerosis. Limited visualization the paranasal sinuses and mastoid air cells are normal. No air-fluid levels. Regional soft tissues are normal. No displaced calvarial fracture. 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  IMPRESSION: 1. Grossly unchanged ill-defined left-sided periventricular hypodensities compatible with the known acute infarct demonstrated on brain MRI performed day prior. No CT evidence of hemorrhagic conversion or new acute large territory infarct. 2. Increased attenuation of multiple sulci within the left temporal lobe compatible with mineralization as demonstrated on prior brain  MRI.   Electronically Signed   By: Sandi Mariscal M.D.   On: 05/09/2014 04:04   Ct Head Wo Contrast  05/08/2014   ADDENDUM REPORT: 05/08/2014 14:57  ADDENDUM:  Critical Value/emergent results were called by telephone at the time of interpretation on 05/08/2014 at 2:56 pm to Dr. Leonel Johns, neurology,who verbally acknowledged these results. It should be noted that the patient by report had a CVA approximately 1 month ago. It is possible that the apparent hemorrhage in the left temporal region actually represents calcification as a reactive response to prior infarct. It is possible that there is both calcification and hemorrhage in this area. Given this circumstance, MR could be most helpful for further assessment with respect to these differential considerations.   Electronically Signed   By: Lowella Grip M.D.   On: 05/08/2014 14:57   05/08/2014   CLINICAL DATA:  Patient with acute onset aphasia ; reported CVA with right-sided weakness 1 month prior  EXAM: CT HEAD WITHOUT CONTRAST  TECHNIQUE: Contiguous axial images were obtained from the base of the skull through the vertex without intravenous contrast.  COMPARISON:  None.  FINDINGS: There is mild diffuse atrophy. There is hemorrhage tracking along the periphery of the parenchyma of the superior left temporal lobe. This hemorrhage is not causing appreciable mass effect. There is no midline shift. There is no mass appreciable.  There is a small infarct in the anterior limb of the left internal capsule which may be acute. There is patchy small vessel disease in the centra semiovale bilaterally. Elsewhere gray-white compartments appear normal.  There is a prosthetic globe on the right. Bony calvarium appears intact. The mastoid air cells are clear.  IMPRESSION: There is hemorrhage in the parenchyma of the lateral left temporal lobe. This appearance raises question of amyloid angiography. There is a questionable acute small infarct in the anterior limb of the left internal capsule. There is atrophy with patchy periventricular small vessel disease. There is no subdural or epidural fluid. There is a prosthetic right  globe.  Electronically Signed: By: Lowella Grip M.D. On: 05/08/2014 14:53   Mr Virgel Paling Wo Contrast  05/08/2014   CLINICAL DATA:  63 year old female history of stroke 1 month ago who presents with decreased level of consciousness. Suspected LEFT hemisphere acute on chronic infarction. Stroke risk factors include hypertension, diabetes, and hypercholesterolemia.  EXAM: MRI HEAD WITHOUT CONTRAST  MRA HEAD WITHOUT CONTRAST  TECHNIQUE: Multiplanar, multiecho pulse sequences of the brain and surrounding structures were obtained without intravenous contrast. Angiographic images of the head were obtained using MRA technique without contrast.  COMPARISON:  CT head earlier today.  FINDINGS: MRI HEAD FINDINGS  Moderately extensive restricted diffusion affects much of the centrum semiovale on the LEFT, extending from the subcortical frontal white matter to the parietal region, involving basal ganglia and insular region as well.  No restricted diffusion or blood products in the region of the LEFT temporal cortex abnormality displayed on CT. There is brain substance loss in this region, T2 and FLAIR hyperintensity, without lobar hemorrhage, and the CT findings likely reflect mineralization related to a subacute insult. There is T1 shortening affecting the LEFT basal ganglia, particularly the caudate, and loss of brain substance, with asymmetric enlargement of the frontal horn LEFT lateral ventricle, consistent with a subacute to chronic insult.  Generalized atrophy. Chronic microvascular ischemic change. Flow voids are maintained. No midline abnormality. Prosthetic RIGHT globe. Trace mastoid effusions. No acute sinus disease.  MRA HEAD FINDINGS  The internal  carotid arteries are widely patent. The LEFT is slightly smaller. The basilar artery is widely patent with both vertebrals contributing, LEFT greater than RIGHT.  There is a severe stenosis of the M1 segment LEFT middle cerebral artery estimated 90% or greater. No  similar lesion on the RIGHT. No flow reducing lesion of the anterior or posterior cerebral arteries. No cerebellar branch occlusion. No intracranial aneurysm.  IMPRESSION: Moderately extensive acute LEFT subcortical white matter infarction.  Abnormal LEFT temporal lobe on CT consistent with mineralization of a subacute insult. No parenchymal hemorrhage is evident.  Severe 90% LEFT MCA M1 segment stenosis correlates with the observed pattern(s) of ischemia.   Electronically Signed   By: Rolla Flatten M.D.   On: 05/08/2014 20:38   Mr Brain Wo Contrast  05/08/2014   CLINICAL DATA:  63 year old female history of stroke 1 month ago who presents with decreased level of consciousness. Suspected LEFT hemisphere acute on chronic infarction. Stroke risk factors include hypertension, diabetes, and hypercholesterolemia.  EXAM: MRI HEAD WITHOUT CONTRAST  MRA HEAD WITHOUT CONTRAST  TECHNIQUE: Multiplanar, multiecho pulse sequences of the brain and surrounding structures were obtained without intravenous contrast. Angiographic images of the head were obtained using MRA technique without contrast.  COMPARISON:  CT head earlier today.  FINDINGS: MRI HEAD FINDINGS  Moderately extensive restricted diffusion affects much of the centrum semiovale on the LEFT, extending from the subcortical frontal white matter to the parietal region, involving basal ganglia and insular region as well.  No restricted diffusion or blood products in the region of the LEFT temporal cortex abnormality displayed on CT. There is brain substance loss in this region, T2 and FLAIR hyperintensity, without lobar hemorrhage, and the CT findings likely reflect mineralization related to a subacute insult. There is T1 shortening affecting the LEFT basal ganglia, particularly the caudate, and loss of brain substance, with asymmetric enlargement of the frontal horn LEFT lateral ventricle, consistent with a subacute to chronic insult.  Generalized atrophy. Chronic  microvascular ischemic change. Flow voids are maintained. No midline abnormality. Prosthetic RIGHT globe. Trace mastoid effusions. No acute sinus disease.  MRA HEAD FINDINGS  The internal carotid arteries are widely patent. The LEFT is slightly smaller. The basilar artery is widely patent with both vertebrals contributing, LEFT greater than RIGHT.  There is a severe stenosis of the M1 segment LEFT middle cerebral artery estimated 90% or greater. No similar lesion on the RIGHT. No flow reducing lesion of the anterior or posterior cerebral arteries. No cerebellar branch occlusion. No intracranial aneurysm.  IMPRESSION: Moderately extensive acute LEFT subcortical white matter infarction.  Abnormal LEFT temporal lobe on CT consistent with mineralization of a subacute insult. No parenchymal hemorrhage is evident.  Severe 90% LEFT MCA M1 segment stenosis correlates with the observed pattern(s) of ischemia.   Electronically Signed   By: Rolla Flatten M.D.   On: 05/08/2014 20:38    Microbiology: Recent Results (from the past 240 hour(s))  MRSA PCR SCREENING     Status: Abnormal   Collection Time    05/08/14 11:48 PM      Result Value Ref Range Status   MRSA by PCR POSITIVE (*) NEGATIVE Final   Comment:            The GeneXpert MRSA Assay (FDA     approved for NASAL specimens     only), is one component of a     comprehensive MRSA colonization     surveillance program. It is not  intended to diagnose MRSA     infection nor to guide or     monitor treatment for     MRSA infections.     RESULT CALLED TO, READ BACK BY AND VERIFIED WITH:     K.BISHOP,RN 0340 05/08/14 M.CAMPBELL     Labs: Basic Metabolic Panel:  Recent Labs Lab 05/08/14 1434 05/08/14 1439 05/09/14 0640  NA 139 138 141  K 4.3 4.0 4.2  CL 102 105 103  CO2 23  --  23  GLUCOSE 140* 139* 148*  BUN 14 14 16   CREATININE 0.75 0.90 0.81  CALCIUM 9.7  --  9.3   Liver Function Tests:  Recent Labs Lab 05/08/14 1434  05/09/14 0640  AST 24 22  ALT 32 33  ALKPHOS 109 103  BILITOT 0.4 0.4  PROT 7.4 6.9  ALBUMIN 3.7 3.4*   No results found for this basename: LIPASE, AMYLASE,  in the last 168 hours No results found for this basename: AMMONIA,  in the last 168 hours CBC:  Recent Labs Lab 05/08/14 1434 05/08/14 1439 05/09/14 0640  WBC 6.6  --  7.8  NEUTROABS 3.6  --  4.9  HGB 14.7 16.0* 14.5  HCT 43.5 47.0* 42.3  MCV 93.1  --  92.2  PLT 270  --  253   Cardiac Enzymes: No results found for this basename: CKTOTAL, CKMB, CKMBINDEX, TROPONINI,  in the last 168 hours BNP: BNP (last 3 results) No results found for this basename: PROBNP,  in the last 8760 hours CBG:  Recent Labs Lab 05/10/14 1133 05/10/14 1559 05/10/14 2204 05/11/14 0633 05/11/14 1149  GLUCAP 142* 162* 144* 125* 140*       Signed:  Keano Guggenheim  [facility] 05/11/2014, 1:08 PM

## 2014-05-11 NOTE — Progress Notes (Signed)
Writer spoke to patient's daughter earlier r/t her discharge order. Daughter verbalized that she will be in after work to pick patient up. Daughter had not yet seen. Report off to on coming nurse Merry Proud to call Tomi Bamberger at [phone] for education of loop recorder and so forth.   Ave Filter, RN

## 2014-05-11 NOTE — CV Procedure (Signed)
SURGEON:  Thompson Grayer, MD     PREPROCEDURE DIAGNOSIS:  Cryptogenic Stroke    POSTPROCEDURE DIAGNOSIS:  Cryptogenic Stroke     PROCEDURES:   1. Implantable loop recorder implantation    INTRODUCTION:  Abigail Johns is a 63 y.o. female with a history of unexplained stroke who presents today for implantable loop implantation.  The patient has had a cryptogenic stroke.  Despite an extensive workup by neurology, no reversible causes have been identified.  she has worn telemetry during which she did not have arrhythmias.  There is significant concern for possible atrial fibrillation as the cause for the patients stroke.  The patient therefore presents today for implantable loop implantation.      DESCRIPTION OF PROCEDURE:  Informed verbal consent was obtained.  I discussed prior to any sedation the risks of the procedure and the patient was clear in her desire to proceed.  Unfortunately, nursing did to record patient's signature and no family was available for this discussion after sedation for TEE.  I had a discussion with Cory Munch (cath lab director) and also with risk management.  They both felt that as the risks, benefits, and alternatives had been well discussed with the patient by me that it was in the patients best interest that we proceed with the procedure.  Though the patient had received sedation, she was alert for me and expressed desire to proceed. Mapping over the patient's chest was performed by the EP lab staff to identify the area where electrograms were most prominent for ILR recording.  This area was found to be the left parasternal region over the 3rd-4th intercostal space. The patients left chest was therefore prepped and draped in the usual sterile fashion by the EP lab staff. The skin overlying the left parasternal region was infiltrated with lidocaine for local analgesia.  A 0.5-cm incision was made over the left parasternal region over the 3rd intercostal space.  A  subcutaneous ILR pocket was fashioned using a combination of sharp and blunt dissection.  A Medtronic Reveal [place] model [id number] implantable loop recorder was then placed into the pocket  R waves were very prominent and measured 0.12mV. EBL<1 ml.  Steri- Strips and a sterile dressing were then applied.  There were no early apparent complications.     CONCLUSIONS:   1. Successful implantation of a Medtronic Reveal LINQ implantable loop recorder for cryptogenic stroke  2. No early apparent complications.

## 2014-05-11 NOTE — Progress Notes (Signed)
  Echocardiogram Echocardiogram Transesophageal has been performed.  Abigail Johns 05/11/2014, 3:37 PM

## 2014-05-11 NOTE — Progress Notes (Signed)
[facility] called to deliver DME/ rolling walker with seat; Mindi Slicker RN,BSN,MHA [phone]

## 2014-05-11 NOTE — Op Note (Signed)
INDICATIONS: stroke  PROCEDURE:   Informed consent was obtained prior to the procedure. The risks, benefits and alternatives for the procedure were discussed and the patient comprehended these risks.  Risks include, but are not limited to, cough, sore throat, vomiting, nausea, somnolence, esophageal and stomach trauma or perforation, bleeding, low blood pressure, aspiration, pneumonia, infection, trauma to the teeth and death.    After a procedural time-out, the oropharynx was anesthetized with 20% benzocaine spray. The patient was given 5 mg versed and 50 mcg fentanyl for moderate sedation.   The transesophageal probe was inserted in the esophagus and stomach without difficulty and multiple views were obtained.  The patient was kept under observation until the patient left the procedure room.  The patient left the procedure room in stable condition.   Agitated microbubble saline contrast was administered.  COMPLICATIONS:    There were no immediate complications.  FINDINGS:  Normal TEE. No cardiac source of embolism.  RECOMMENDATIONS:   Loop recorder implantation  Time Spent Directly with the Patient:  45 minutes   Abigail Johns 05/11/2014, 3:03 PM

## 2014-05-11 NOTE — Progress Notes (Signed)
Patient gave permission for CM to contact her daughter [name] [phone]). TCT Lauren, patient lives with her and is active with [facility] for HHRN/ PT/ OT/ nurses aide/ ST/ SW; Attending MD at discharge please order [facility] services for resumption of care; Aneta Mins [phone]

## 2014-05-11 NOTE — Progress Notes (Addendum)
STROKE TEAM PROGRESS NOTE   HISTORY Abigail Johns is a 63 y.o. female PMH of HTN, CHF, HLD, thyroid disease, right glass eye, OSA, and recent stroke in 9/15 at [facility] in [place], [place] that left her with residual right side hemiparesis. Pt had apparently been fully functional and independent before stroke last month. Pt was then transferred to a rehab center in [facility] and had been able to perform some simple ADLs but still had continued right sided hemiparesis before coming home for a couple of weeks but had normal speech and mentation. The daughter reports that her mother had been complaining of some vague abdominal pain for the previous 2 days that wasn't resolved with alka seltzer. The daughter left for work at 41AM and stated her mother was in her normal state of health but still complaining of some abdominal pain. Apparently when the pt sister and nurse came to care for the patient she was not responsive. The didn't note any urinary or stool incontinence or random movements. The patient had apparently taken all her medications that AM and she wasn't on any anticoagulation or ASA. EMS was then called by the pt's daughter where she continued to be non responsive, with right sided hemiparesis, and some rapid eye movement. Good clinical exam was difficult as pt altered to the point of inability to participate in exam for EMS. No frank seizure like activity seen. Code stroke was called. She was LKW at  [facility] 05/08/2014. Patient was not administered TPA secondary to out of window and stroke within past 90 days. She was admitted to step down for further evaluation and treatment.   SUBJECTIVE (INTERVAL HISTORY) No family present. The patient remains severely aphasic. TEE done and loop recorder placed. PT recommend home PT.  OBJECTIVE Temp:  [97.4 F (36.3 C)-98.7 F (37.1 C)] 98 F (36.7 C) (10/16 0453) Pulse Rate:  [73-87] 73 (10/16 0453) Cardiac Rhythm:  [-] Normal sinus  rhythm (10/15 2020) Resp:  [18-20] 20 (10/16 0453) BP: (121-138)/(53-62) 123/62 mmHg (10/16 0453) SpO2:  [96 %-98 %] 98 % (10/16 0453)   Recent Labs Lab 05/10/14 0635 05/10/14 1133 05/10/14 1559 05/10/14 2204 05/11/14 0633  GLUCAP 128* 142* 162* 144* 125*    Recent Labs Lab 05/08/14 1434 05/08/14 1439 05/09/14 0640  NA 139 138 141  K 4.3 4.0 4.2  CL 102 105 103  CO2 23  --  23  GLUCOSE 140* 139* 148*  BUN 14 14 16   CREATININE 0.75 0.90 0.81  CALCIUM 9.7  --  9.3    Recent Labs Lab 05/08/14 1434 05/09/14 0640  AST 24 22  ALT 32 33  ALKPHOS 109 103  BILITOT 0.4 0.4  PROT 7.4 6.9  ALBUMIN 3.7 3.4*    Recent Labs Lab 05/08/14 1434 05/08/14 1439 05/09/14 0640  WBC 6.6  --  7.8  NEUTROABS 3.6  --  4.9  HGB 14.7 16.0* 14.5  HCT 43.5 47.0* 42.3  MCV 93.1  --  92.2  PLT 270  --  253   No results found for this basename: CKTOTAL, CKMB, CKMBINDEX, TROPONINI,  in the last 168 hours  Recent Labs  05/08/14 1434  LABPROT 14.9  INR 1.17    Recent Labs  05/08/14 1722  COLORURINE YELLOW  LABSPEC 1.012  PHURINE 5.0  GLUCOSEU NEGATIVE  HGBUR NEGATIVE  BILIRUBINUR NEGATIVE  KETONESUR NEGATIVE  PROTEINUR NEGATIVE  UROBILINOGEN 0.2  NITRITE NEGATIVE  LEUKOCYTESUR MODERATE*  Component Value Date/Time   CHOL 200 05/09/2014 0640   TRIG 198* 05/09/2014 0640   HDL 40 05/09/2014 0640   CHOLHDL 5.0 05/09/2014 0640   VLDL 40 05/09/2014 0640   LDLCALC 120* 05/09/2014 0640   Lab Results  Component Value Date   HGBA1C 7.3* 05/09/2014      Component Value Date/Time   LABOPIA NONE DETECTED 05/08/2014 1722   COCAINSCRNUR NONE DETECTED 05/08/2014 1722   LABBENZ NONE DETECTED 05/08/2014 1722   AMPHETMU NONE DETECTED 05/08/2014 1722   THCU NONE DETECTED 05/08/2014 1722   LABBARB NONE DETECTED 05/08/2014 1722     Recent Labs Lab 05/08/14 1434  ETH <11    Ct Abdomen Pelvis Wo Contrast 05/09/2014    1. No acute intra-abdominal findings. 2.  Nonobstructive bilateral nephrolithiasis. 3. Hepatic steatosis.   Dg Chest 2 View 05/09/2014    Low lung volumes with bibasilar atelectasis.     Ct Head Wo Contrast 05/09/2014   1. Grossly unchanged ill-defined left-sided periventricular hypodensities compatible with the known acute infarct demonstrated on brain MRI performed day prior. No CT evidence of hemorrhagic conversion or new acute large territory infarct. 2. Increased attenuation of multiple sulci within the left temporal lobe compatible with mineralization as demonstrated on prior brain MRI.    05/08/2014   There is hemorrhage in the parenchyma of the lateral left temporal lobe. This appearance raises question of amyloid angiography. There is a questionable acute small infarct in the anterior limb of the left internal capsule. There is atrophy with patchy periventricular small vessel disease. There is no subdural or epidural fluid. 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    It should be noted that the patient by report had a CVA approximately 1 month ago. It is possible that the apparent hemorrhage in the left temporal region actually represents calcification as a reactive response to prior infarct. It is possible that there is both calcification and hemorrhage in this area. Given this circumstance, MR could be most helpful for further assessment with respect to these differential considerations.  Mr Brain Wo Contrast 05/08/2014   Moderately extensive acute LEFT subcortical white matter infarction.  Abnormal LEFT temporal lobe on CT consistent with mineralization of a subacute insult. No parenchymal hemorrhage is evident.    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     2D Echocardiogram  No cardiac source of emboli was indentified. EF 60-65%   Carotid Doppler  No evidence of hemodynamically significant internal carotid artery stenosis. Vertebral artery  flow is antegrade.   EEG - Clinical Interpretation: This EEG demonstrates evidence of a left frontotemporal cerebral dysfunction that is etiologically nonspecific. There was no seizure or seizure predisposition recorded on this study.   TEE 05/11/2014 Normal TEE. No cardiac source of embolism.   PHYSICAL EXAM  Temp:  [97.4 F (36.3 C)-98.7 F (37.1 C)] 98 F (36.7 C) (10/16 0453) Pulse Rate:  [73-87] 73 (10/16 0453) Resp:  [18-20] 20 (10/16 0453) BP: (121-138)/(53-62) 123/62 mmHg (10/16 0453) SpO2:  [96 %-98 %] 98 % (10/16 0453)  General - Well nourished, well developed, in no apparent distress.  Ophthalmologic - right prosthetic glass eye, left eye not able to see through.  Cardiovascular - Regular rate and rhythm with no murmur.  Mental Status -  Awake alert not able to answer orientation questions due to aphasia. Language impaired, able to have some words out but not  full sentences, follow simple commands but not complex commands, naming and repetition deficit.    Cranial Nerves II - XII - II - not blinking to threat on the left. Right eye prosthetic.  III, IV, VI - Extraocular movements intact. V - Facial sensation intact bilaterally. VII - left facial droop. VIII - Hearing & vestibular intact bilaterally. X - Palate elevates symmetrically. XI - Chin turning & shoulder shrug intact bilaterally. XII - Tongue protrusion intact.  Motor Strength - The patient's strength was 5-/5 right UE and LE, 5/5 left UE and LE and pronator drift was absent.  Bulk was normal and fasciculations were absent.   Motor Tone - Muscle tone was assessed at the neck and appendages and was normal.  Reflexes - The patient's reflexes were 1+ in all extremities and she had no pathological reflexes.  Sensory - Light touch, temperature/pinprick were assessed and were normal.    Coordination - The patient had normal movements in the hands with no ataxia or dysmetria.  Tremor was absent.  Gait and  Station - not tested.   ASSESSMENT/PLAN Abigail Johns is a 63 y.o. female with history of HTN, CHF, HLD, thyroid disease, right glass eye, OSA, and recent L temporal stroke in 9/15 that left her with residual right side hemiparesis found unresponsive at home. She did not receive IV t-PA due to delay in arrival and stroke within past 90 days.    Acute left corona radiata stroke and subacute L temporal lobe infarct Sept 2015 with lamina necrosis - unclear etiology. The acute stroke maybe due to hypotension in the setting of high grade M1 stenosis. ? Additional workup needed. Have requested copy of records from [facility] in [place]. However, her presentation is also suspicious for seizure which is common after cortical stroke.    MRI  Acute left corona radiata stroke with subacute L temporal lobe infarct. Temporal area of stroke often associated with post stroke seizure.  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 Loop implanted.  no antithrombotics prior to admission, now on ASA 325mg  daily  Risk factor education  Ongoing aggressive risk factor management  Therapy recommendations:  Home PT   Disposition:  Home with daughter   ? Seizure  - EEG showed left frontotemporal slowing and no seizure - but it was done when pt almost back to her previous baseline. - continue keppra 500mg  bid  - According to [place] law, pt can not drive until seizure free for 6 months and under physician's care.  - Please maintain seizure precautions. Do not participate in activities where a loss of awareness could hurt you or someone else.  No swimming alone, no tub bathing, no hot tubs, no driving, no operating motorized vehicles(cars, ATVs, motorbikes, etc), lawnmowers or power tools.  No standing at heights, such as rooftops, ladders or stairs. For  children, no sliding boards, monkey bars or swings, or climbing trees.  Avoid hot objects such as stoves, heaters, open fires.  Wear a helmet when riding a bicycle, scooter, skateboard, etc. and avoid areas of traffic.  Set your water heater to 120 degrees or less.  Hypertension  Home meds: bystolic  Stable  Permissive hypertension for 24-48 hours - OK if < 220/120. Gradually normalize in 5-7 days.  Due to left M1 stenosis, BP  goal 120-150. Avoid hypotension.  Hyperlipidemia  Home meds:  zetia and welchol, intolerant to lipitor and crestor  LDL 120, not at goal of < 70  No statin d/t intolerance  Continue meds at discharge  Diabetes  HgbA1c 7.3 goal < 7.0  On metformin and glyburide at home  Uncontrolled but improving  DM education  Other Stroke Risk Factors Former Cigarette smoker, quit in 2003   Obesity, Body mass index is 39.22 kg/(m^2).    Coronary artery disease - MI Obstructive sleep apnea, noncompliant with CPAP at home  Other Active Problems  UTI  abd pain, ? R/t UTI  Thyroid disease  Chronic back pain  Other Pertinent History  Hx CHF  Gout  MRSA +  Hospital day # [place] PA-C [facility] Pager [phone] 05/11/2014, 9:40 AM   I, the attending vascular neurologist, have personally obtained a history, examined the patient, evaluated laboratory data, individually viewed imaging studies, and formulated the assessment and plan of care.  I have made any additions or clarifications directly to the above note and agree with the findings and plan as currently documented.   Rosalin Hawking, MD PhD Stroke Neurology 05/11/2014 6:04 PM    To contact Stroke Continuity provider, please refer to [email or web address] After hours, contact General Neurology

## 2014-05-11 NOTE — Progress Notes (Signed)
Patient returned from procedure (TEE) with discharge order. Daughter called. Left message to call staff back.   Ave Filter, RN

## 2014-05-11 NOTE — Interval H&P Note (Signed)
History and Physical Interval Note:  05/11/2014 12:55 PM  Abigail Johns  has presented today for surgery, with the diagnosis of stroke  The various methods of treatment have been discussed with the patient and family. After consideration of risks, benefits and other options for treatment, the patient has consented to  Procedure(s): TRANSESOPHAGEAL ECHOCARDIOGRAM (TEE) (N/A) as a surgical intervention .  The patient's history has been reviewed, patient examined, no change in status, stable for surgery.  I have reviewed the patient's chart and labs.  Questions were answered to the patient's satisfaction.     Bexley Mclester

## 2014-05-14 ENCOUNTER — Encounter (HOSPITAL_COMMUNITY): Payer: Self-pay | Admitting: Cardiovascular Disease

## 2014-05-18 NOTE — Progress Notes (Signed)
Late charge entry   05/18/14 0800  OT Time Calculation  OT Start Time 1146  OT Stop Time 1200  OT Time Calculation (min) 14 min  OT General Charges  $OT Visit 1 Procedure  OT Evaluation  $Initial OT Evaluation Tier I 1 Procedure  OT Treatments  $Self Care/Home Management  8-22 mins    Jeri Modena   OTR/L Pager: [phone] Office: [phone] .

## 2014-05-23 ENCOUNTER — Ambulatory Visit: Payer: BC Managed Care – PPO

## 2014-05-30 ENCOUNTER — Encounter: Payer: Self-pay | Admitting: Cardiovascular Disease

## 2014-05-30 ENCOUNTER — Encounter: Payer: Self-pay | Admitting: Internal Medicine

## 2014-05-30 ENCOUNTER — Ambulatory Visit (INDEPENDENT_AMBULATORY_CARE_PROVIDER_SITE_OTHER): Payer: BC Managed Care – PPO | Admitting: Cardiovascular Disease

## 2014-05-30 VITALS — BP 127/61 | HR 81 | Ht 65.0 in | Wt 231.2 lb

## 2014-05-30 DIAGNOSIS — G4733 Obstructive sleep apnea (adult) (pediatric): Secondary | ICD-10-CM

## 2014-05-30 DIAGNOSIS — Z8679 Personal history of other diseases of the circulatory system: Secondary | ICD-10-CM

## 2014-05-30 DIAGNOSIS — I6930 Unspecified sequelae of cerebral infarction: Secondary | ICD-10-CM

## 2014-05-30 DIAGNOSIS — Z9989 Dependence on other enabling machines and devices: Secondary | ICD-10-CM

## 2014-05-30 DIAGNOSIS — I10 Essential (primary) hypertension: Secondary | ICD-10-CM

## 2014-05-30 LAB — MDC_IDC_ENUM_SESS_TYPE_INCLINIC
Date Time Interrogation Session: 20151104151318
Zone Setting Detection Interval: 2000 ms
Zone Setting Detection Interval: 3000 ms
Zone Setting Detection Interval: 360 ms

## 2014-05-30 NOTE — Patient Instructions (Signed)
Your physician recommends that you schedule a follow-up appointment in: 12 months with Dr.Croitoru  

## 2014-05-30 NOTE — Progress Notes (Signed)
Patient ID: Abigail Johns, female   DOB: [birth year], 63 y.o.   MRN: [id number]     Reason for office visit F/U recent CVA, loop recorder Severe hyperlipidemia, obstructive sleep apnea, systemic hypertension  Abigail Johns had a large left MCA territory ischemic stroke in September and has residual right hemiparesis and expressive aphasia. She probably had a post stroke seizure last month. A loop recorder was implanted for AFib detection, since her carotids were free of stenoses and TEE was negative.  Her loop recorder has not identified atrial fibrillation or any tachy or bradyarrhythmia tat could have explained hr loss of consciousness (probably seizure).  She has long-standing problems with severe obesity, insulin resistant diabetes mellitus, severe hyperlipidemia intolerant to statins, systemic hypertension, diastolic HF, obstructive sleep apnea and chronic lower extremity edema. She is a former smoker. She has never undergone coronary angiography. She had a normal nuclear stress test in May of 2014. She does not have known peripheral atherosclerosis. We have tried to treat her severe hypercholesterolemia with a variety of agents and has been intolerant to all statins even to once weekly low-dose Crestor. She is currently taking a combination of WelChol and Zetia, but her LDL cholesterol remains severely elevated  Allergies  Allergen Reactions  . Lipitor [Atorvastatin Calcium] Other (See Comments)    Myocitis  . Crestor [Rosuvastatin]     myalgias    Current Outpatient Prescriptions  Medication Sig Dispense Refill  . allopurinol (ZYLOPRIM) 100 MG tablet   3  . aspirin 325 MG tablet Take 1 tablet (325 mg total) by mouth daily. 30 tablet 0  . buPROPion (WELLBUTRIN) 100 MG tablet Take 100 mg by mouth 3 (three) times daily.    . Canagliflozin (INVOKANA) 300 MG TABS Take 1 tablet by mouth daily.    . colesevelam (WELCHOL) 625 MG tablet Take 1,875 mg by mouth 2 (two) times daily with a  meal.    . cycloSPORINE (RESTASIS) 0.05 % ophthalmic emulsion Place 1 drop into both eyes 2 (two) times daily.    Marland Kitchen escitalopram (LEXAPRO) 5 MG tablet     . ezetimibe (ZETIA) 10 MG tablet Take 10 mg by mouth daily.    . ferrous sulfate 325 (65 FE) MG tablet Take 325 mg by mouth daily with breakfast.    . fluticasone (VERAMYST) 27.5 MCG/SPRAY nasal spray Place 2 sprays into the nose daily.    . furosemide (LASIX) 80 MG tablet Take 80 mg by mouth daily.    Marland Kitchen gabapentin (NEURONTIN) 600 MG tablet Take 600 mg by mouth at bedtime.    Marland Kitchen glyBURIDE (DIABETA) 5 MG tablet Take 5 mg by mouth daily with supper.    Marland Kitchen LEVEMIR FLEXTOUCH 100 UNIT/ML Pen Inject 30 Units into the skin every evening. 10 units qam and 30 units qpm.    . levETIRAcetam (KEPPRA) 500 MG tablet Take 1 tablet (500 mg total) by mouth 2 (two) times daily. 60 tablet 1  . levothyroxine (SYNTHROID, LEVOTHROID) 75 MCG tablet Take 75 mcg by mouth daily.    Marland Kitchen lidocaine (LIDODERM) 5 % Place 1 patch onto the skin every 12 (twelve) hours. Remove & Discard patch within 12 hours or as directed by MD    . meloxicam (MOBIC) 15 MG tablet Take 15 mg by mouth daily.    . metFORMIN (GLUCOPHAGE) 500 MG tablet Take 500 mg by mouth 2 (two) times daily with a meal.    . methylphenidate (CONCERTA) 36 MG CR tablet Take 36 mg by mouth every  morning.    . mupirocin cream (BACTROBAN) 2 % Apply topically 2 (two) times daily. 15 g 0  . nebivolol (BYSTOLIC) 5 MG tablet Take 5 mg by mouth daily.    Glory Rosebush VERIO test strip   6  . pantoprazole (PROTONIX) 40 MG tablet Take 40 mg by mouth daily.    . potassium chloride SA (K-DUR,KLOR-CON) 20 MEQ tablet Take 40 mEq by mouth daily.     Marland Kitchen spironolactone (ALDACTONE) 50 MG tablet Take 50 mg by mouth daily.    . temazepam (RESTORIL) 30 MG capsule Take 30 mg by mouth at bedtime as needed for sleep.    . Vitamin D, Ergocalciferol, (DRISDOL) 50000 UNITS CAPS Take 50,000 Units by mouth 2 (two) times a week. Mondays and fridays     . WELCHOL 3.75 G PACK      No current facility-administered medications for this visit.    Past Medical History  Diagnosis Date  . CHF (congestive heart failure)   . Hypertension   . High cholesterol   . Thyroid disease   . Diabetes mellitus   . Back pain, chronic   . Renal disorder   . Obesity   . OSA on CPAP   . Edema     Past Surgical History  Procedure Laterality Date  . Nephrolithotomy      Right  . Abdominal hysterectomy    . Eye surgery    . Bunionectomy      both feet  . Left knee surgery      Torn meniscus  . US echocardiography  12/24/2011    mild LVH,mild TR,trace MR,PI  . Laparoscopic ovarian cystectomy Right 11/01/2013    Procedure: LAPAROSCOPIC RIGHT OVARIAN CYSTECTOMY WITH COLLECTION OF WASHINGS ;  Surgeon: Ena Dawley, MD;  Location: [facility];  Service: Gynecology;  Laterality: Right;  . Laparoscopic lysis of adhesions N/A 11/01/2013    Procedure: LAPAROSCOPIC LYSIS OF ADHESIONS;  Surgeon: Ena Dawley, MD;  Location: [facility];  Service: Gynecology;  Laterality: N/A;  . Laparoscopy N/A 11/01/2013    Procedure: LAPAROSCOPY DIAGNOSTIC;  Surgeon: Ena Dawley, MD;  Location: [facility];  Service: Gynecology;  Laterality: N/A;  . Tee without cardioversion N/A 05/11/2014    Procedure: TRANSESOPHAGEAL ECHOCARDIOGRAM (TEE);  Surgeon: Sanda Klein, MD;  Location: [facility];  Service: Cardiovascular;  Laterality: N/A;    Family History  Problem Relation Age of Onset  . Diabetes Mother   . Hyperlipidemia Mother   . Hypertension Mother   . Hypertension Father   . Diabetes Father   . Cancer Father   . Heart attack Brother   . Hyperlipidemia Brother   . Hypertension Brother     History   Social History  . Marital Status: Divorced    Spouse Name: N/A    Number of Children: N/A  . Years of Education: N/A   Occupational History  . Not on file.   Social History Main Topics  . Smoking status: Former Smoker    Quit date: 07/26/2002  . Smokeless  tobacco: Never Used  . Alcohol Use: No  . Drug Use: No  . Sexual Activity: Not on file   Other Topics Concern  . Not on file   Social History Narrative    Review of systems: Improving strength in right arm and improving ability to speak. The patient specifically denies any chest pain at rest or with exertion, dyspnea at rest or with exertion, orthopnea, paroxysmal nocturnal dyspnea, syncope, palpitations, new focal neurological deficits,  intermittent claudication, lower extremity edema, unexplained weight gain, cough, hemoptysis or wheezing.  The patient also denies abdominal pain, nausea, vomiting, dysphagia, diarrhea, constipation, polyuria, polydipsia, dysuria, hematuria, frequency, urgency, abnormal bleeding or bruising, fever, chills, unexpected weight changes, mood swings, change in skin or hair texture, change in voice quality, auditory or visual problems, allergic reactions or rashes, new musculoskeletal complaints other than usual "aches and pains".   PHYSICAL EXAM BP 127/61 mmHg  Pulse 81  Ht 5\' 5"  (1.651 m)  Wt 231 lb 3.2 oz (104.872 kg)  BMI 38.47 kg/m2  General: Alert, oriented x3, no distress, orbit and obese Head: no evidence of trauma, PERRL, EOMI, no exophtalmos or lid lag, no myxedema, no xanthelasma; normal ears, nose and oropharynx Neck: normal jugular venous pulsations and no hepatojugular reflux; brisk carotid pulses without delay and no carotid bruits Chest: clear to auscultation, no signs of consolidation by percussion or palpation, normal fremitus, symmetrical and full respiratory excursions Cardiovascular: unable to locate the apical impulse, regular rhythm, normal first and second heart sounds, no murmurs, rubs or gallops Abdomen: no tenderness or distention, no masses by palpation, no abnormal pulsatility or arterial bruits, normal bowel sounds, no hepatosplenomegaly Extremities: no clubbing, cyanosis or edema; 2+ radial, ulnar and brachial pulses  bilaterally; 2+ right femoral, posterior tibial and dorsalis pedis pulses; 2+ left femoral, posterior tibial and dorsalis pedis pulses; no subclavian or femoral bruits Neuro: 4/5 right upper hemiparesis, symmetrical face, expressive aphasia.  Lipid Panel     Component Value Date/Time   CHOL 200 05/09/2014 0640   TRIG 198* 05/09/2014 0640   HDL 40 05/09/2014 0640   CHOLHDL 5.0 05/09/2014 0640   VLDL 40 05/09/2014 0640   LDLCALC 120* 05/09/2014 0640    BMET    Component Value Date/Time   NA 141 05/09/2014 0640   K 4.2 05/09/2014 0640   CL 103 05/09/2014 0640   CO2 23 05/09/2014 0640   GLUCOSE 148* 05/09/2014 0640   BUN 16 05/09/2014 0640   CREATININE 0.81 05/09/2014 0640   CALCIUM 9.3 05/09/2014 0640   GFRNONAA 77* 05/09/2014 0640   GFRAA 89* 05/09/2014 0640     ASSESSMENT AND PLAN  Loop recorder Good signal. No arrhythmia to date.  HTN (hypertension) Good control  Hypercholesteremia Despite welchol and zetia, LDL cholesterol remains high despite better control of diabetes mellitus. As she recovers, will discuss PCSK9 inhibitors, although these are not yet available for statin intolerant patients. She may now be a candidate for clinical trial since she has had a stroke. Continue the same medical therapy for now.   Morbid obesity Weight loss is important  History of CHF (congestive heart failure) Normal left ventricular systolic function by echo in October, there is no confirmation of the reported diagnosis of pulmonary hypertension (systolic PA pressure was 29 mm Hg on the echo from 2013 and October 2015). There is Doppler evidence of diastolic dysfunction, but no LVH or left atrial enlargement to support this.  OSA on CPAP Meds ordered this encounter  Medications  . allopurinol (ZYLOPRIM) 100 MG tablet    Sig:     Refill:  3  . WELCHOL 3.75 G PACK    Sig:   . escitalopram (LEXAPRO) 5 MG tablet    Sig:   . ONETOUCH VERIO test strip    Sig:     Refill:  [place] Lucion Dilger, MD, [facility] [phone] office [phone] pager

## 2014-06-11 ENCOUNTER — Ambulatory Visit (INDEPENDENT_AMBULATORY_CARE_PROVIDER_SITE_OTHER): Payer: BC Managed Care – PPO | Admitting: *Deleted

## 2014-06-11 DIAGNOSIS — I63412 Cerebral infarction due to embolism of left middle cerebral artery: Secondary | ICD-10-CM | POA: Diagnosis not present

## 2014-06-12 LAB — MDC_IDC_ENUM_SESS_TYPE_REMOTE

## 2014-06-13 NOTE — Progress Notes (Signed)
Loop recorder 

## 2014-06-19 ENCOUNTER — Telehealth: Payer: Self-pay | Admitting: Neurology

## 2014-06-19 NOTE — Telephone Encounter (Signed)
Pt called at 5:10PM and spoke with Aaron Edelman from [facility] and canceled her NP appt for 06/20/14. Pt stated that she has another appt. With a different provider. This was an ER referral.

## 2014-06-20 ENCOUNTER — Ambulatory Visit: Payer: BC Managed Care – PPO | Admitting: Neurology

## 2014-06-26 ENCOUNTER — Encounter: Payer: Self-pay | Admitting: Internal Medicine

## 2014-06-29 ENCOUNTER — Encounter: Payer: Self-pay | Admitting: Internal Medicine

## 2014-07-05 ENCOUNTER — Encounter (HOSPITAL_COMMUNITY): Payer: Self-pay | Admitting: Internal Medicine

## 2014-07-10 ENCOUNTER — Ambulatory Visit (INDEPENDENT_AMBULATORY_CARE_PROVIDER_SITE_OTHER): Payer: BC Managed Care – PPO | Admitting: *Deleted

## 2014-07-10 DIAGNOSIS — I63412 Cerebral infarction due to embolism of left middle cerebral artery: Secondary | ICD-10-CM

## 2014-07-12 ENCOUNTER — Ambulatory Visit (INDEPENDENT_AMBULATORY_CARE_PROVIDER_SITE_OTHER): Payer: BC Managed Care – PPO | Admitting: Neurology

## 2014-07-12 ENCOUNTER — Encounter: Payer: Self-pay | Admitting: Neurology

## 2014-07-12 VITALS — BP 127/73 | HR 84 | Ht 66.0 in | Wt 224.6 lb

## 2014-07-12 DIAGNOSIS — E7800 Pure hypercholesterolemia, unspecified: Secondary | ICD-10-CM

## 2014-07-12 DIAGNOSIS — I10 Essential (primary) hypertension: Secondary | ICD-10-CM

## 2014-07-12 DIAGNOSIS — R569 Unspecified convulsions: Secondary | ICD-10-CM | POA: Insufficient documentation

## 2014-07-12 DIAGNOSIS — Z8679 Personal history of other diseases of the circulatory system: Secondary | ICD-10-CM

## 2014-07-12 DIAGNOSIS — E119 Type 2 diabetes mellitus without complications: Secondary | ICD-10-CM

## 2014-07-12 DIAGNOSIS — I63412 Cerebral infarction due to embolism of left middle cerebral artery: Secondary | ICD-10-CM

## 2014-07-12 NOTE — Patient Instructions (Addendum)
-   check BP at home, avoid hypotension, BP goal 120-150  - check glucose at home and compliant with medication - Follow up with your primary care physician for stroke risk factor modification. Recommend maintain blood pressure goal <130/80, diabetes with hemoglobin A1c goal below 6.5% and lipids with LDL cholesterol goal below 70 mg/dL.  - introduce RESPECT ESUS to you for consideration - continue ASA for stroke prevention - continue keppra for now and will consider to taper off in 6 months if no seizure. - refer to speech/PT/OT  - follow up in 2 months.

## 2014-07-12 NOTE — Progress Notes (Signed)
Loop recorder 

## 2014-07-12 NOTE — Progress Notes (Signed)
STROKE NEUROLOGY FOLLOW UP NOTE  NAME: Abigail Johns DOB: August 20, 1950  REASON FOR VISIT: stroke follow up HISTORY FROM: chart and daughter  Today we had the pleasure of seeing Abigail Johns in follow-up at our Neurology Clinic. Pt was accompanied by daughter.   History Summary Abigail Johns is a 63 y.o. female PMH of HTN, DM, CHF, HLD intolerate to statin, thyroid disease, right glass eye, OSA, and recent stroke in 9/15 at [facility] in [place], [place] that left her with residual right side hemiparesis was admitted on 05/08/14 for unresponsiveness. There was no mentioning of any urinary or stool incontinence or shaking. EMS was then called by the pt's daughter where she continued to be non responsive, with right sided hemiparesis, and some rapid eye movement. No frank seizure like activity seen. Concerning for seizure episodes, EEG showed no seizure but left frontotemporal cerebral dysfunction consistent with previous stroke. She was put on keppra. MRI showed left temporal subacute stroke but also acute left coronal radiata and BG subcortical stroke. MRA showed left M1 severe stenosis. Her stroke considered emboli pattern but coronal radiata and BG stroke could also be due to hypotension in the setting of M1 high grade stenosis. TEE was done no cardiac source of emboli. Loop recorder was placed and pt sent to inpt rehab with full dose ASA as well as zetia.  Interval History During the interval time, the patient has been doing better. However, still weak on the left with sensory loss, with partial aphasia. Her BP in good control and today 127/73 in clinic. Pt stated that her glucose at home also good although did not give me numbers. Loop recording so far no afib episodes. Her therapies were stopped for some time due to insurance switch. Now she has coverage, will need to restart the therapy.  REVIEW OF SYSTEMS: Full 14 system review of systems performed and notable  only for those listed below and in HPI above, all others are negative:  Constitutional: N/A  Cardiovascular: N/A  Ear/Nose/Throat: N/A  Skin: N/A  Eyes: N/A  Respiratory: N/A  Gastroitestinal: N/A  Genitourinary: N/A Hematology/Lymphatic: N/A  Endocrine: N/A  Musculoskeletal: N/A  Allergy/Immunology: N/A  Neurological: N/A  Psychiatric: N/A  The following represents the patient's updated allergies and side effects list: Allergies  Allergen Reactions  . Lipitor [Atorvastatin Calcium] Other (See Comments)    Myocitis  . Crestor [Rosuvastatin]     myalgias    The neurologically relevant items on the patient's problem list were reviewed on today's visit.  Neurologic Examination  A problem focused neurological exam (12 or more points of the single system neurologic examination, vital signs counts as 1 point, cranial nerves count for 8 points) was performed.  Blood pressure 127/73, pulse 84, height 5\' 6"  (1.676 m), weight 224 lb 9.6 oz (101.878 kg).  General - Well nourished, well developed, in no apparent distress.  Ophthalmologic - right eye glass, left eye not able to see through.  Cardiovascular - Regular rate and rhythm with no murmur.  Mental Status -  Level of arousal and orientation to time, place, and person were intact. Partial expressive aphasia, with paraphasic errors, able to name objects but not parts of the objects, able to repeat but slow, consistent with transcortical motor aphasia.   Cranial Nerves II - XII - II - Visual field intact OU. III, IV, VI - Extraocular movements intact. V - Facial sensation decreased on the right. VII - mild right facial droop. VIII -  Hearing & vestibular intact bilaterally. X - Palate elevates symmetrically, mild to moderate dysarthria. XI - Chin turning & shoulder shrug intact bilaterally. XII - Tongue protrusion intact.  Motor Strength - The patient's strength 4/5 RUE proximal with pronator drift, 4+/5 RUE distally, RLE  4+/5. LUE and LLE 5/5.  Bulk was normal and fasciculations were absent.   Motor Tone - Muscle tone was assessed at the neck and appendages and was normal.  Reflexes - The patient's reflexes were 1+ in all extremities and she had no pathological reflexes.  Sensory - Light touch, temperature/pinprick were decreased on the right.    Coordination - The patient had normal movements in the hands and feet with no ataxia or dysmetria.  Tremor was absent.  Gait and Station - right mild to moderate hemiparetic gait.  Data reviewed: I personally reviewed the images and agree with the radiology interpretations.  Ct Abdomen Pelvis Wo Contrast 05/09/2014 1. No acute intra-abdominal findings. 2. Nonobstructive bilateral nephrolithiasis. 3. Hepatic steatosis.   Dg Chest 2 View 05/09/2014 Low lung volumes with bibasilar atelectasis.   Ct Head Wo Contrast 05/09/2014 1. Grossly unchanged ill-defined left-sided periventricular hypodensities compatible with the known acute infarct demonstrated on brain MRI performed day prior. No CT evidence of hemorrhagic conversion or new acute large territory infarct. 2. Increased attenuation of multiple sulci within the left temporal lobe compatible with mineralization as demonstrated on prior brain MRI.  05/08/2014 There is hemorrhage in the parenchyma of the lateral left temporal lobe. This appearance raises question of amyloid angiography. There is a questionable acute small infarct in the anterior limb of the left internal capsule. There is atrophy with patchy periventricular small vessel disease. There is no subdural or epidural fluid. 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  It should be noted that the patient by report had a CVA approximately 1 month ago. It is possible that the apparent hemorrhage in the left temporal region actually represents calcification as a reactive response to prior infarct. It is possible that there is both calcification and  hemorrhage in this area. Given this circumstance, MR could be most helpful for further assessment with respect to these differential considerations.  Mr Brain Wo Contrast 05/08/2014 Moderately extensive acute LEFT subcortical white matter infarction. Abnormal LEFT temporal lobe on CT consistent with mineralization of a subacute insult. No parenchymal hemorrhage is evident.   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   2D Echocardiogram No cardiac source of emboli was indentified. EF 60-65%   Carotid Doppler No evidence of hemodynamically significant internal carotid artery stenosis. Vertebral artery flow is antegrade.   EEG - Clinical Interpretation: This EEG demonstrates evidence of a left frontotemporal cerebral dysfunction that is etiologically nonspecific. There was no seizure or seizure predisposition recorded on this study.   TEE 05/11/2014 Normal TEE. No cardiac source of embolism.  Loop recording - no afib so far  Component     Latest Ref Rng 05/09/2014  Cholesterol     0 - 200 mg/dL 200  Triglycerides     <150 mg/dL 198 (H)  HDL     >39 mg/dL 40  Total CHOL/HDL Ratio      5.0  VLDL     0 - 40 mg/dL 40  LDL (calc)     0 - 99 mg/dL 120 (H)  Hgb A1c MFr Bld     <5.7 % 7.3 (H)  Mean Plasma Glucose     <  117 mg/dL 163 (H)  TSH     0.350 - 4.500 uIU/mL 3.160    Assessment: As you may recall, she is a 63 y.o. African American female with PMH of HTN, DM, CHF, HLD intolerate to statin, thyroid disease, right glass eye, OSA, and recent stroke in 9/15 at [facility] in [place], [place] that left her with residual right side hemiparesis was admitted on 05/08/14 for unresponsiveness. Concerning for seizure episodes, EEG showed no seizure but left frontotemporal cerebral dysfunction consistent with previous stroke. She was put on keppra. MRI showed left temporal subacute  stroke but also acute left coronal radiata and BG subcortical stroke. MRA showed left M1 severe stenosis. Her stroke considered emboli pattern but coronal radiata and BG stroke could also be due to hypotension in the setting of M1 high grade stenosis. TEE was done no cardiac source of emboli. Loop recorder was placed and so far no Afib episodes.   Plan:  - continue ASA for stroke prevention for now - zetia for HLD - due to left M1 high grade stenosis, will keep BP at 120-150 - check glucose at home - Follow up with your primary care physician for stroke risk factor modification. Recommend maintain blood pressure goal <130/80, diabetes with hemoglobin A1c goal below 6.5% and lipids with LDL cholesterol goal below 70 mg/dL.  - consider RESPECT ESUS trial - continue keppra and may taper off in 6 months if no seizures - PT/OT/speech referral - RTC in 2 months.   Orders Placed This Encounter  Procedures  . [place] TCD WITHMONITORING    Standing Status: Future     Number of Occurrences:      Standing Expiration Date: 01/12/2015    Order Specific Question:  Reason for Exam (SYMPTOM  OR DIAGNOSIS REQUIRED)    Answer:  embolic stroke    Order Specific Question:  Preferred imaging location?    Answer:  Internal  . Ambulatory referral to Physical Therapy    Referral Priority:  Urgent    Referral Type:  Physical Medicine    Referral Reason:  Specialty Services Required    Requested Specialty:  Physical Therapy    Number of Visits Requested:  1  . Ambulatory referral to Occupational Therapy    Referral Priority:  Urgent    Referral Type:  Occupational Therapy    Referral Reason:  Specialty Services Required    Requested Specialty:  Occupational Therapy    Number of Visits Requested:  1  . Ambulatory referral to Speech Therapy    Referral Priority:  Urgent    Referral Type:  Speech Therapy    Referral Reason:  Specialty Services Required    Requested Specialty:  Speech Pathology    Number of  Visits Requested:  1    No orders of the defined types were placed in this encounter.    Patient Instructions  - check BP at home, avoid hypotension, BP goal 120-150  - check glucose at home and compliant with medication - Follow up with your primary care physician for stroke risk factor modification. Recommend maintain blood pressure goal <130/80, diabetes with hemoglobin A1c goal below 6.5% and lipids with LDL cholesterol goal below 70 mg/dL.  - introduce RESPECT ESUS to you for consideration - continue ASA for stroke prevention - continue keppra for now and will consider to taper off in 6 months if no seizure. - refer to speech/PT/OT  - follow up in 2 months.    Rosalin Hawking,  MD PhD [facility] [address], [place], [place] [address] [phone]

## 2014-07-12 NOTE — Progress Notes (Signed)
by careful review, pt probably is not a good candidate for RESPECT ESUS trial as she has left MCA M1 severe stenosis >50%.  Rosalin Hawking, MD PhD Stroke Neurology 07/12/2014 1:43 PM

## 2014-07-13 DIAGNOSIS — Z0289 Encounter for other administrative examinations: Secondary | ICD-10-CM

## 2014-08-02 LAB — MDC_IDC_ENUM_SESS_TYPE_REMOTE

## 2014-08-08 ENCOUNTER — Ambulatory Visit (INDEPENDENT_AMBULATORY_CARE_PROVIDER_SITE_OTHER): Payer: BLUE CROSS/BLUE SHIELD

## 2014-08-08 DIAGNOSIS — I63412 Cerebral infarction due to embolism of left middle cerebral artery: Secondary | ICD-10-CM

## 2014-08-09 ENCOUNTER — Encounter: Payer: Self-pay | Admitting: Neurology

## 2014-08-09 ENCOUNTER — Ambulatory Visit (INDEPENDENT_AMBULATORY_CARE_PROVIDER_SITE_OTHER): Payer: BLUE CROSS/BLUE SHIELD | Admitting: *Deleted

## 2014-08-09 ENCOUNTER — Ambulatory Visit: Payer: BLUE CROSS/BLUE SHIELD | Admitting: Physical Therapy

## 2014-08-09 ENCOUNTER — Ambulatory Visit: Payer: BLUE CROSS/BLUE SHIELD

## 2014-08-09 ENCOUNTER — Ambulatory Visit: Payer: BLUE CROSS/BLUE SHIELD | Attending: Neurology | Admitting: Occupational Therapy

## 2014-08-09 ENCOUNTER — Encounter: Payer: Self-pay | Admitting: Physical Therapy

## 2014-08-09 DIAGNOSIS — R269 Unspecified abnormalities of gait and mobility: Secondary | ICD-10-CM

## 2014-08-09 DIAGNOSIS — R4701 Aphasia: Secondary | ICD-10-CM

## 2014-08-09 DIAGNOSIS — IMO0002 Reserved for concepts with insufficient information to code with codable children: Secondary | ICD-10-CM

## 2014-08-09 DIAGNOSIS — R279 Unspecified lack of coordination: Secondary | ICD-10-CM | POA: Diagnosis not present

## 2014-08-09 DIAGNOSIS — I6980 Unspecified sequelae of other cerebrovascular disease: Secondary | ICD-10-CM | POA: Diagnosis not present

## 2014-08-09 DIAGNOSIS — M6281 Muscle weakness (generalized): Secondary | ICD-10-CM | POA: Diagnosis not present

## 2014-08-09 DIAGNOSIS — R6889 Other general symptoms and signs: Secondary | ICD-10-CM | POA: Insufficient documentation

## 2014-08-09 DIAGNOSIS — I69898 Other sequelae of other cerebrovascular disease: Secondary | ICD-10-CM | POA: Insufficient documentation

## 2014-08-09 DIAGNOSIS — R482 Apraxia: Secondary | ICD-10-CM

## 2014-08-09 DIAGNOSIS — I63412 Cerebral infarction due to embolism of left middle cerebral artery: Secondary | ICD-10-CM

## 2014-08-09 DIAGNOSIS — I69359 Hemiplegia and hemiparesis following cerebral infarction affecting unspecified side: Secondary | ICD-10-CM | POA: Diagnosis not present

## 2014-08-09 DIAGNOSIS — R531 Weakness: Secondary | ICD-10-CM | POA: Diagnosis not present

## 2014-08-09 LAB — MDC_IDC_ENUM_SESS_TYPE_REMOTE

## 2014-08-09 NOTE — Therapy (Signed)
[facility] [address] [place], [place], [address] Phone: [phone]   Fax:  [phone]  Occupational Therapy Evaluation  Patient Details  Name: Abigail Johns MRN: [id number] Date of Birth: 05/30/1951 Referring Provider:  Tivis Ringer, MD  Encounter Date: 08/09/2014      OT End of Session - 08/09/14 1632    Visit Number 1   Number of Visits 17   Date for OT Re-Evaluation 10/07/14   Authorization Type [employer]   OT Start Time 1450   OT Stop Time 1530   OT Time Calculation (min) 40 min   Activity Tolerance Patient tolerated treatment well      Past Medical History  Diagnosis Date  . CHF (congestive heart failure)   . Hypertension   . High cholesterol   . Thyroid disease   . Diabetes mellitus   . Back pain, chronic   . Renal disorder   . Obesity   . OSA on CPAP   . Edema     Past Surgical History  Procedure Laterality Date  . Nephrolithotomy      Right  . Abdominal hysterectomy    . Eye surgery    . Bunionectomy      both feet  . Left knee surgery      Torn meniscus  . US echocardiography  12/24/2011    mild LVH,mild TR,trace MR,PI  . Laparoscopic ovarian cystectomy Right 11/01/2013    Procedure: LAPAROSCOPIC RIGHT OVARIAN CYSTECTOMY WITH COLLECTION OF WASHINGS ;  Surgeon: Ena Dawley, MD;  Location: [facility];  Service: Gynecology;  Laterality: Right;  . Laparoscopic lysis of adhesions N/A 11/01/2013    Procedure: LAPAROSCOPIC LYSIS OF ADHESIONS;  Surgeon: Ena Dawley, MD;  Location: [facility];  Service: Gynecology;  Laterality: N/A;  . Laparoscopy N/A 11/01/2013    Procedure: LAPAROSCOPY DIAGNOSTIC;  Surgeon: Ena Dawley, MD;  Location: [facility];  Service: Gynecology;  Laterality: N/A;  . Tee without cardioversion N/A 05/11/2014    Procedure: TRANSESOPHAGEAL ECHOCARDIOGRAM (TEE);  Surgeon: Sanda Klein, MD;  Location: [facility];  Service: Cardiovascular;  Laterality: N/A;  . Loop  recorder implant N/A 05/11/2014    Procedure: LOOP RECORDER IMPLANT;  Surgeon: Coralyn Mark, MD;  Location: [facility];  Service: Cardiovascular;  Laterality: N/A;    There were no vitals taken for this visit.  Visit Diagnosis:  Hemiparesis affecting dominant side as late effect of cerebrovascular accident - Plan: Ot plan of care cert/re-cert  Lack of coordination due to stroke - Plan: Ot plan of care cert/re-cert  Generalized muscle weakness - Plan: Ot plan of care cert/re-cert      Subjective Assessment - 08/09/14 1451    Pertinent History CVA 03/2014, another mild CVA 04/2014 but all impairments/deficits from 1st CVA   Patient Stated Goals Get back to work   Currently in Pain? 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 Rt hemiparesis (dominant side)   Onset Date 03/30/14   Prior Therapy HHOT and HHPT   Precautions   Precautions Fall  and NO Driving   Balance Screen   Has the patient fallen in the past 6 months Yes   How many times? twice in rehab   Home  Environment   Family/patient expects to be discharged to: Private residence   Home Layout Two level  lives on  1st floor   Bathroom Building control surveyor;Door  w/ built in Therapist, nutritional -quad   Lives With Daughter  and 108 y.o. 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    IADL   Prior Level of Function Shopping independent   Shopping Needs to be accompanied on any shopping trip   Prior  Level of Function Light Housekeeping independent   Light Housekeeping Performs light daily tasks such as dishwashing, bed making   Meal Prep Able to complete simple warm meal prep;Able to complete simple cold meal and snack prep  and light stovetop cooking   Community Mobility --  not driving at this time   Medication Management Takes responsibility if medication is prepared in advance in seperate dosage  in pill box   Financial Management Dependent  due to aphasia (daughter performing)   Written Expression   Dominant Hand Right   Handwriting 90% legible  writing name w/ extra time. Una   Vision - History   Baseline Vision --  prosthetic Rt eye   Cognition   Overall Cognitive Status Impaired/Different from baseline   Area of Impairment Following commands;Problem solving;Safety/judgement  due to aphasia   Following Commands Follows one step commands consistently  follows 2 step commands w/ demo   Sensation   Light Touch Impaired by gross assessment  inconsistent w/ stimulus and localization   Coordination   Gross Motor Movements are Fluid and Coordinated No   Fine Motor Movements are Fluid and Coordinated No   9 Hole Peg Test Rt = 36.60 sec. Lt = 28.50 sec.    Box and Blocks Rt = 31 (Lt = 48)   Right 9 Hole Peg Test 36.60 sec   Left 9 Hole Peg Test 28.50 sec   AROM   Overall AROM Comments RUE shoulder approx. 75%, elbow distally WFL's   Strength   Overall Strength Comments Grip strength: Rt = 40 lbs, Lt = 53 lbs                         OT Short Term Goals - 08/09/14 1640    OT SHORT TERM GOAL #1   Title Independent w/ HEP for coordination and Rt shoulder (due 09/08/14)   Time 4   Period Weeks   Status New   OT SHORT TERM GOAL #2   Title Improve RUE functional use as evidenced by performing 38 blocks or greater on Box & Blocks test    Baseline eval = 31   Time 4   Period Weeks   Status New   OT SHORT TERM GOAL #3   Title Improve coordination as  evidenced by performing 9 hole peg test in 30 sec. or less Rt hand   Baseline eval = 36.60 sec.   Time 4   Period Weeks   Status New   OT SHORT TERM GOAL #4   Title Pt/family to verbalize understanding w/ CVA warning signs and risk factors   Time 4   Period Weeks   Status  New   OT SHORT TERM GOAL #5   Title Pt to perform functional reaching at 115 degrees sh. flexion to retrieve and replace light objects from shelf consistently   Baseline 75% sh. flex (105-110*)   Time 4   Period Weeks   Status New           OT Long Term Goals - 08/09/14 1649    OT LONG TERM GOAL #1   Title Independent w/ updated strengthening HEP (DUE 10/07/14)   Time 8   Period Weeks   Status New   OT LONG TERM GOAL #2   Title Improve Rt hand strength to 48 lbs or greater for gripping/lifting tasks   Baseline eval = 40 lbs   Time 8   Period Weeks   Status New   OT LONG TERM GOAL #3   Title Pt to return to full IADLS    Baseline eval = only performing simple IADLS   Time 8   Period Weeks   Status New   OT LONG TERM GOAL #4   Title Pt to perform financial management tasks at 90% accuracy w/ extra time prn    Baseline eval = dependent d/t aphasia   Time 8   Period Weeks   Status New               Plan - 08/09/14 1634    Clinical Impression Statement Pt is a 64 y.o. female who presents to outpatient rehab s/p Lt CVA in Sept 2015. Pt had another episode in Oct 2015 - MRI showed another CVA. Pt presents w/ Rt dominant side hemiparesis and aphasia. Pt would benefit from O.T. to address RUE neuro re-education, coordination, and strength.    Rehab Potential Good   OT Frequency 2x / week   OT Duration 8 weeks  plus evaluation   OT Treatment/Interventions Self-care/ADL training;Therapeutic exercise;Neuromuscular education;Visual/perceptual remediation/compensation;Patient/family education;Cognitive remediation/compensation;Splinting;Moist Heat;Fluidtherapy;Energy conservation;Functional Mobility  Training;DME and/or AE instruction;Manual Therapy;Passive range of motion;Therapeutic activities   Plan CVA education, HEP for Rt shoulder and coordination   Consulted and Agree with Plan of Care Patient;Family member/caregiver   Family Member Consulted daughter        Problem List Patient Active Problem List   Diagnosis Date Noted  . Seizure 07/12/2014  . History of CVA with residual deficit 05/30/2014  . Seizures 05/11/2014  . CVA (cerebral infarction) 05/08/2014  . Diabetes mellitus type 2, controlled 05/08/2014  . Hypothyroidism 05/08/2014  . Hypercholesteremia 11/29/2013  . Status post laparoscopy 11/01/2013  . OSA on CPAP 10/31/2013  . HTN (hypertension) 10/31/2013  . Morbid obesity 10/31/2013  . Leg edema 10/31/2013  . Ovarian cyst 10/31/2013  . Preoperative cardiovascular examination 10/31/2013  . Pelvic mass 10/31/2013  . DM type 2 (diabetes mellitus, type 2) 10/31/2013  . History of CHF (congestive heart failure) 10/31/2013  . Acute renal insufficiency 10/31/2013  . Unspecified hypothyroidism 10/31/2013    Carey Bullocks, OTR/L 08/09/2014, 4:59 PM  [facility] [address] [place], [place], [address] Phone: [phone]   Fax:  [phone]

## 2014-08-09 NOTE — Patient Instructions (Signed)
  Please complete the assigned speech therapy homework each day - approx 20 min twice daily

## 2014-08-09 NOTE — Therapy (Signed)
[facility] [address] [place], [place], [address] Phone: [phone]   Fax:  [phone]  Physical Therapy Evaluation  Patient Details  Name: Abigail Johns MRN: [id number] Date of Birth: 12-09-1950 Referring Provider:  Tivis Ringer, MD  Encounter Date: 08/09/2014      PT End of Session - 08/09/14 1705    Visit Number 1   Number of Visits 9   PT Start Time 1400   PT Stop Time 1445   PT Time Calculation (min) 45 min   Equipment Utilized During Treatment Gait belt   Activity Tolerance Patient tolerated treatment well   Behavior During Therapy [facility] for tasks assessed/performed      Past Medical History  Diagnosis Date  . CHF (congestive heart failure)   . Hypertension   . High cholesterol   . Thyroid disease   . Diabetes mellitus   . Back pain, chronic   . Renal disorder   . Obesity   . OSA on CPAP   . Edema     Past Surgical History  Procedure Laterality Date  . Nephrolithotomy      Right  . Abdominal hysterectomy    . Eye surgery    . Bunionectomy      both feet  . Left knee surgery      Torn meniscus  . US echocardiography  12/24/2011    mild LVH,mild TR,trace MR,PI  . Laparoscopic ovarian cystectomy Right 11/01/2013    Procedure: LAPAROSCOPIC RIGHT OVARIAN CYSTECTOMY WITH COLLECTION OF WASHINGS ;  Surgeon: Ena Dawley, MD;  Location: [facility];  Service: Gynecology;  Laterality: Right;  . Laparoscopic lysis of adhesions N/A 11/01/2013    Procedure: LAPAROSCOPIC LYSIS OF ADHESIONS;  Surgeon: Ena Dawley, MD;  Location: [facility];  Service: Gynecology;  Laterality: N/A;  . Laparoscopy N/A 11/01/2013    Procedure: LAPAROSCOPY DIAGNOSTIC;  Surgeon: Ena Dawley, MD;  Location: [facility];  Service: Gynecology;  Laterality: N/A;  . Tee without cardioversion N/A 05/11/2014    Procedure: TRANSESOPHAGEAL ECHOCARDIOGRAM (TEE);  Surgeon: Sanda Klein, MD;  Location: [facility];  Service:  Cardiovascular;  Laterality: N/A;  . Loop recorder implant N/A 05/11/2014    Procedure: LOOP RECORDER IMPLANT;  Surgeon: Coralyn Mark, MD;  Location: [facility];  Service: Cardiovascular;  Laterality: N/A;    There were no vitals taken for this visit.  Visit Diagnosis:  Abnormality of gait - Plan: PT plan of care cert/re-cert  Activity intolerance - Plan: PT plan of care cert/re-cert  Weakness due to cerebrovascular accident - Plan: PT plan of care cert/re-cert      Subjective Assessment - 08/09/14 1410    Symptoms This 64yo female had CVA 03/30/14 with right side weakness and speech changes. She was on vacation at [facility] where she was hospitalized ~1 week and transported to [facility] thru 04/27/14.  HHPT, HHOT  & Speech until Nov. 2015. She saw neurologist for follow-up and Dr. Erlinda Hong recommended outpatient Neurorehab.    Patient Stated Goals Walking better in community.   Currently in Pain? No/denies          [facility] PT Assessment - 08/09/14 1400    Assessment   Medical Diagnosis CVA   Onset Date 03/30/14   Prior Therapy Rehab & HH   Precautions   Precautions Fall  No driving   Restrictions   Weight Bearing Restrictions No   Balance Screen   Has the patient fallen in the past  6 months Yes   How many times? 3  2 in rehab & 1 at home   Has the patient had a decrease in activity level because of a fear of falling?  No   Is the patient reluctant to leave their home because of a fear of falling?  No   Home Environment   Living Enviornment Private residence   Living Arrangements Children;Other relatives  7yo mother, 50yo dtr & 9yo granddaughter   Type of [place] --  2-4" threshold   Home Layout Two level;Full bath on main level  her bedroom was upstairs now downstairs.   [place] - quad;Shower seat - built in;Hospital bed   Prior Function   Level of Independence Independent with basic ADLs;Independent with homemaking with  ambulation;Independent with gait;Independent with transfers   Ambulation/Gait   Ambulation/Gait Yes   Ambulation/Gait Assistance 5: Supervision   Ambulation Distance (Feet) 150 Feet   Assistive device Large base quad cane;Straight cane  straight cane with tripod tip   Gait Pattern Step-through pattern;Decreased step length - left;Decreased stance time - right;Decreased stride length;Decreased hip/knee flexion - right;Decreased dorsiflexion - right;Decreased weight shift to right;Right hip hike;Right foot flat;Trunk rotated posteriorly on right;Poor foot clearance - right   Ambulation Surface Level;Indoor   Gait velocity 0.87 ft/sec with LBQC, 0.93 ft/sec   Berg Balance Test   Sit to Stand Able to stand  independently using hands   Standing Unsupported Able to stand 2 minutes with supervision   Sitting with Back Unsupported but Feet Supported on Floor or Stool Able to sit safely and securely 2 minutes   Stand to Sit Controls descent by using hands   Transfers Able to transfer safely, minor use of hands   Standing Unsupported with Eyes Closed Able to stand 10 seconds with supervision   Standing Ubsupported with Feet Together Able to place feet together independently and stand for 1 minute with supervision   From Standing, Reach Forward with Outstretched Arm Can reach forward >12 cm safely (5")   From Standing Position, Pick up Object from Floor Able to pick up shoe, needs supervision   From Standing Position, Turn to Look Behind Over each Shoulder Looks behind one side only/other side shows less weight shift   Turn 360 Degrees Needs close supervision or verbal cueing   Standing Unsupported, Alternately Place Feet on Step/Stool Able to complete >2 steps/needs minimal assist   Standing Unsupported, One Foot in Front Able to take small step independently and hold 30 seconds   Standing on One Leg Tries to lift leg/unable to hold 3 seconds but remains standing independently   Total Score 37    Timed Up and Go Test   Normal TUG (seconds) 29.33  29.33 LBQC, 29.89 Single point cane,                               PT Long Term Goals - 08/09/14 1716    PT LONG TERM GOAL #1   Title demonstrates HEP correctly. (Target Date: 09/07/14)   Time 4   Period Weeks   Status New   PT LONG TERM GOAL #2   Title Gait Velocity >1.0 ft/sec   Time 4   Period Weeks   Status New   PT LONG TERM GOAL #3   Title Berg Balance >/= 45/56 (Target Date: 09/07/14)   Time 4   Period Weeks   Status  New   PT LONG TERM GOAL #4   Title Timed Up & Go <21 seconds (Target Date: 09/07/14)   Time 4   Period Weeks   Status New   PT LONG TERM GOAL #5   Title ambulates 300' & negotiates ramp/curb with single point cane modified independent. (Target Date: 09/07/14)   Time 4   Period Weeks   Status New               Plan - 08/09/14 1707    Clinical Impression Statement This 64yo female had a CVA on 07/03/14 and recieved Inpatient rehab and homehealth initially. She had seizure like activity and 2nd CVA was ruled out in Dec. 2015. She was referred to [facility] for further progress by neurologist on follow-up. PT noted deficits with strength, gait including velocity, balance with Merrilee Jansky 37/56 and Timed Up /Go 29.33sec.    Pt will benefit from skilled therapeutic intervention in order to improve on the following deficits Abnormal gait;Decreased activity tolerance;Decreased balance;Decreased endurance;Decreased mobility;Decreased range of motion;Decreased strength   Rehab Potential Good   PT Frequency 2x / week   PT Duration 4 weeks   PT Treatment/Interventions ADLs/Self Care Home Management;DME Instruction;Gait training;Stair training;Functional mobility training;Therapeutic activities;Therapeutic exercise;Balance training;Neuromuscular re-education;Patient/family education   PT Next Visit Plan HEP   PT Home Exercise Plan balance /strength   Consulted and Agree with Plan of Care  Patient;Family member/caregiver   Family Member Consulted daughter         Problem List Patient Active Problem List   Diagnosis Date Noted  . Seizure 07/12/2014  . History of CVA with residual deficit 05/30/2014  . Seizures 05/11/2014  . CVA (cerebral infarction) 05/08/2014  . Diabetes mellitus type 2, controlled 05/08/2014  . Hypothyroidism 05/08/2014  . Hypercholesteremia 11/29/2013  . Status post laparoscopy 11/01/2013  . OSA on CPAP 10/31/2013  . HTN (hypertension) 10/31/2013  . Morbid obesity 10/31/2013  . Leg edema 10/31/2013  . Ovarian cyst 10/31/2013  . Preoperative cardiovascular examination 10/31/2013  . Pelvic mass 10/31/2013  . DM type 2 (diabetes mellitus, type 2) 10/31/2013  . History of CHF (congestive heart failure) 10/31/2013  . Acute renal insufficiency 10/31/2013  . Unspecified hypothyroidism 10/31/2013    Lovada Barwick PT, DPT 08/09/2014, 5:31 PM  [facility] [address] [place], [place], [address] Phone: [phone]   Fax:  [phone]

## 2014-08-10 NOTE — Therapy (Signed)
[facility] [address] [place], [place], [address] Phone: [phone]   Fax:  [phone]  Speech Language Pathology Evaluation  Patient Details  Name: Abigail Johns MRN: [id number] Date of Birth: 1950-10-25 Referring Provider:  Tivis Ringer, MD  Encounter Date: 08/09/2014      End of Session - 08/09/14 1635    Visit Number 1   Number of Visits 16   Date for SLP Re-Evaluation 10/08/14   SLP Start Time 1533   SLP Stop Time  1617   SLP Time Calculation (min) 44 min   Activity Tolerance Patient tolerated treatment well      Past Medical History  Diagnosis Date  . CHF (congestive heart failure)   . Hypertension   . High cholesterol   . Thyroid disease   . Diabetes mellitus   . Back pain, chronic   . Renal disorder   . Obesity   . OSA on CPAP   . Edema     Past Surgical History  Procedure Laterality Date  . Nephrolithotomy      Right  . Abdominal hysterectomy    . Eye surgery    . Bunionectomy      both feet  . Left knee surgery      Torn meniscus  . US echocardiography  12/24/2011    mild LVH,mild TR,trace MR,PI  . Laparoscopic ovarian cystectomy Right 11/01/2013    Procedure: LAPAROSCOPIC RIGHT OVARIAN CYSTECTOMY WITH COLLECTION OF WASHINGS ;  Surgeon: Ena Dawley, MD;  Location: [facility];  Service: Gynecology;  Laterality: Right;  . Laparoscopic lysis of adhesions N/A 11/01/2013    Procedure: LAPAROSCOPIC LYSIS OF ADHESIONS;  Surgeon: Ena Dawley, MD;  Location: [facility];  Service: Gynecology;  Laterality: N/A;  . Laparoscopy N/A 11/01/2013    Procedure: LAPAROSCOPY DIAGNOSTIC;  Surgeon: Ena Dawley, MD;  Location: [facility];  Service: Gynecology;  Laterality: N/A;  . Tee without cardioversion N/A 05/11/2014    Procedure: TRANSESOPHAGEAL ECHOCARDIOGRAM (TEE);  Surgeon: Sanda Klein, MD;  Location: [facility];  Service: Cardiovascular;  Laterality: N/A;  . Loop recorder implant N/A  05/11/2014    Procedure: LOOP RECORDER IMPLANT;  Surgeon: Coralyn Mark, MD;  Location: [facility];  Service: Cardiovascular;  Laterality: N/A;    There were no vitals taken for this visit.  Visit Diagnosis: Apraxia - Plan: SLP plan of care cert/re-cert  Aphasia - Plan: SLP plan of care cert/re-cert      Subjective Assessment - 08/09/14 1538    Symptoms "Cath-er-lene"   Currently in Pain? No/denies          SLP Evaluation [facility] - 08/09/14 1538    General Information   HPI Pt with CVA in [place]  2015 and 2nd in mid-October 2015. Rec'd approx 8 weeks HHST ending in [place]. (approx 6 weeks)   Prior Functional Status   Cognitive/Linguistic Baseline Within functional limits   Type of Home House    Lives With Daughter  mother, granddaughter   Vocation Full time employment   Auditory Comprehension   Yes/No Questions Impaired   Paragraph Comprehension (via yes/no questions) 76-100% accurate   Commands Within Functional Limits  1 and 2-step commands intact   Conversation --  simple is functional, rare cues still needed   EffectiveTechniques Extra processing time;Repetition   Verbal Expression   Overall Verbal Expression Impaired   Automatic Speech Name;Counting;Day of week  some automatic phrases e.g., "Can you say it again?"   Naming Impairment  Responsive 26-50% accurate   Confrontation 50-74% accurate   Verbal Errors Semantic paraphasias;Neologisms;Other (comment)  pt not consistently aware of expressive errors   Interfering Components --  apraxia   Effective Techniques Phonemic cues;Written cues;Articulatory cues   Written Expression   Dominant Hand Right   Written Expression Exceptions to [facility]   Self Formulation Ability --  family names WNL, address impaired             SLP Education - 08/09/14 0254    Education provided Yes   Education Details apraxia, aphasia definitions and examples   Person(s) Educated Patient;Child(ren)   Methods Explanation    Comprehension Verbalized understanding;Need further instruction          SLP Short Term Goals - 08/09/14 1637    SLP SHORT TERM GOAL #1   Title pt will complete simple naming with 85% success over three sessions (began 08-09-14)   Time 4   Period Weeks   Status New   SLP SHORT TERM GOAL #2   Title pt will complete functional communication with simple wants/needs via multimodal communication with 80% success and rare min A (began 08-09-14)   SLP [place] #3   Title demo understanding of simple conversation 90% of the time (began 08-09-14)   Time 4   Period Weeks   Status New   SLP SHORT TERM GOAL #4   Title demo understanding of 4-5 sentence spoken information by answering questions with 90% success and occasional min A (began 08-09-14)   Time 4   Period Weeks   Status New   SLP SHORT TERM GOAL #5   Title pt will complete HEP with occasional min A (began 08-09-14)   Time 4   Period Weeks   Status New          SLP Long Term Goals - 08/10/14 0909    SLP LONG TERM GOAL #1   Title pt will demo understanding of mod complex conversation with occasional min A (began 08-09-14)   Time 8   Period Weeks  or 16 visits)   Status New   SLP LONG TERM GOAL #2   Title pt will use multimodal communication PRN without cues (began 08-09-14)   Time 8   Period Weeks  or 16 visits   Status New   SLP LONG TERM GOAL #3   Title pt will complete any HEP with rare min A (began 08-09-14)   Time 8   Period Weeks  or 16 visits   Status New          Plan - 08/09/14 1635    Clinical Impression Statement Pt presents with mod expressive and mild-mod receptive aphasia (although difficult to guage based upon extent of expressive difficulty), complicated by moderate apraxia of speech deficit.   Speech Therapy Frequency 2x / week   Duration --  for 8 weeks, or for 16 visits whichever occurs first   Treatment/Interventions Language facilitation;Cueing hierarchy;Multimodal communcation  approach;Functional tasks;Patient/family education;Compensatory strategies;Internal/external aids;SLP instruction and feedback   Potential to Achieve Goals Good   Potential Considerations Severity of impairments        Problem List Patient Active Problem List   Diagnosis Date Noted  . Seizure 07/12/2014  . History of CVA with residual deficit 05/30/2014  . Seizures 05/11/2014  . CVA (cerebral infarction) 05/08/2014  . Diabetes mellitus type 2, controlled 05/08/2014  . Hypothyroidism 05/08/2014  . Hypercholesteremia 11/29/2013  . Status post laparoscopy 11/01/2013  . OSA on CPAP 10/31/2013  .  HTN (hypertension) 10/31/2013  . Morbid obesity 10/31/2013  . Leg edema 10/31/2013  . Ovarian cyst 10/31/2013  . Preoperative cardiovascular examination 10/31/2013  . Pelvic mass 10/31/2013  . DM type 2 (diabetes mellitus, type 2) 10/31/2013  . History of CHF (congestive heart failure) 10/31/2013  . Acute renal insufficiency 10/31/2013  . Unspecified hypothyroidism 10/31/2013    [facility], SLP 08/10/2014, 9:32 AM  [facility] [address] [place], [place], [address] Phone: [phone]   Fax:  [phone]

## 2014-08-10 NOTE — Progress Notes (Signed)
Loop recorder 

## 2014-08-14 ENCOUNTER — Encounter: Payer: Self-pay | Admitting: Cardiovascular Disease

## 2014-09-07 ENCOUNTER — Ambulatory Visit (INDEPENDENT_AMBULATORY_CARE_PROVIDER_SITE_OTHER): Payer: BLUE CROSS/BLUE SHIELD | Admitting: *Deleted

## 2014-09-07 ENCOUNTER — Telehealth: Payer: Self-pay | Admitting: *Deleted

## 2014-09-07 DIAGNOSIS — Z0289 Encounter for other administrative examinations: Secondary | ICD-10-CM

## 2014-09-07 DIAGNOSIS — I63412 Cerebral infarction due to embolism of left middle cerebral artery: Secondary | ICD-10-CM

## 2014-09-07 NOTE — Telephone Encounter (Signed)
Disability forms filled out and faxed (will never be able to return to work) to [employer] on 09/05/2014.

## 2014-09-11 ENCOUNTER — Ambulatory Visit: Payer: BLUE CROSS/BLUE SHIELD | Attending: Neurology | Admitting: Physical Therapy

## 2014-09-11 ENCOUNTER — Ambulatory Visit: Payer: BLUE CROSS/BLUE SHIELD

## 2014-09-11 ENCOUNTER — Encounter: Payer: Self-pay | Admitting: Physical Therapy

## 2014-09-11 DIAGNOSIS — R279 Unspecified lack of coordination: Secondary | ICD-10-CM | POA: Insufficient documentation

## 2014-09-11 DIAGNOSIS — R531 Weakness: Secondary | ICD-10-CM | POA: Insufficient documentation

## 2014-09-11 DIAGNOSIS — I6980 Unspecified sequelae of other cerebrovascular disease: Secondary | ICD-10-CM | POA: Insufficient documentation

## 2014-09-11 DIAGNOSIS — R4701 Aphasia: Secondary | ICD-10-CM | POA: Diagnosis not present

## 2014-09-11 DIAGNOSIS — R482 Apraxia: Secondary | ICD-10-CM

## 2014-09-11 DIAGNOSIS — I69359 Hemiplegia and hemiparesis following cerebral infarction affecting unspecified side: Secondary | ICD-10-CM | POA: Diagnosis not present

## 2014-09-11 DIAGNOSIS — I69898 Other sequelae of other cerebrovascular disease: Secondary | ICD-10-CM | POA: Diagnosis present

## 2014-09-11 DIAGNOSIS — M6281 Muscle weakness (generalized): Secondary | ICD-10-CM | POA: Insufficient documentation

## 2014-09-11 DIAGNOSIS — R6889 Other general symptoms and signs: Secondary | ICD-10-CM | POA: Diagnosis not present

## 2014-09-11 DIAGNOSIS — IMO0002 Reserved for concepts with insufficient information to code with codable children: Secondary | ICD-10-CM

## 2014-09-11 DIAGNOSIS — R269 Unspecified abnormalities of gait and mobility: Secondary | ICD-10-CM | POA: Diagnosis not present

## 2014-09-11 NOTE — Patient Instructions (Signed)
  Please complete the assigned speech therapy homework and return it to your next session. Work for 15 minutes at least x3/day on these tasks.

## 2014-09-11 NOTE — Patient Instructions (Signed)
Bridging   Slowly raise buttocks from floor, keeping stomach tight. Hold 5 seconds. Repeat _10___ times per set.  Do _1-2sessions per day.  http://orth.exer.us/1096   Copyright  VHI. All rights reserved.  Straight Leg Raise   Tighten stomach and slowly raise locked right leg _2-3___ inches from floor. Repeat __10__ times per set. Do __1__ sets per session. Do __1-2_ sessions per day.  http://orth.exer.us/1102   Copyright  VHI. All rights reserved.  Hip Flexor Stretch   Lying on back near edge of bed, with knees bent, lower right foot to floor and then back up to bed/sofa. Repeat _10___ times. Do _1-2___ sessions per day.   http://gt2.exer.us/347   Copyright  VHI. All rights reserved.  Functional Quadriceps: Sit to Stand   Sit on edge of chair, feet flat on floor. Stand upright, extending knees fully. Repeat __10__ times per set. Do __1__ sets per session. Do _1-2___ sessions per day.  http://orth.exer.us/735   Copyright  VHI. All rights reserved.  Heel Raises   Stand with support. With knees straight, raise heels off ground. Hold _5__ seconds. Repeat 10___ times. Do 1-2___ times a day.  Copyright  VHI. All rights reserved.  Hip (Side)   Stand with support. Alternate kicking leg out to side and returning in slowly. Repeat __10__ times each leg.  Do 1-2 sessions per day.   Copyright  VHI. All rights reserved.  HIP / KNEE: Extension - Standing   Hold onto kitchen counter for stability: Squeeze glutes. Raise and lift leg backward. Keep knee straight or slightly bent. Alternate legs. Do not lean forward _10__ reps per leg. 1-2 times a day.  Copyright  VHI. All rights reserved.  Single Leg - Eyes Open   Holding support, lift right leg while maintaining balance over other leg. Hold for 10 seconds. Repeat with other leg. Progress to removing hands from support surface for longer periods of time.  Repeat ___3_ times per leg. Do _1-2___ sessions per  day.  Copyright  VHI. All rights reserved.

## 2014-09-11 NOTE — Therapy (Signed)
[facility] [address] [place], [place], [address] Phone: [phone]   Fax:  [phone]  Speech Language Pathology Treatment  Patient Details  Name: Abigail Johns MRN: [id number] Date of Birth: [birth year] Referring Provider:  Tivis Ringer, MD  Encounter Date: 09/11/2014      End of Session - 09/11/14 1529    Visit Number 2   Number of Visits 16   Date for SLP Re-Evaluation 11/08/14  due to one month of not seen due to transportation   SLP Start Time 1148   SLP Stop Time  1230   SLP Time Calculation (min) 42 min   Activity Tolerance Patient tolerated treatment well      Past Medical History  Diagnosis Date  . CHF (congestive heart failure)   . Hypertension   . High cholesterol   . Thyroid disease   . Diabetes mellitus   . Back pain, chronic   . Renal disorder   . Obesity   . OSA on CPAP   . Edema     Past Surgical History  Procedure Laterality Date  . Nephrolithotomy      Right  . Abdominal hysterectomy    . Eye surgery    . Bunionectomy      both feet  . Left knee surgery      Torn meniscus  . US echocardiography  12/24/2011    mild LVH,mild TR,trace MR,PI  . Laparoscopic ovarian cystectomy Right 11/01/2013    Procedure: LAPAROSCOPIC RIGHT OVARIAN CYSTECTOMY WITH COLLECTION OF WASHINGS ;  Surgeon: Ena Dawley, MD;  Location: [facility];  Service: Gynecology;  Laterality: Right;  . Laparoscopic lysis of adhesions N/A 11/01/2013    Procedure: LAPAROSCOPIC LYSIS OF ADHESIONS;  Surgeon: Ena Dawley, MD;  Location: [facility];  Service: Gynecology;  Laterality: N/A;  . Laparoscopy N/A 11/01/2013    Procedure: LAPAROSCOPY DIAGNOSTIC;  Surgeon: Ena Dawley, MD;  Location: [facility];  Service: Gynecology;  Laterality: N/A;  . Tee without cardioversion N/A 05/11/2014    Procedure: TRANSESOPHAGEAL ECHOCARDIOGRAM (TEE);  Surgeon: Sanda Klein, MD;  Location: [facility];  Service: Cardiovascular;   Laterality: N/A;  . Loop recorder implant N/A 05/11/2014    Procedure: LOOP RECORDER IMPLANT;  Surgeon: Coralyn Mark, MD;  Location: [facility];  Service: Cardiovascular;  Laterality: N/A;    There were no vitals taken for this visit.  Visit Diagnosis: Apraxia  Aphasia      Subjective Assessment - 09/11/14 1153    Symptoms "I fall one time - - that's it." (re: SLP -"what has been happening since last time")             ADULT SLP TREATMENT - 09/11/14 1153    General Information   Behavior/Cognition Alert;Cooperative;Pleasant mood   Treatment Provided   Treatment provided Cognitive-Linquistic   Pain Assessment   Pain Assessment No/denies pain   Cognitive-Linquistic Treatment   Treatment focused on Apraxia;Aphasia   Skilled Treatment Discussed compensations for apraxia/aphasia with pt/dtr (slow rate, synonym). Pt req'd cues to use strategies/compensations - possibly complicated by severity of apraxia. Pt notably frustrated, rarely. Divergent naming average 3.5 items prior to requiring occasional min-mod A. Sentence level tasks (simple description) req'd quesitoning cues and consistent mod-max A. Encouraged pt to draw and had her practice with fruits. Pt with fair to good success. Had short (5-6 conversational turns) and functional conversation with dtr re: scheduling appointments with rare min cues/questions from dtr.   Assessment / Recommendations /  Plan   Plan Continue with current plan of care   Progression Toward Goals   Progression toward goals Progressing toward goals          SLP Education - 09/11/14 1529    Education provided Yes   Education Details compensations for apraxia, aphasia   Person(s) Educated Patient;Child(ren)   Methods Explanation   Comprehension Verbalized understanding;Verbal cues required          SLP Short Term Goals - 09/11/14 1531    SLP SHORT TERM GOAL #1   Title pt will complete simple naming with 85% success over three sessions  (began 08-09-14)   Time 4   Period Weeks   Status New   SLP SHORT TERM GOAL #2   Title pt will complete functional communication with simple wants/needs via multimodal communication with 80% success and rare min A (began 08-09-14)   SLP SHORT TERM GOAL #3   Title demo understanding of simple conversation 90% of the time (began 08-09-14)   Time 4   Period Weeks   Status New   SLP SHORT TERM GOAL #4   Title demo understanding of 4-5 sentence spoken information by answering questions with 90% success and occasional min A (began 08-09-14)   Time 4   Period Weeks   Status New   SLP SHORT TERM GOAL #5   Title pt will complete HEP with occasional min A (began 08-09-14)   Time 4   Period Weeks   Status New          SLP Long Term Goals - 09/11/14 1531    SLP LONG TERM GOAL #1   Title pt will demo understanding of mod complex conversation with occasional min A (began 08-09-14)   Time 8   Period Weeks  or 16 visits)   Status New   SLP LONG TERM GOAL #2   Title pt will use multimodal communication PRN without cues (began 08-09-14)   Time 8   Period Weeks  or 16 visits   Status New   SLP LONG TERM GOAL #3   Title pt will complete any HEP with rare min A (began 08-09-14)   Time 8   Period Weeks  or 16 visits   Status New          Plan - 09/11/14 1530    Clinical Impression Statement Pt presents with mod expressive and mild-mod receptive aphasia (although difficult to guage based upon extent of expressive difficulty), complicated by moderate apraxia of speech deficit.   Speech Therapy Frequency 2x / week   Duration --  8 weeks   Treatment/Interventions Language facilitation;Cueing hierarchy;Multimodal communcation approach;Functional tasks;Patient/family education;Compensatory strategies;Internal/external aids;SLP instruction and feedback   Potential to Achieve Goals Good   Potential Considerations Severity of impairments        Problem List Patient Active Problem List    Diagnosis Date Noted  . Seizure 07/12/2014  . History of CVA with residual deficit 05/30/2014  . Seizures 05/11/2014  . CVA (cerebral infarction) 05/08/2014  . Diabetes mellitus type 2, controlled 05/08/2014  . Hypothyroidism 05/08/2014  . Hypercholesteremia 11/29/2013  . Status post laparoscopy 11/01/2013  . OSA on CPAP 10/31/2013  . HTN (hypertension) 10/31/2013  . Morbid obesity 10/31/2013  . Leg edema 10/31/2013  . Ovarian cyst 10/31/2013  . Preoperative cardiovascular examination 10/31/2013  . Pelvic mass 10/31/2013  . DM type 2 (diabetes mellitus, type 2) 10/31/2013  . History of CHF (congestive heart failure) 10/31/2013  . Acute renal insufficiency  10/31/2013  . Unspecified hypothyroidism 10/31/2013    [facility], SLP 09/11/2014, 3:31 PM  [facility] [address] [place], [place], [address] Phone: [phone]   Fax:  [phone]

## 2014-09-11 NOTE — Therapy (Signed)
[facility] [address] [place], [place], [address] Phone: [phone]   Fax:  [phone]  Physical Therapy Treatment  Patient Details  Name: Abigail Johns MRN: [id number] Date of Birth: 1951-06-01 Referring Provider:  Tivis Ringer, MD  Encounter Date: 09/11/2014      PT End of Session - 09/11/14 1215    Visit Number 2   Number of Visits 9   PT Start Time 1102   PT Stop Time 1145   PT Time Calculation (min) 43 min   Equipment Utilized During Treatment Gait belt   Activity Tolerance Patient tolerated treatment well   Behavior During Therapy [facility] for tasks assessed/performed      Past Medical History  Diagnosis Date  . CHF (congestive heart failure)   . Hypertension   . High cholesterol   . Thyroid disease   . Diabetes mellitus   . Back pain, chronic   . Renal disorder   . Obesity   . OSA on CPAP   . Edema     Past Surgical History  Procedure Laterality Date  . Nephrolithotomy      Right  . Abdominal hysterectomy    . Eye surgery    . Bunionectomy      both feet  . Left knee surgery      Torn meniscus  . US echocardiography  12/24/2011    mild LVH,mild TR,trace MR,PI  . Laparoscopic ovarian cystectomy Right 11/01/2013    Procedure: LAPAROSCOPIC RIGHT OVARIAN CYSTECTOMY WITH COLLECTION OF WASHINGS ;  Surgeon: Ena Dawley, MD;  Location: [facility];  Service: Gynecology;  Laterality: Right;  . Laparoscopic lysis of adhesions N/A 11/01/2013    Procedure: LAPAROSCOPIC LYSIS OF ADHESIONS;  Surgeon: Ena Dawley, MD;  Location: [facility];  Service: Gynecology;  Laterality: N/A;  . Laparoscopy N/A 11/01/2013    Procedure: LAPAROSCOPY DIAGNOSTIC;  Surgeon: Ena Dawley, MD;  Location: [facility];  Service: Gynecology;  Laterality: N/A;  . Tee without cardioversion N/A 05/11/2014    Procedure: TRANSESOPHAGEAL ECHOCARDIOGRAM (TEE);  Surgeon: Sanda Klein, MD;  Location: [facility];  Service:  Cardiovascular;  Laterality: N/A;  . Loop recorder implant N/A 05/11/2014    Procedure: LOOP RECORDER IMPLANT;  Surgeon: Coralyn Mark, MD;  Location: [facility];  Service: Cardiovascular;  Laterality: N/A;    There were no vitals taken for this visit.  Visit Diagnosis:  Abnormality of gait  Activity intolerance  Weakness due to cerebrovascular accident      Subjective Assessment - 09/11/14 1108    Symptoms No new complaints. Did have a fall a couple weeks ago in her bedroom. She was reaching forward to open blinds and was not close enough. Was able to get herself up with grand-daughter near by just in case. No injury to report.                                      Currently in Pain? No/denies     Treatment: Exercise - supine on mat: bridge 5 sec hold x 10 reps, right straight leg raise x 10 reps, right hip/knee flexion with bringing leg off mat and back onto mat x 10 reps with min active assist.  - sit/stands x 10 reps with limited UE assist  - standing with UE support: heel raises x 10, alternating hip abduction/extension x 10 each bil legs, and single leg standing 10  sec x 3 each leg.         PT Education - 09/11/14 1136    Education provided Yes   Education Details HEP   Person(s) Educated Patient;Child(ren)   Methods Explanation;Handout;Demonstration   Comprehension Verbalized understanding;Need further instruction           PT Long Term Goals - 09/11/14 1217    PT LONG TERM GOAL #1   Title demonstrates HEP correctly. (Target Date: 10/06/14)   Time 4   Period Weeks   Status On-going   PT LONG TERM GOAL #2   Title Gait Velocity >1.0 ft/sec target date 3/12/206   Time 4   Period Weeks   Status On-going   PT LONG TERM GOAL #3   Title Berg Balance >/= 45/56 (Target Date: 10/06/14)   Time 4   Period Weeks   Status On-going   PT LONG TERM GOAL #4   Title Timed Up & Go <21 seconds (Target Date: 10/06/14)   Time 4   Period Weeks   Status On-going   PT  LONG TERM GOAL #5   Title ambulates 300' & negotiates ramp/curb with single point cane modified independent. (Target Date: 10/06/14)   Time 4   Period Weeks   Status On-going           Plan - 09/11/14 1215    Clinical Impression Statement Pt edcuated on and given HEP for strengthening at home today with no issues reported during session. Pt missed several weeks of therapy after eval due to daughter not able to get off of work until now. Spoke with evaluating PT, will extend plan of care for 4 weeks from today to allow time to meet goals.   Pt will benefit from skilled therapeutic intervention in order to improve on the following deficits Abnormal gait;Decreased activity tolerance;Decreased balance;Decreased endurance;Decreased mobility;Decreased range of motion;Decreased strength   Rehab Potential Good   PT Frequency 2x / week   PT Duration 4 weeks   PT Treatment/Interventions ADLs/Self Care Home Management;DME Instruction;Gait training;Stair training;Functional mobility training;Therapeutic activities;Therapeutic exercise;Balance training;Neuromuscular re-education;Patient/family education   PT Next Visit Plan HEP   PT Home Exercise Plan balance /strength   Consulted and Agree with Plan of Care Patient;Family member/caregiver   Family Member Consulted daughter      Problem List Patient Active Problem List   Diagnosis Date Noted  . Seizure 07/12/2014  . History of CVA with residual deficit 05/30/2014  . Seizures 05/11/2014  . CVA (cerebral infarction) 05/08/2014  . Diabetes mellitus type 2, controlled 05/08/2014  . Hypothyroidism 05/08/2014  . Hypercholesteremia 11/29/2013  . Status post laparoscopy 11/01/2013  . OSA on CPAP 10/31/2013  . HTN (hypertension) 10/31/2013  . Morbid obesity 10/31/2013  . Leg edema 10/31/2013  . Ovarian cyst 10/31/2013  . Preoperative cardiovascular examination 10/31/2013  . Pelvic mass 10/31/2013  . DM type 2 (diabetes mellitus, type 2)  10/31/2013  . History of CHF (congestive heart failure) 10/31/2013  . Acute renal insufficiency 10/31/2013  . Unspecified hypothyroidism 10/31/2013    Willow Ora 09/11/2014, 12:21 PM  Willow Ora, PTA, [facility] [address] [place], [place] [address] [phone] 09/11/2014, 12:21 PM

## 2014-09-12 NOTE — Progress Notes (Signed)
Loop recorder 

## 2014-09-13 ENCOUNTER — Ambulatory Visit: Payer: BLUE CROSS/BLUE SHIELD

## 2014-09-13 ENCOUNTER — Ambulatory Visit: Payer: BLUE CROSS/BLUE SHIELD | Admitting: Physical Therapy

## 2014-09-13 DIAGNOSIS — R4701 Aphasia: Secondary | ICD-10-CM

## 2014-09-13 DIAGNOSIS — R482 Apraxia: Secondary | ICD-10-CM

## 2014-09-13 DIAGNOSIS — I69898 Other sequelae of other cerebrovascular disease: Secondary | ICD-10-CM | POA: Diagnosis not present

## 2014-09-13 NOTE — Patient Instructions (Signed)
Work 15-20 minutes on speech homework 3 times a day.

## 2014-09-13 NOTE — Therapy (Signed)
[facility] [address] [place], [place], [address] Phone: [phone]   Fax:  [phone]  Speech Language Pathology Treatment  Patient Details  Name: Abigail Johns MRN: [id number] Date of Birth: 1951-06-20 Referring Provider:  Tivis Ringer, MD  Encounter Date: 09/13/2014      End of Session - 09/13/14 1444    Visit Number 3   Number of Visits 16   Date for SLP Re-Evaluation 11/08/14   SLP Start Time 1404   SLP Stop Time  1445   SLP Time Calculation (min) 41 min   Activity Tolerance Patient tolerated treatment well      Past Medical History  Diagnosis Date  . CHF (congestive heart failure)   . Hypertension   . High cholesterol   . Thyroid disease   . Diabetes mellitus   . Back pain, chronic   . Renal disorder   . Obesity   . OSA on CPAP   . Edema     Past Surgical History  Procedure Laterality Date  . Nephrolithotomy      Right  . Abdominal hysterectomy    . Eye surgery    . Bunionectomy      both feet  . Left knee surgery      Torn meniscus  . US echocardiography  12/24/2011    mild LVH,mild TR,trace MR,PI  . Laparoscopic ovarian cystectomy Right 11/01/2013    Procedure: LAPAROSCOPIC RIGHT OVARIAN CYSTECTOMY WITH COLLECTION OF WASHINGS ;  Surgeon: Ena Dawley, MD;  Location: [facility];  Service: Gynecology;  Laterality: Right;  . Laparoscopic lysis of adhesions N/A 11/01/2013    Procedure: LAPAROSCOPIC LYSIS OF ADHESIONS;  Surgeon: Ena Dawley, MD;  Location: [facility];  Service: Gynecology;  Laterality: N/A;  . Laparoscopy N/A 11/01/2013    Procedure: LAPAROSCOPY DIAGNOSTIC;  Surgeon: Ena Dawley, MD;  Location: [facility];  Service: Gynecology;  Laterality: N/A;  . Tee without cardioversion N/A 05/11/2014    Procedure: TRANSESOPHAGEAL ECHOCARDIOGRAM (TEE);  Surgeon: Sanda Klein, MD;  Location: [facility];  Service: Cardiovascular;  Laterality: N/A;  . Loop recorder implant N/A  05/11/2014    Procedure: LOOP RECORDER IMPLANT;  Surgeon: Coralyn Mark, MD;  Location: [facility];  Service: Cardiovascular;  Laterality: N/A;    There were no vitals taken for this visit.  Visit Diagnosis: Apraxia  Aphasia      Subjective Assessment - 09/13/14 1408    Symptoms "we had Tuesday (for therapy), and Thursday" (re: SLP "were you one of those who got cancelled Monday?")             ADULT SLP TREATMENT - 09/13/14 1409    General Information   Behavior/Cognition Cooperative;Pleasant mood;Alert   Treatment Provided   Treatment provided Cognitive-Linquistic   Pain Assessment   Pain Assessment No/denies pain   Cognitive-Linquistic Treatment   Treatment focused on Apraxia;Aphasia   Skilled Treatment  Reminded pt of compensations for speech/language (reduce rate, synonym, multimodal communication). Sentence level responses (similarities) 35% success, incr'd to 80% with mod-max A consistently. Confrontation naming completed with 40% success; incr'd to 75% with usual mod A.   Assessment / Recommendations / Plan   Plan Continue with current plan of care   Progression Toward Goals   Progression toward goals Progressing toward goals          SLP Education - 09/13/14 1444    Education provided Yes   Education Details reviewed compensations   Person(s) Educated Theatre stage manager)  Methods Explanation   Comprehension Verbalized understanding          SLP Short Term Goals - 09/13/14 1425    SLP SHORT TERM GOAL #1   Title pt will complete simple naming with 85% success over three sessions (began 08-09-14)   Time 4   Period Weeks   Status New   SLP SHORT TERM GOAL #2   Title pt will complete functional communication with simple wants/needs via multimodal communication with 80% success and rare min A (began 08-09-14)   SLP Victor #3   Title demo understanding of simple conversation 90% of the time (began 08-09-14)   Time 4   Period Weeks   Status New   SLP  SHORT TERM GOAL #4   Title demo understanding of 4-5 sentence spoken information by answering questions with 90% success and occasional min A (began 08-09-14)   Time 4   Period Weeks   Status New   SLP SHORT TERM GOAL #5   Title pt will complete HEP with occasional min A (began 08-09-14)   Time 4   Period Weeks   Status New          SLP Long Term Goals - 09/13/14 1446    SLP LONG TERM GOAL #1   Title pt will demo understanding of mod complex conversation with occasional min A (began 08-09-14)   Time 8   Period Weeks  or 16 visits)   Status On-going   SLP LONG TERM GOAL #2   Title pt will use multimodal communication PRN without cues (began 08-09-14)   Time 8   Period Weeks  or 16 visits   Status On-going   SLP LONG TERM GOAL #3   Title pt will complete any HEP with rare min A (began 08-09-14)   Time 8   Period Weeks  or 16 visits   Status On-going          Plan - 09/13/14 1446    Clinical Impression Statement Pt presents with mod expressive and mild-mod receptive aphasia (although difficult to guage based upon extent of expressive difficulty), complicated by moderate apraxia of speech deficit.   Speech Therapy Frequency 2x / week   Duration --  8 weeks   Treatment/Interventions Language facilitation;Cueing hierarchy;Multimodal communcation approach;Functional tasks;Patient/family education;Compensatory strategies;Internal/external aids;SLP instruction and feedback   Potential to Achieve Goals Good        Problem List Patient Active Problem List   Diagnosis Date Noted  . Seizure 07/12/2014  . History of CVA with residual deficit 05/30/2014  . Seizures 05/11/2014  . CVA (cerebral infarction) 05/08/2014  . Diabetes mellitus type 2, controlled 05/08/2014  . Hypothyroidism 05/08/2014  . Hypercholesteremia 11/29/2013  . Status post laparoscopy 11/01/2013  . OSA on CPAP 10/31/2013  . HTN (hypertension) 10/31/2013  . Morbid obesity 10/31/2013  . Leg edema  10/31/2013  . Ovarian cyst 10/31/2013  . Preoperative cardiovascular examination 10/31/2013  . Pelvic mass 10/31/2013  . DM type 2 (diabetes mellitus, type 2) 10/31/2013  . History of CHF (congestive heart failure) 10/31/2013  . Acute renal insufficiency 10/31/2013  . Unspecified hypothyroidism 10/31/2013    [facility], SLP 09/13/2014, 2:48 PM  [facility] [address] [place], [place], [address] Phone: [phone]   Fax:  [phone]

## 2014-09-17 ENCOUNTER — Encounter: Payer: Self-pay | Admitting: Cardiovascular Disease

## 2014-09-18 ENCOUNTER — Ambulatory Visit (INDEPENDENT_AMBULATORY_CARE_PROVIDER_SITE_OTHER): Payer: BLUE CROSS/BLUE SHIELD | Admitting: Neurology

## 2014-09-18 ENCOUNTER — Encounter: Payer: Self-pay | Admitting: Neurology

## 2014-09-18 VITALS — BP 101/65 | HR 78 | Ht 66.0 in | Wt 210.8 lb

## 2014-09-18 DIAGNOSIS — Z8679 Personal history of other diseases of the circulatory system: Secondary | ICD-10-CM

## 2014-09-18 DIAGNOSIS — E119 Type 2 diabetes mellitus without complications: Secondary | ICD-10-CM

## 2014-09-18 DIAGNOSIS — E7800 Pure hypercholesterolemia, unspecified: Secondary | ICD-10-CM

## 2014-09-18 DIAGNOSIS — I10 Essential (primary) hypertension: Secondary | ICD-10-CM

## 2014-09-18 DIAGNOSIS — R569 Unspecified convulsions: Secondary | ICD-10-CM

## 2014-09-18 DIAGNOSIS — I63412 Cerebral infarction due to embolism of left middle cerebral artery: Secondary | ICD-10-CM

## 2014-09-18 MED ORDER — CLOPIDOGREL BISULFATE 75 MG PO TABS: 75.0000 mg | ORAL_TABLET | Freq: Every day | ORAL | Status: AC

## 2014-09-18 NOTE — Progress Notes (Signed)
STROKE NEUROLOGY FOLLOW UP NOTE  NAME: Abigail Johns DOB: 02/27/1951  REASON FOR VISIT: stroke follow up HISTORY FROM: chart and daughter  Today we had the pleasure of seeing Abigail Johns in follow-up at our Neurology Clinic. Pt was accompanied by daughter.   History Summary Abigail Johns is a 64 y.o. female PMH of HTN, DM, CHF, HLD intolerate to statin, thyroid disease, right glass eye, OSA, and recent stroke in 9/15 at [facility] in [place], [place] that left her with residual right side hemiparesis was admitted on 05/08/14 for unresponsiveness. There was no mentioning of any urinary or stool incontinence or shaking. EMS was then called by the pt's daughter where she continued to be non responsive, with right sided hemiparesis, and some rapid eye movement. No frank seizure like activity seen. Concerning for seizure episodes, EEG showed no seizure but left frontotemporal cerebral dysfunction consistent with previous stroke. She was put on keppra. MRI showed left temporal subacute stroke but also acute left coronal radiata and BG subcortical stroke. MRA showed left M1 severe stenosis. Her stroke considered emboli pattern but coronal radiata and BG stroke could also be due to hypotension in the setting of M1 high grade stenosis. TEE was done no cardiac source of emboli. Loop recorder was placed and pt sent to inpt rehab with full dose ASA as well as zetia.  07/12/14 follow up - the patient has been doing better. However, still weak on the left with sensory loss, with partial aphasia. Her BP in good control and today 127/73 in clinic. Pt stated that her glucose at home also good although did not give me numbers. Loop recording so far no afib episodes. Her therapies were stopped for some time due to insurance switch. Now she has coverage, will need to restart the therapy.  Interval History During the interval time, the patient was doing slightly better. She started to  have PT OT rehabilitation last week, she is going to do twice week. SBP around 120 at home as per daughter. However this morning is 101/65 in clinic. As per daughter her diabetes controlled pretty good, currently off insulin. On glipizide and metformin. However, patient stated that she has alopecia after taking Keppra. She has no seizure episode reported. Loop recorder did not show atrial fibrillation episodes so far.  REVIEW OF SYSTEMS: Full 14 system review of systems performed and notable only for those listed below and in HPI above, all others are negative:  Constitutional: N/A  Cardiovascular: N/A  Ear/Nose/Throat: N/A  Skin: N/A  Eyes: N/A  Respiratory: N/A  Gastroitestinal: N/A  Genitourinary: N/A Hematology/Lymphatic: N/A  Endocrine: N/A  Musculoskeletal: N/A  Allergy/Immunology: N/A  Neurological: N/A  Psychiatric: N/A  The following represents the patient's updated allergies and side effects list: Allergies  Allergen Reactions  . Lipitor [Atorvastatin Calcium] Other (See Comments)    Myocitis  . Crestor [Rosuvastatin]     myalgias    The neurologically relevant items on the patient's problem list were reviewed on today's visit.  Neurologic Examination  A problem focused neurological exam (12 or more points of the single system neurologic examination, vital signs counts as 1 point, cranial nerves count for 8 points) was performed.  Blood pressure 101/65, pulse 78, height 5\' 6"  (1.676 m), weight 210 lb 12.8 oz (95.618 kg).  General - Well nourished, well developed, in no apparent distress.  Ophthalmologic - right eye glass, left eye not able to see through.  Cardiovascular - Regular rate and  rhythm with no murmur.  Mental Status -  Level of arousal and orientation to time, place, and person were intact. Partial expressive aphasia, improved, able to name objects but not parts of the objects, able to repeat but slow, consistent with transcortical motor aphasia.    Cranial Nerves II - XII - II - Visual field intact OU. III, IV, VI - Extraocular movements intact. V - Facial sensation symmetrical bilaterally. VII - mild right facial droop. VIII - Hearing & vestibular intact bilaterally. X - Palate elevates symmetrically, mild dysarthria. XI - Chin turning & shoulder shrug intact bilaterally. XII - Tongue protrusion intact.  Motor Strength - The patient's strength 4/5 RUE proximal with pronator drift, 4+/5 RUE distally, RLE 5-/5. LUE and LLE 5/5.  Bulk was normal and fasciculations were absent.   Motor Tone - Muscle tone was assessed at the neck and appendages and was normal.  Reflexes - The patient's reflexes were 1+ in all extremities and she had no pathological reflexes.  Sensory - Light touch, temperature/pinprick were symmetrical bilaterally.    Coordination - The patient had normal movements in the hands and feet with no ataxia or dysmetria.  Tremor was absent.  Gait and Station - right mild hemiparetic gait, walking with the cane.  Data reviewed: I personally reviewed the images and agree with the radiology interpretations.  Ct Abdomen Pelvis Wo Contrast 05/09/2014 1. No acute intra-abdominal findings. 2. Nonobstructive bilateral nephrolithiasis. 3. Hepatic steatosis.   Dg Chest 2 View 05/09/2014 Low lung volumes with bibasilar atelectasis.   Ct Head Wo Contrast 05/09/2014 1. Grossly unchanged ill-defined left-sided periventricular hypodensities compatible with the known acute infarct demonstrated on brain MRI performed day prior. No CT evidence of hemorrhagic conversion or new acute large territory infarct. 2. Increased attenuation of multiple sulci within the left temporal lobe compatible with mineralization as demonstrated on prior brain MRI.  05/08/2014 There is hemorrhage in the parenchyma of the lateral left temporal lobe. This appearance raises question of amyloid angiography. There is a questionable acute small  infarct in the anterior limb of the left internal capsule. There is atrophy with patchy periventricular small vessel disease. There is no subdural or epidural fluid. 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  It should be noted that the patient by report had a CVA approximately 1 month ago. It is possible that the apparent hemorrhage in the left temporal region actually represents calcification as a reactive response to prior infarct. It is possible that there is both calcification and hemorrhage in this area. Given this circumstance, MR could be most helpful for further assessment with respect to these differential considerations.  Mr Brain Wo Contrast 05/08/2014 Moderately extensive acute LEFT subcortical white matter infarction. Abnormal LEFT temporal lobe on CT consistent with mineralization of a subacute insult. No parenchymal hemorrhage is evident.   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   2D Echocardiogram No cardiac source of emboli was indentified. EF 60-65%   Carotid Doppler No evidence of hemodynamically significant internal carotid artery stenosis. Vertebral artery flow is antegrade.   EEG - Clinical Interpretation: This EEG demonstrates evidence of a left frontotemporal cerebral dysfunction that is etiologically nonspecific. There was no seizure or seizure predisposition recorded on this study.   TEE 05/11/2014 Normal TEE. No cardiac source of embolism.  Loop recording - no afib so far  Component     Latest Ref Rng 05/09/2014  Cholesterol  0 - 200 mg/dL 200  Triglycerides     <150 mg/dL 198 (H)  HDL     >39 mg/dL 40  Total CHOL/HDL Ratio      5.0  VLDL     0 - 40 mg/dL 40  LDL (calc)     0 - 99 mg/dL 120 (H)  Hgb A1c MFr Bld     <5.7 % 7.3 (H)  Mean Plasma Glucose     <117 mg/dL 163 (H)  TSH     0.350 - 4.500 uIU/mL 3.160    Assessment: As you may recall, she is a  64 y.o. African American female with PMH of HTN, DM, CHF, HLD intolerate to statin, thyroid disease, right glass eye, OSA, and recent stroke in 9/15 at [facility] in [place], [place] that left her with residual right side hemiparesis was admitted on 05/08/14 for unresponsiveness. Concerning for seizure episodes, EEG showed no seizure but left frontotemporal cerebral dysfunction consistent with previous stroke. She was put on keppra. MRI showed left temporal subacute stroke but also acute left coronal radiata and BG subcortical stroke. MRA showed left M1 severe stenosis. Her stroke considered emboli pattern but coronal radiata and BG stroke could also be due to hypotension in the setting of M1 high grade stenosis. TEE was done no cardiac source of emboli. Loop recorder was placed and so far no Afib episodes. She is not a RESPECT ESUS candidate due to left M1 high-grade stenosis.  Plan:  - Switch from ASA to Plavix for stroke prevention - zetia for HLD due to intolerance of statins - due to left M1 high grade stenosis, will keep BP at 120-150. Check blood pressure at home and record and bring over to PCP for blood pressure medication adjustment. Avoid hypotension. - check glucose at home - Follow up with your primary care physician for stroke risk factor modification. Recommend maintain blood pressure goal <130/80, diabetes with hemoglobin A1c goal below 6.5% and lipids with LDL cholesterol goal below 70 mg/dL.  - Taper off keppra. - Continue PT/OT/speech  - RTC in 3 months.   No orders of the defined types were placed in this encounter.    Meds ordered this encounter  Medications  . clopidogrel (PLAVIX) 75 MG tablet    Sig: Take 1 tablet (75 mg total) by mouth daily.    Dispense:  90 tablet    Refill:  3    Patient Instructions  - switch ASA to plavix for stroke prevention - zetia for HLD - due to left M1 high grade stenosis, will keep BP at 120-150 - check BP and glucose  at home and record - Follow up with your primary care physician for stroke risk factor modification. Recommend maintain blood pressure goal <130/80, diabetes with hemoglobin A1c goal below 6.5% and lipids with LDL cholesterol goal below 70 mg/dL.  - taper off keppra, 250mg  twice a day for 7 days and then 250mg  at night for 7 days and then off - continue PT/OT/speech - RTC in 3 months.    Rosalin Hawking, MD PhD [facility] [address], [place], [place] [address] [phone]   vision as you for the

## 2014-09-18 NOTE — Patient Instructions (Signed)
-   switch ASA to plavix for stroke prevention - zetia for HLD - due to left M1 high grade stenosis, will keep BP at 120-150 - check BP and glucose at home and record - Follow up with your primary care physician for stroke risk factor modification. Recommend maintain blood pressure goal <130/80, diabetes with hemoglobin A1c goal below 6.5% and lipids with LDL cholesterol goal below 70 mg/dL.  - taper off keppra, 250mg  twice a day for 7 days and then 250mg  at night for 7 days and then off - continue PT/OT/speech - RTC in 3 months.

## 2014-09-19 LAB — MDC_IDC_ENUM_SESS_TYPE_REMOTE

## 2014-09-20 ENCOUNTER — Ambulatory Visit: Payer: BLUE CROSS/BLUE SHIELD

## 2014-09-20 DIAGNOSIS — I69898 Other sequelae of other cerebrovascular disease: Secondary | ICD-10-CM | POA: Diagnosis not present

## 2014-09-20 DIAGNOSIS — R482 Apraxia: Secondary | ICD-10-CM

## 2014-09-20 DIAGNOSIS — Z0289 Encounter for other administrative examinations: Secondary | ICD-10-CM

## 2014-09-20 DIAGNOSIS — R4701 Aphasia: Secondary | ICD-10-CM

## 2014-09-20 NOTE — Therapy (Signed)
[facility] [address] [place], [place], [address] Phone: [phone]   Fax:  [phone]  Speech Language Pathology Treatment  Patient Details  Name: Abigail Johns MRN: [id number] Date of Birth: 1951/01/05 Referring Provider:  Tivis Ringer, MD  Encounter Date: 09/20/2014      End of Session - 09/20/14 1355    Visit Number 4   Number of Visits 16   Date for SLP Re-Evaluation 11/08/14   SLP Start Time 15   SLP Stop Time  1400   SLP Time Calculation (min) 42 min   Activity Tolerance Patient tolerated treatment well      Past Medical History  Diagnosis Date  . CHF (congestive heart failure)   . Hypertension   . High cholesterol   . Thyroid disease   . Diabetes mellitus   . Back pain, chronic   . Renal disorder   . Obesity   . OSA on CPAP   . Edema     Past Surgical History  Procedure Laterality Date  . Nephrolithotomy      Right  . Abdominal hysterectomy    . Eye surgery    . Bunionectomy      both feet  . Left knee surgery      Torn meniscus  . US echocardiography  12/24/2011    mild LVH,mild TR,trace MR,PI  . Laparoscopic ovarian cystectomy Right 11/01/2013    Procedure: LAPAROSCOPIC RIGHT OVARIAN CYSTECTOMY WITH COLLECTION OF WASHINGS ;  Surgeon: Ena Dawley, MD;  Location: [facility];  Service: Gynecology;  Laterality: Right;  . Laparoscopic lysis of adhesions N/A 11/01/2013    Procedure: LAPAROSCOPIC LYSIS OF ADHESIONS;  Surgeon: Ena Dawley, MD;  Location: [facility];  Service: Gynecology;  Laterality: N/A;  . Laparoscopy N/A 11/01/2013    Procedure: LAPAROSCOPY DIAGNOSTIC;  Surgeon: Ena Dawley, MD;  Location: [facility];  Service: Gynecology;  Laterality: N/A;  . Tee without cardioversion N/A 05/11/2014    Procedure: TRANSESOPHAGEAL ECHOCARDIOGRAM (TEE);  Surgeon: Sanda Klein, MD;  Location: [facility];  Service: Cardiovascular;  Laterality: N/A;  . Loop recorder implant N/A  05/11/2014    Procedure: LOOP RECORDER IMPLANT;  Surgeon: Coralyn Mark, MD;  Location: [facility];  Service: Cardiovascular;  Laterality: N/A;    There were no vitals taken for this visit.  Visit Diagnosis: Apraxia  Aphasia      Subjective Assessment - 09/20/14 1326    Symptoms Pt attempted to tell SLP medical history, requiring mod A from SLP usually.   Currently in Pain? No/denies             ADULT SLP TREATMENT - 09/20/14 1327    General Information   Behavior/Cognition Alert;Cooperative;Pleasant mood   Treatment Provided   Treatment provided Cognitive-Linquistic   Pain Assessment   Pain Assessment No/denies pain   Cognitive-Linquistic Treatment   Treatment focused on Apraxia;Aphasia   Skilled Treatment "Tell me one thing" about pictures completed with 90% success. Pt able to add two-four more things with extra time and with slow labored speech. SLP facilitated simple confrontation naming 92% success with extended time. Simple conversation with extra time and questioning cues from SLP; this level conversation still nonfunctional.    Assessment / Recommendations / Plan   Plan Continue with current plan of care   Progression Toward Goals   Progression toward goals Progressing toward goals            SLP Short Term Goals - 09/20/14 1347  SLP SHORT TERM GOAL #1   Title pt will complete simple naming with 85% success over three sessions (began 08-09-14)   Time 3   Period Weeks   Status On-going   SLP SHORT TERM GOAL #2   Title pt will complete functional communication with simple wants/needs via multimodal communication with 80% success and rare min A (began 08-09-14)   Time 3   Period Weeks   Status Deferred  Pt no longer needs   SLP SHORT TERM GOAL #3   Title demo understanding of simple conversation 90% of the time (began 08-09-14)   Time 4   Period Weeks   Status On-going   SLP SHORT TERM GOAL #4   Title demo understanding of 4-5 sentence spoken  information by answering questions with 90% success and occasional min A (began 08-09-14)   Time 3   Period Weeks   Status On-going   SLP SHORT TERM GOAL #5   Title pt will complete HEP with occasional min A (began 08-09-14)   Time 3   Period Weeks   Status New          SLP Long Term Goals - 09/20/14 1359    SLP LONG TERM GOAL #1   Title pt will demo understanding of mod complex conversation with occasional min A (began 08-09-14)   Time 7   Period Weeks  or 16 visits)   Status On-going   SLP LONG TERM GOAL #2   Title pt will use multimodal communication PRN without cues (began 08-09-14)   Time 7   Period Weeks  or 16 visits   Status On-going   SLP LONG TERM GOAL #3   Title pt will complete any HEP with rare min A (began 08-09-14)   Time 7   Period Weeks  or 16 visits   Status On-going          Plan - 09/20/14 1355    Clinical Impression Statement Apraxia and aphasaia persist. Pt with what appears to be anxiety during verbal exression tasks today.   Speech Therapy Frequency 2x / week   Duration --  7 weeks   Treatment/Interventions Language facilitation;Cueing hierarchy;Multimodal communcation approach;Functional tasks;Patient/family education;Compensatory strategies;Internal/external aids;SLP instruction and feedback   Potential to Achieve Goals Good   Potential Considerations Severity of impairments        Problem List Patient Active Problem List   Diagnosis Date Noted  . Essential hypertension 09/18/2014  . Seizure 07/12/2014  . History of CVA with residual deficit 05/30/2014  . Seizures 05/11/2014  . CVA (cerebral infarction) 05/08/2014  . Diabetes mellitus type 2, controlled 05/08/2014  . Hypothyroidism 05/08/2014  . Hypercholesteremia 11/29/2013  . Status post laparoscopy 11/01/2013  . OSA on CPAP 10/31/2013  . HTN (hypertension) 10/31/2013  . Morbid obesity 10/31/2013  . Leg edema 10/31/2013  . Ovarian cyst 10/31/2013  . Preoperative cardiovascular  examination 10/31/2013  . Pelvic mass 10/31/2013  . DM type 2 (diabetes mellitus, type 2) 10/31/2013  . History of CHF (congestive heart failure) 10/31/2013  . Acute renal insufficiency 10/31/2013  . Unspecified hypothyroidism 10/31/2013    Garald Balding, SLP 09/20/2014, 2:00 PM  [facility] [address] [place], [place], [address] Phone: [phone]   Fax:  [phone]

## 2014-09-25 ENCOUNTER — Encounter: Payer: Self-pay | Admitting: Physical Therapy

## 2014-09-25 ENCOUNTER — Ambulatory Visit: Payer: BLUE CROSS/BLUE SHIELD | Attending: Neurology | Admitting: Physical Therapy

## 2014-09-25 ENCOUNTER — Ambulatory Visit: Payer: BLUE CROSS/BLUE SHIELD | Admitting: Occupational Therapy

## 2014-09-25 ENCOUNTER — Ambulatory Visit: Payer: BLUE CROSS/BLUE SHIELD

## 2014-09-25 DIAGNOSIS — R279 Unspecified lack of coordination: Secondary | ICD-10-CM | POA: Diagnosis not present

## 2014-09-25 DIAGNOSIS — R4701 Aphasia: Secondary | ICD-10-CM

## 2014-09-25 DIAGNOSIS — IMO0002 Reserved for concepts with insufficient information to code with codable children: Secondary | ICD-10-CM

## 2014-09-25 DIAGNOSIS — M6281 Muscle weakness (generalized): Secondary | ICD-10-CM | POA: Diagnosis not present

## 2014-09-25 DIAGNOSIS — I69898 Other sequelae of other cerebrovascular disease: Secondary | ICD-10-CM | POA: Insufficient documentation

## 2014-09-25 DIAGNOSIS — I69359 Hemiplegia and hemiparesis following cerebral infarction affecting unspecified side: Secondary | ICD-10-CM | POA: Diagnosis not present

## 2014-09-25 DIAGNOSIS — I6980 Unspecified sequelae of other cerebrovascular disease: Secondary | ICD-10-CM | POA: Diagnosis not present

## 2014-09-25 DIAGNOSIS — R482 Apraxia: Secondary | ICD-10-CM | POA: Insufficient documentation

## 2014-09-25 DIAGNOSIS — R2689 Other abnormalities of gait and mobility: Secondary | ICD-10-CM

## 2014-09-25 DIAGNOSIS — R269 Unspecified abnormalities of gait and mobility: Secondary | ICD-10-CM | POA: Insufficient documentation

## 2014-09-25 DIAGNOSIS — R6889 Other general symptoms and signs: Secondary | ICD-10-CM | POA: Diagnosis not present

## 2014-09-25 DIAGNOSIS — R531 Weakness: Secondary | ICD-10-CM | POA: Insufficient documentation

## 2014-09-25 NOTE — Patient Instructions (Signed)
  Coordination Activities  Perform the following activities for 15-20 minutes 1-2 times per day with right hand(s).   Rotate ball in fingertips (clockwise and counter-clockwise).  Toss ball in air and catch with the same hand.  Flip cards 1 at a time as fast as you can.  Deal cards with your thumb (Hold deck in hand and push card off top with thumb).  Rotate card in hand (clockwise and counter-clockwise).  Pick up coins one at a time until you get 5-10 in your hand, then move coins from palm to fingertips to stack one at a time.  Twirl pen between fingers.  Practice writing and/or typing.

## 2014-09-25 NOTE — Therapy (Signed)
[facility] [address] [place], [place], [address] Phone: [phone]   Fax:  [phone]  Physical Therapy Treatment  Patient Details  Name: Abigail Johns MRN: [id number] Date of Birth: Mar 08, 1951 Referring Provider:  Tivis Ringer, MD  Encounter Date: 09/25/2014      PT End of Session - 09/25/14 1223    Visit Number 3   Number of Visits 9   PT Start Time 1100   PT Stop Time 1145   PT Time Calculation (min) 45 min   Equipment Utilized During Treatment Gait belt   Activity Tolerance Patient tolerated treatment well   Behavior During Therapy A[facility] for tasks assessed/performed      Past Medical History  Diagnosis Date  . CHF (congestive heart failure)   . Hypertension   . High cholesterol   . Thyroid disease   . Diabetes mellitus   . Back pain, chronic   . Renal disorder   . Obesity   . OSA on CPAP   . Edema     Past Surgical History  Procedure Laterality Date  . Nephrolithotomy      Right  . Abdominal hysterectomy    . Eye surgery    . Bunionectomy      both feet  . Left knee surgery      Torn meniscus  . US echocardiography  12/24/2011    mild LVH,mild TR,trace MR,PI  . Laparoscopic ovarian cystectomy Right 11/01/2013    Procedure: LAPAROSCOPIC RIGHT OVARIAN CYSTECTOMY WITH COLLECTION OF WASHINGS ;  Surgeon: Abigail Dawley, MD;  [facility]: [facility];  Service: Gynecology;  Laterality: Right;  . Laparoscopic lysis of adhesions N/A 11/01/2013    Procedure: LAPAROSCOPIC LYSIS OF ADHESIONS;  Surgeon: Abigail Dawley, MD;  [facility]: [facility];  Service: Gynecology;  Laterality: N/A;  . Laparoscopy N/A 11/01/2013    Procedure: LAPAROSCOPY DIAGNOSTIC;  Surgeon: Abigail Dawley, MD;  Location: [facility];  Service: Gynecology;  Laterality: N/A;  . Tee without cardioversion N/A 05/11/2014    Procedure: TRANSESOPHAGEAL ECHOCARDIOGRAM (TEE);  Surgeon: Abigail Klein, MD;  Location: [facility];  Service:  Cardiovascular;  Laterality: N/A;  . Loop recorder implant N/A 05/11/2014    Procedure: LOOP RECORDER IMPLANT;  Surgeon: Abigail Mark, MD;  Location: [facility];  Service: Cardiovascular;  Laterality: N/A;    There were no vitals taken for this visit.  Visit Diagnosis:  Abnormality of gait  Activity intolerance  Weakness due to cerebrovascular accident  Balance problems      Subjective Assessment - 09/25/14 1113    Symptoms No falls or issues since last session. She has been doing HEP issues last session.   Currently in Pain? No/denies     Neuromuscular Re-ed: Balance HEP in corner for safety: floor eyes open & eyes closed; foam eyes open & eyes closed; alternate kicks forward, side & back Gait Training: Patient ambulated 250' & 150' with single point cane with supervision.                        PT Education - 09/25/14 1220    Education provided Yes   Education Details HEP for balance in corner, single point cane vs SBQC   Person(s) Educated Patient;Child(ren)   Methods Explanation;Demonstration;Handout   Comprehension Verbalized understanding;Returned demonstration;Need further instruction             PT Long Term Goals - 09/11/14 1217    PT LONG TERM GOAL #1  Title demonstrates HEP correctly. (Target Date: 10/06/14)   Time 4   Period Weeks   Status On-going   PT LONG TERM GOAL #2   Title Gait Velocity >1.0 ft/sec target date 3/12/206   Time 4   Period Weeks   Status On-going   PT LONG TERM GOAL #3   Title Berg Balance >/= 45/56 (Target Date: 10/06/14)   Time 4   Period Weeks   Status On-going   PT LONG TERM GOAL #4   Title Timed Up & Go <21 seconds (Target Date: 10/06/14)   Time 4   Period Weeks   Status On-going   PT LONG TERM GOAL #5   Title ambulates 300' & negotiates ramp/curb with single point cane modified independent. (Target Date: 10/06/14)   Time 4   Period Weeks   Status On-going               Plan - 09/25/14  1224    Clinical Impression Statement Patient appears to understand HEP for balance. Patient appears safe to use a single point cane with family only on level surfaces.   Pt will benefit from skilled therapeutic intervention in order to improve on the following deficits Abnormal gait;Decreased activity tolerance;Decreased balance;Decreased endurance;Decreased mobility;Decreased range of motion;Decreased strength   Rehab Potential Good   PT Frequency 2x / week   PT Duration 4 weeks   PT Treatment/Interventions ADLs/Self Care Home Management;DME Instruction;Gait training;Stair training;Functional mobility training;Therapeutic activities;Therapeutic exercise;Balance training;Neuromuscular re-education;Patient/family education   PT Next Visit Plan check HEP, gait with single point cane including barriers.   Consulted and Agree with Plan of Care Patient;Family member/caregiver   Family Member Consulted daughter        Problem List Patient Active Problem List   Diagnosis Date Noted  . Essential hypertension 09/18/2014  . Seizure 07/12/2014  . History of CVA with residual deficit 05/30/2014  . Seizures 05/11/2014  . CVA (cerebral infarction) 05/08/2014  . Diabetes mellitus type 2, controlled 05/08/2014  . Hypothyroidism 05/08/2014  . Hypercholesteremia 11/29/2013  . Status post laparoscopy 11/01/2013  . OSA on CPAP 10/31/2013  . HTN (hypertension) 10/31/2013  . Morbid obesity 10/31/2013  . Leg edema 10/31/2013  . Ovarian cyst 10/31/2013  . Preoperative cardiovascular examination 10/31/2013  . Pelvic mass 10/31/2013  . DM type 2 (diabetes mellitus, type 2) 10/31/2013  . History of CHF (congestive heart failure) 10/31/2013  . Acute renal insufficiency 10/31/2013  . Unspecified hypothyroidism 10/31/2013    Abigail Johns  PT, DPT  09/25/2014, 12:27 PM  [facility] [address] [place], [place], [address] Phone: [phone]    Fax:  [phone]

## 2014-09-25 NOTE — Therapy (Signed)
[facility] [address] [place], [place], [address] Phone: [phone]   Fax:  [phone]  Speech Language Pathology Treatment  Patient Details  Name: Abigail Johns MRN: [id number] Date of Birth: Sep 15, 1950 Referring Provider:  Tivis Ringer, MD  Encounter Date: 09/25/2014      End of Session - 09/25/14 1102    Visit Number 5   Number of Visits 16   Date for SLP Re-Evaluation 11/08/14   SLP Start Time 1   SLP Stop Time  1101   SLP Time Calculation (min) 43 min   Activity Tolerance Patient tolerated treatment well      Past Medical History  Diagnosis Date  . CHF (congestive heart failure)   . Hypertension   . High cholesterol   . Thyroid disease   . Diabetes mellitus   . Back pain, chronic   . Renal disorder   . Obesity   . OSA on CPAP   . Edema     Past Surgical History  Procedure Laterality Date  . Nephrolithotomy      Right  . Abdominal hysterectomy    . Eye surgery    . Bunionectomy      both feet  . Left knee surgery      Torn meniscus  . US echocardiography  12/24/2011    mild LVH,mild TR,trace MR,PI  . Laparoscopic ovarian cystectomy Right 11/01/2013    Procedure: LAPAROSCOPIC RIGHT OVARIAN CYSTECTOMY WITH COLLECTION OF WASHINGS ;  Surgeon: Ena Dawley, MD;  Location: [facility];  Service: Gynecology;  Laterality: Right;  . Laparoscopic lysis of adhesions N/A 11/01/2013    Procedure: LAPAROSCOPIC LYSIS OF ADHESIONS;  Surgeon: Ena Dawley, MD;  Location: [facility];  Service: Gynecology;  Laterality: N/A;  . Laparoscopy N/A 11/01/2013    Procedure: LAPAROSCOPY DIAGNOSTIC;  Surgeon: Ena Dawley, MD;  Location: [facility];  Service: Gynecology;  Laterality: N/A;  . Tee without cardioversion N/A 05/11/2014    Procedure: TRANSESOPHAGEAL ECHOCARDIOGRAM (TEE);  Surgeon: Sanda Klein, MD;  Location: [facility];  Service: Cardiovascular;  Laterality: N/A;  . Loop recorder implant N/A  05/11/2014    Procedure: LOOP RECORDER IMPLANT;  Surgeon: Coralyn Mark, MD;  Location: [facility];  Service: Cardiovascular;  Laterality: N/A;    There were no vitals taken for this visit.  Visit Diagnosis: Aphasia  Apraxia      Subjective Assessment - 09/25/14 1043    Symptoms "My homework - I did - verbally, but harder."             ADULT SLP TREATMENT - 09/25/14 1044    General Information   Behavior/Cognition Alert;Cooperative;Pleasant mood;Lethargic   Treatment Provided   Treatment provided Cognitive-Linquistic   Pain Assessment   Pain Assessment No/denies pain   Cognitive-Linquistic Treatment   Treatment focused on Aphasia;Apraxia   Skilled Treatment Pt verbally described pictures 30% success (subject-verb-object); improved to 50% with mod A. Confrontation naming 88% success. All answers provided with extra time. Pt req'd min-mod A from SLP for verbal error awareness occasionally.   Assessment / Recommendations / Plan   Plan Continue with current plan of care   Progression Toward Goals   Progression toward goals Progressing toward goals            SLP Short Term Goals - 09/25/14 1103    SLP SHORT TERM GOAL #1   Title pt will complete simple naming with 85% success over three sessions (began 08-09-14)   Time 2  Period Weeks   Status On-going   SLP SHORT TERM GOAL #2   Title pt will complete functional communication with simple wants/needs via multimodal communication with 80% success and rare min A (began 08-09-14)   Status Deferred  Pt no longer needs   SLP SHORT TERM GOAL #3   Title demo understanding of simple conversation 90% of the time (began 08-09-14)   Time 2   Period Weeks   Status On-going   SLP SHORT TERM GOAL #4   Title demo understanding of 4-5 sentence spoken information by answering questions with 90% success and occasional min A (began 08-09-14)   Time 2   Period Weeks   Status On-going   SLP SHORT TERM GOAL #5   Title pt will  complete HEP with occasional min A (began 08-09-14)   Time 2   Period Weeks   Status New          SLP Long Term Goals - 09/25/14 1104    SLP LONG TERM GOAL #1   Title pt will demo understanding of mod complex conversation with occasional min A (began 08-09-14)   Time 6   Period Weeks  or 16 visits)   Status On-going   SLP LONG TERM GOAL #2   Title pt will use multimodal communication PRN without cues (began 08-09-14)   Time 6   Period Weeks  or 16 visits   Status On-going   SLP LONG TERM GOAL #3   Title pt will complete any HEP with rare min A (began 08-09-14)   Time 6   Period Weeks  or 16 visits   Status On-going          Plan - 09/25/14 1102    Clinical Impression Statement Apraxia and aphasaia persist. Pt with what appears to be anxiety during verbal exression tasks again.   Speech Therapy Frequency 2x / week   Duration --  6 weeks   Treatment/Interventions Language facilitation;Cueing hierarchy;Multimodal communcation approach;Functional tasks;Patient/family education;Compensatory strategies;Internal/external aids;SLP instruction and feedback   Potential to Achieve Goals Good   Potential Considerations Severity of impairments        Problem List Patient Active Problem List   Diagnosis Date Noted  . Essential hypertension 09/18/2014  . Seizure 07/12/2014  . History of CVA with residual deficit 05/30/2014  . Seizures 05/11/2014  . CVA (cerebral infarction) 05/08/2014  . Diabetes mellitus type 2, controlled 05/08/2014  . Hypothyroidism 05/08/2014  . Hypercholesteremia 11/29/2013  . Status post laparoscopy 11/01/2013  . OSA on CPAP 10/31/2013  . HTN (hypertension) 10/31/2013  . Morbid obesity 10/31/2013  . Leg edema 10/31/2013  . Ovarian cyst 10/31/2013  . Preoperative cardiovascular examination 10/31/2013  . Pelvic mass 10/31/2013  . DM type 2 (diabetes mellitus, type 2) 10/31/2013  . History of CHF (congestive heart failure) 10/31/2013  . Acute renal  insufficiency 10/31/2013  . Unspecified hypothyroidism 10/31/2013    [facility], SLP 09/25/2014, 11:06 AM  [facility] [address] [place], [place], [address] Phone: [phone]   Fax:  [phone]

## 2014-09-25 NOTE — Patient Instructions (Signed)
Feet Together, Head Motion - Eyes Open   With eyes open, feet together, move head slowly: up and down right and left, diagonals (Up-right to down-left and Up-left to down-right). Repeat _10-15___ times each direction per session. Do __1__ sessions per day.  Copyright  VHI. All rights reserved.  Feet Apart, Head Motion - Eyes Closed   With eyes closed and feet shoulder width apart, move head slowly, up and down right and left, diagonals (Up-right to down-left and Up-left to down-right). Repeat __10-15__ times each direction per session. Do __1__ sessions per day.  Copyright  VHI. All rights reserved.  Feet Apart (Compliant Surface) Head Motion - Eyes Open   With eyes open, standing on compliant surface: ___foam_____, feet shoulder width apart, move head slowly: up and down right and left, diagonals (Up-right to down-left and Up-left to down-right). Repeat __10-15__ times each direction per session. Do __1__ sessions per day.  Copyright  VHI. All rights reserved.  Feet Apart (Compliant Surface) Head Motion - Eyes Closed   Stand on compliant surface: ___foam_____ with feet shoulder width apart. Close eyes and move head slowly, up and down right and left, diagonals (Up-right to down-left and Up-left to down-right). Repeat _10-15___ times each direction per session. Do __1__ sessions per day.  Copyright  VHI. All rights reserved.  Balance: Three-Way Leg Swing   Alternate legs kicking each direction. forward __10-15__ times, sideways _10-15___ times, back __10-15__ times.    http://orth.exer.us/87   Copyright  VHI. All rights reserved.

## 2014-09-25 NOTE — Therapy (Signed)
[facility] [address] [place], [place], [address] Phone: [phone]   Fax:  [phone]  Occupational Therapy Treatment  Patient Details  Name: A[facility]ail [facility] MRN: [id number] Date of Birth: 03-27-1951 Referring Provider:  Tivis Ringer, MD  Encounter Date: 09/25/2014      OT End of Session - 09/25/14 1021    Visit Number 2   Number of Visits 17   Date for OT Re-Evaluation 11/23/14   Authorization Type [employer]   Authorization Time Period WEEK 1/8   OT Start Time 0935   OT Stop Time 1015   OT Time Calculation (min) 40 min   Activity Tolerance Patient tolerated treatment well      Past Medical History  Diagnosis Date  . CHF (congestive heart failure)   . Hypertension   . High cholesterol   . Thyroid disease   . Diabetes mellitus   . Back pain, chronic   . Renal disorder   . Obesity   . OSA on CPAP   . Edema     Past Surgical History  Procedure Laterality Date  . Nephrolithotomy      Right  . Abdominal hysterectomy    . Eye surgery    . Bunionectomy      both feet  . Left knee surgery      Torn meniscus  . US echocardiography  12/24/2011    mild LVH,mild TR,trace MR,PI  . Laparoscopic ovarian cystectomy Right 11/01/2013    Procedure: LAPAROSCOPIC RIGHT OVARIAN CYSTECTOMY WITH COLLECTION OF WASHINGS ;  Surgeon: Ena Dawley, MD;  Location: [facility];  Service: Gynecology;  Laterality: Right;  . Laparoscopic lysis of adhesions N/A 11/01/2013    Procedure: LAPAROSCOPIC LYSIS OF ADHESIONS;  Surgeon: Ena Dawley, MD;  Location: [facility];  Service: Gynecology;  Laterality: N/A;  . Laparoscopy N/A 11/01/2013    Procedure: LAPAROSCOPY DIAGNOSTIC;  Surgeon: Ena Dawley, MD;  Location: [facility];  Service: Gynecology;  Laterality: N/A;  . Tee without cardioversion N/A 05/11/2014    Procedure: TRANSESOPHAGEAL ECHOCARDIOGRAM (TEE);  Surgeon: Sanda Klein, MD;  Location: [facility];  Service:  Cardiovascular;  Laterality: N/A;  . Loop recorder implant N/A 05/11/2014    Procedure: LOOP RECORDER IMPLANT;  Surgeon: Coralyn Mark, MD;  Location: [facility];  Service: Cardiovascular;  Laterality: N/A;    There were no vitals taken for this visit.  Visit Diagnosis:  Lack of coordination due to stroke      Subjective Assessment - 09/25/14 0946    Symptoms "speech a little better"   Pertinent History CVA 03/2014, another mild CVA 04/2014 but all impairments/deficits from 1st CVA   Patient Stated Goals Get back to work   Currently in Pain? No/denies          [facility] OT Assessment - 09/25/14 0953    Coordination   Right 9 Hole Peg Test 34.97 sec.    ROM / Strength   AROM / PROM / Strength AROM;Strength  shoulder to 90% ROM. Rt grip = 45 lbs               OT Treatments/Exercises (OP) - 09/25/14 3474    ADLs   ADL Comments Pt/daughter issued CVA education and reviewed warning signs/risk factors.    Fine Motor Coordination   Other Fine Motor Exercises Pt issued coordination HEP (see education)                OT Education - 09/25/14 1012  Education provided Yes   Education Details CVA education, coordination HEP   Person(s) Educated Patient;Child(ren)   Methods Explanation;Demonstration;Handout   Comprehension Verbalized understanding;Returned demonstration          OT Short Term Goals - 09/25/14 1032    OT SHORT TERM GOAL #1   Title Independent w/ HEP for coordination and Rt shoulder (due 10/25/14)   Time 4   Period Weeks   Status On-going   OT SHORT TERM GOAL #2   Title Improve RUE functional use as evidenced by performing 38 blocks or greater on Box & Blocks test    Baseline eval = 31   Time 4   Period Weeks   Status On-going   OT SHORT TERM GOAL #3   Title Improve coordination as evidenced by performing 9 hole peg test in 30 sec. or less Rt hand   Baseline eval = 36.60 sec.   Time 4   Period Weeks   Status On-going   OT SHORT TERM GOAL  #4   Title Pt/family to verbalize understanding w/ CVA warning signs and risk factors   Time 4   Period Weeks   Status On-going   OT SHORT TERM GOAL #5   Title Pt to perform functional reaching at 115 degrees sh. flexion to retrieve and replace light objects from shelf consistently   Baseline 75% sh. flex (105-110*)   Time 4   Period Weeks   Status On-going           OT Long Term Goals - 09/25/14 1033    OT LONG TERM GOAL #1   Title Independent w/ updated strengthening HEP (DUE 11/23/14)   Time 8   Period Weeks   Status On-going   OT LONG TERM GOAL #2   Title Improve Rt hand strength to 48 lbs or greater for gripping/lifting tasks   Baseline eval = 40 lbs   Time 8   Period Weeks   Status On-going   OT LONG TERM GOAL #3   Title Pt to return to full IADLS    Baseline eval = only performing simple IADLS   Time 8   Period Weeks   Status On-going   OT LONG TERM GOAL #4   Title Pt to perform financial management tasks at 90% accuracy w/ extra time prn    Baseline eval = dependent d/t aphasia   Time 8   Period Weeks   Status On-going               Plan - 09/25/14 1036    Clinical Impression Statement Pt has not been seen by O.T. since 08/09/14 (evaluation) due to transportation issues/work schedule from family. Pt has improved slightly in RUE ROM, grip strength and coordination since last seen.    Rehab Potential Good   Clinical Impairments Affecting Rehab Potential aphasia   OT Frequency 2x / week   OT Duration 8 weeks  beginning today   OT Treatment/Interventions --  see evaluation   Plan review coordination HEP prn, Rt shoulder ROM and neuro re-education (next session issued HEP for Rt shoulder)        Problem List Patient Active Problem List   Diagnosis Date Noted  . Essential hypertension 09/18/2014  . Seizure 07/12/2014  . History of CVA with residual deficit 05/30/2014  . Seizures 05/11/2014  . CVA (cerebral infarction) 05/08/2014  . Diabetes  mellitus type 2, controlled 05/08/2014  . Hypothyroidism 05/08/2014  . Hypercholesteremia 11/29/2013  . Status post laparoscopy  11/01/2013  . OSA on CPAP 10/31/2013  . HTN (hypertension) 10/31/2013  . Morbid obesity 10/31/2013  . Leg edema 10/31/2013  . Ovarian cyst 10/31/2013  . Preoperative cardiovascular examination 10/31/2013  . Pelvic mass 10/31/2013  . DM type 2 (diabetes mellitus, type 2) 10/31/2013  . History of CHF (congestive heart failure) 10/31/2013  . Acute renal insufficiency 10/31/2013  . Unspecified hypothyroidism 10/31/2013    Carey Bullocks, OTR/L 09/25/2014, 10:42 AM  [facility] [address] [place], [place], [address] Phone: [phone]   Fax:  [phone]

## 2014-09-27 ENCOUNTER — Ambulatory Visit: Payer: BLUE CROSS/BLUE SHIELD | Admitting: Physical Therapy

## 2014-09-27 ENCOUNTER — Ambulatory Visit: Payer: BLUE CROSS/BLUE SHIELD

## 2014-09-27 ENCOUNTER — Ambulatory Visit: Payer: BLUE CROSS/BLUE SHIELD | Admitting: Occupational Therapy

## 2014-09-27 VITALS — BP 126/72 | HR 74

## 2014-09-27 DIAGNOSIS — R2689 Other abnormalities of gait and mobility: Secondary | ICD-10-CM

## 2014-09-27 DIAGNOSIS — I69359 Hemiplegia and hemiparesis following cerebral infarction affecting unspecified side: Secondary | ICD-10-CM

## 2014-09-27 DIAGNOSIS — R482 Apraxia: Secondary | ICD-10-CM

## 2014-09-27 DIAGNOSIS — R4701 Aphasia: Secondary | ICD-10-CM

## 2014-09-27 DIAGNOSIS — I69898 Other sequelae of other cerebrovascular disease: Secondary | ICD-10-CM | POA: Diagnosis not present

## 2014-09-27 DIAGNOSIS — R6889 Other general symptoms and signs: Secondary | ICD-10-CM

## 2014-09-27 DIAGNOSIS — IMO0002 Reserved for concepts with insufficient information to code with codable children: Secondary | ICD-10-CM

## 2014-09-27 NOTE — Therapy (Signed)
[facility] [address] [place], [place], [address] Phone: [phone]   Fax:  [phone]  Physical Therapy Treatment  Patient Details  Name: Abigail Johns MRN: [id number] Date of Birth: 10-06-1950 Referring Provider:  Tivis Ringer, MD  Encounter Date: 09/27/2014      PT End of Session - 09/27/14 1100    Visit Number 4   Number of Visits 9   PT Start Time 1100   PT Stop Time 1145   PT Time Calculation (min) 45 min   Equipment Utilized During Treatment Gait belt   Activity Tolerance Patient limited by pain   Behavior During Therapy [facility] for tasks assessed/performed      Past Medical History  Diagnosis Date  . CHF (congestive heart failure)   . Hypertension   . High cholesterol   . Thyroid disease   . Diabetes mellitus   . Back pain, chronic   . Renal disorder   . Obesity   . OSA on CPAP   . Edema     Past Surgical History  Procedure Laterality Date  . Nephrolithotomy      Right  . Abdominal hysterectomy    . Eye surgery    . Bunionectomy      both feet  . Left knee surgery      Torn meniscus  . US echocardiography  12/24/2011    mild LVH,mild TR,trace MR,PI  . Laparoscopic ovarian cystectomy Right 11/01/2013    Procedure: LAPAROSCOPIC RIGHT OVARIAN CYSTECTOMY WITH COLLECTION OF WASHINGS ;  Surgeon: Ena Dawley, MD;  Location: [facility];  Service: Gynecology;  Laterality: Right;  . Laparoscopic lysis of adhesions N/A 11/01/2013    Procedure: LAPAROSCOPIC LYSIS OF ADHESIONS;  Surgeon: Ena Dawley, MD;  Location: [facility];  Service: Gynecology;  Laterality: N/A;  . Laparoscopy N/A 11/01/2013    Procedure: LAPAROSCOPY DIAGNOSTIC;  Surgeon: Ena Dawley, MD;  Location: [facility];  Service: Gynecology;  Laterality: N/A;  . Tee without cardioversion N/A 05/11/2014    Procedure: TRANSESOPHAGEAL ECHOCARDIOGRAM (TEE);  Surgeon: Sanda Klein, MD;  Location: [facility];  Service: Cardiovascular;   Laterality: N/A;  . Loop recorder implant N/A 05/11/2014    Procedure: LOOP RECORDER IMPLANT;  Surgeon: Coralyn Mark, MD;  Location: [facility];  Service: Cardiovascular;  Laterality: N/A;    BP 126/72 mmHg  Pulse 74  Visit Diagnosis:  Activity intolerance  Balance problems  Weakness due to cerebrovascular accident      Subjective Assessment - 09/27/14 1119    Symptoms Having significant head ache since last Friday; today head is hurting a lot  8/10    Currently in Pain? Yes   Pain Score 8    Pain Location Head   Pain Descriptors / Indicators Aching;Headache   Pain Type Acute pain   Pain Onset In the past 7 days   Pain Relieving Factors neck relief/ posterior-anterior pressure at base of skull releieved;                     [facility] Adult PT Treatment/Exercise - 09/27/14 0001    Ambulation/Gait   Ambulation/Gait Assistance 5: Supervision   Ambulation Distance (Feet) 150 Feet   Assistive device Small based quad cane   Gait Pattern Step-through pattern   Ambulation Surface Level;Indoor                PT Education - 09/27/14 1140    Education provided Yes   Education Details technique  to help with tension headache symptoms   Person(s) Educated Patient   Methods Demonstration   Comprehension Returned demonstration     Therapeutic exercise; Instructed in tech to help with stress headache; or components of headache that may be exacerbated by tension in neck; used pillow case for self mob/snag with posterior anterior glide with pressure at base of skull;  Recumbent eliptical x 20 min; and walked 115 ' ; post tx BP at 127/70;         PT Long Term Goals - 09/11/14 1217    PT LONG TERM GOAL #1   Title demonstrates HEP correctly. (Target Date: 10/06/14)   Time 4   Period Weeks   Status On-going   PT LONG TERM GOAL #2   Title Gait Velocity >1.0 ft/sec target date 3/12/206   Time 4   Period Weeks   Status On-going   PT LONG TERM GOAL #3   Title  Berg Balance >/= 45/56 (Target Date: 10/06/14)   Time 4   Period Weeks   Status On-going   PT LONG TERM GOAL #4   Title Timed Up & Go <21 seconds (Target Date: 10/06/14)   Time 4   Period Weeks   Status On-going   PT LONG TERM GOAL #5   Title ambulates 300' & negotiates ramp/curb with single point cane modified independent. (Target Date: 10/06/14)   Time 4   Period Weeks   Status On-going               Plan - 09/27/14 1602    Clinical Impression Statement tolerated the 20 min on recumbent eliptical well; no s/s of distress; headache did not improve or worsen; no vision changes; no SOB; She did well with ambulating and transfering withing the clinc   PT Treatment/Interventions Therapeutic exercise   PT Next Visit Plan progress HEP as tolerated; follow up with c/o headache        Problem List Patient Active Problem List   Diagnosis Date Noted  . Essential hypertension 09/18/2014  . Seizure 07/12/2014  . History of CVA with residual deficit 05/30/2014  . Seizures 05/11/2014  . CVA (cerebral infarction) 05/08/2014  . Diabetes mellitus type 2, controlled 05/08/2014  . Hypothyroidism 05/08/2014  . Hypercholesteremia 11/29/2013  . Status post laparoscopy 11/01/2013  . OSA on CPAP 10/31/2013  . HTN (hypertension) 10/31/2013  . Morbid obesity 10/31/2013  . Leg edema 10/31/2013  . Ovarian cyst 10/31/2013  . Preoperative cardiovascular examination 10/31/2013  . Pelvic mass 10/31/2013  . DM type 2 (diabetes mellitus, type 2) 10/31/2013  . History of CHF (congestive heart failure) 10/31/2013  . Acute renal insufficiency 10/31/2013  . Unspecified hypothyroidism 10/31/2013    Jana Hakim 09/27/2014, 4:07 PM  [facility] [address] [place], [place], [address] Phone: [phone]   Fax:  [phone]

## 2014-09-27 NOTE — Therapy (Signed)
[facility] [address] [place], [place], [address] Phone: [phone]   Fax:  [phone]  Speech Language Pathology Treatment  Patient Details  Name: Abigail Johns MRN: [id number] Date of Birth: 1951-01-12 Referring Provider:  Tivis Ringer, MD  Encounter Date: 09/27/2014      End of Session - 09/27/14 1104    Visit Number 6   Number of Visits 16   Date for SLP Re-Evaluation 11/08/14   SLP Start Time 1016   SLP Stop Time  1101   SLP Time Calculation (min) 45 min   Activity Tolerance Patient tolerated treatment well      Past Medical History  Diagnosis Date  . CHF (congestive heart failure)   . Hypertension   . High cholesterol   . Thyroid disease   . Diabetes mellitus   . Back pain, chronic   . Renal disorder   . Obesity   . OSA on CPAP   . Edema     Past Surgical History  Procedure Laterality Date  . Nephrolithotomy      Right  . Abdominal hysterectomy    . Eye surgery    . Bunionectomy      both feet  . Left knee surgery      Torn meniscus  . US echocardiography  12/24/2011    mild LVH,mild TR,trace MR,PI  . Laparoscopic ovarian cystectomy Right 11/01/2013    Procedure: LAPAROSCOPIC RIGHT OVARIAN CYSTECTOMY WITH COLLECTION OF WASHINGS ;  Surgeon: Ena Dawley, MD;  Location: [facility];  Service: Gynecology;  Laterality: Right;  . Laparoscopic lysis of adhesions N/A 11/01/2013    Procedure: LAPAROSCOPIC LYSIS OF ADHESIONS;  Surgeon: Ena Dawley, MD;  Location: [facility];  Service: Gynecology;  Laterality: N/A;  . Laparoscopy N/A 11/01/2013    Procedure: LAPAROSCOPY DIAGNOSTIC;  Surgeon: Ena Dawley, MD;  Location: [facility];  Service: Gynecology;  Laterality: N/A;  . Tee without cardioversion N/A 05/11/2014    Procedure: TRANSESOPHAGEAL ECHOCARDIOGRAM (TEE);  Surgeon: Sanda Klein, MD;  Location: [facility];  Service: Cardiovascular;  Laterality: N/A;  . Loop recorder implant N/A  05/11/2014    Procedure: LOOP RECORDER IMPLANT;  Surgeon: Coralyn Mark, MD;  Location: [facility];  Service: Cardiovascular;  Laterality: N/A;    There were no vitals taken for this visit.  Visit Diagnosis: Apraxia  Aphasia      Subjective Assessment - 09/27/14 1025    Symptoms "I been having a pain in - - my head. About - an 8 (out of 10). SLP suggested pt call her PCP/MD to notify.             ADULT SLP TREATMENT - 09/27/14 1026    General Information   Behavior/Cognition Alert;Cooperative;Pleasant mood   Treatment Provided   Treatment provided Cognitive-Linquistic   Pain Assessment   Pain Assessment 0-10   Pain Score 8    Pain Location H/A   Pain Descriptors / Indicators Headache   Pain Intervention(s) Monitored during session   Cognitive-Linquistic Treatment   Treatment focused on Apraxia;Aphasia   Skilled Treatment Confrontation naming 90% success. Pt's awareness of errors appears improved with this task today.  Understanding of simple conversation is assessed as 90-100% of the time, by pt's resonses to SLP questions/comments. Simple conversation appears slightly better than last week with functionally getting her message out.  HEP completed with rare min A for awareness of errors.   Assessment / Recommendations / Plan   Plan Continue with  current plan of care   Progression Toward Goals   Progression toward goals Progressing toward goals            SLP Short Term Goals - 09/27/14 1106    SLP SHORT TERM GOAL #1   Title pt will complete simple naming with 85% success over three sessions (began 08-09-14)   Status Achieved   SLP SHORT TERM GOAL #2   Title pt will complete functional communication with simple wants/needs via multimodal communication with 80% success and rare min A (began 08-09-14)   Status Deferred  Pt no longer needs   SLP SHORT TERM GOAL #3   Title demo understanding of simple conversation 90% of the time (began 08-09-14)   Status Achieved    SLP SHORT TERM GOAL #4   Title demo understanding of 4-5 sentence spoken information by answering questions with 90% success and occasional min A (began 08-09-14)   Time 2   Period Weeks   Status On-going   SLP SHORT TERM GOAL #5   Title pt will complete HEP with occasional min A (began 08-09-14)   Time 2   Period Weeks   Status New          SLP Long Term Goals - 09/27/14 1107    SLP LONG TERM GOAL #1   Title pt will demo understanding of mod complex conversation with occasional min A (began 08-09-14)   Time 6   Period Weeks  or 16 visits)   Status On-going   SLP LONG TERM GOAL #2   Title pt will use multimodal communication PRN without cues (began 08-09-14)   Time 6   Period Weeks  or 16 visits   Status On-going   SLP LONG TERM GOAL #3   Title pt will complete any HEP with rare min A (began 08-09-14)   Time 6   Period Weeks  or 16 visits   Status On-going          Plan - 09/27/14 1105    Clinical Impression Statement Apraxia and aphasaia persist. Pt reports speech is decr'd, however this SLP thinks conversational speech is commensurate or slightly better than last week.   Speech Therapy Frequency 2x / week   Duration --  6 weeks   Treatment/Interventions Language facilitation;Cueing hierarchy;Multimodal communcation approach;Functional tasks;Patient/family education;Compensatory strategies;Internal/external aids;SLP instruction and feedback   Potential Considerations Severity of impairments        Problem List Patient Active Problem List   Diagnosis Date Noted  . Essential hypertension 09/18/2014  . Seizure 07/12/2014  . History of CVA with residual deficit 05/30/2014  . Seizures 05/11/2014  . CVA (cerebral infarction) 05/08/2014  . Diabetes mellitus type 2, controlled 05/08/2014  . Hypothyroidism 05/08/2014  . Hypercholesteremia 11/29/2013  . Status post laparoscopy 11/01/2013  . OSA on CPAP 10/31/2013  . HTN (hypertension) 10/31/2013  . Morbid obesity  10/31/2013  . Leg edema 10/31/2013  . Ovarian cyst 10/31/2013  . Preoperative cardiovascular examination 10/31/2013  . Pelvic mass 10/31/2013  . DM type 2 (diabetes mellitus, type 2) 10/31/2013  . History of CHF (congestive heart failure) 10/31/2013  . Acute renal insufficiency 10/31/2013  . Unspecified hypothyroidism 10/31/2013    Garald Balding, SLP 09/27/2014, 11:08 AM  [facility] [address] [place], [place], [address] Phone: [phone]   Fax:  [phone]

## 2014-09-27 NOTE — Patient Instructions (Signed)
  Complete 20 words twice a day, with someone to help you if needed

## 2014-09-27 NOTE — Therapy (Signed)
[facility] [address] [place], [place], [address] Phone: [phone]   Fax:  [phone]  Occupational Therapy Treatment  Patient Details  Name: Abigail Johns MRN: [id number] Date of Birth: 1951-05-24 Referring Provider:  Tivis Ringer, MD  Encounter Date: 09/27/2014      OT End of Session - 09/27/14 1011    Visit Number 3   Number of Visits 17   Date for OT Re-Evaluation 11/23/14   Authorization Type [employer]   Authorization Time Period WEEK 1/8   OT Start Time 0920   OT Stop Time 1010   OT Time Calculation (min) 50 min   Activity Tolerance Patient tolerated treatment well      Past Medical History  Diagnosis Date  . CHF (congestive heart failure)   . Hypertension   . High cholesterol   . Thyroid disease   . Diabetes mellitus   . Back pain, chronic   . Renal disorder   . Obesity   . OSA on CPAP   . Edema     Past Surgical History  Procedure Laterality Date  . Nephrolithotomy      Right  . Abdominal hysterectomy    . Eye surgery    . Bunionectomy      both feet  . Left knee surgery      Torn meniscus  . US echocardiography  12/24/2011    mild LVH,mild TR,trace MR,PI  . Laparoscopic ovarian cystectomy Right 11/01/2013    Procedure: LAPAROSCOPIC RIGHT OVARIAN CYSTECTOMY WITH COLLECTION OF WASHINGS ;  Surgeon: Ena Dawley, MD;  Location: [facility];  Service: Gynecology;  Laterality: Right;  . Laparoscopic lysis of adhesions N/A 11/01/2013    Procedure: LAPAROSCOPIC LYSIS OF ADHESIONS;  Surgeon: Ena Dawley, MD;  Location: [facility];  Service: Gynecology;  Laterality: N/A;  . Laparoscopy N/A 11/01/2013    Procedure: LAPAROSCOPY DIAGNOSTIC;  Surgeon: Ena Dawley, MD;  Location: [facility];  Service: Gynecology;  Laterality: N/A;  . Tee without cardioversion N/A 05/11/2014    Procedure: TRANSESOPHAGEAL ECHOCARDIOGRAM (TEE);  Surgeon: Sanda Klein, MD;  Location: [facility];  Service:  Cardiovascular;  Laterality: N/A;  . Loop recorder implant N/A 05/11/2014    Procedure: LOOP RECORDER IMPLANT;  Surgeon: Coralyn Mark, MD;  Location: [facility];  Service: Cardiovascular;  Laterality: N/A;    There were no vitals taken for this visit.  Visit Diagnosis:  Hemiparesis affecting dominant side as late effect of cerebrovascular accident      Subjective Assessment - 09/27/14 0922    Pertinent History CVA 03/2014, another mild CVA 04/2014 but all impairments/deficits from 1st CVA   Patient Stated Goals Get back to work   Currently in Pain? Other (Comment)  headache 8/10 since yesterday afternoon. O.T. not addressing secondary to outside scope of practice. Pt does not appear to be in any distress                 OT Treatments/Exercises (OP) - 09/27/14 1683    Neurological Re-education Exercises   Scapular Stabilization Prone  retraction w/ mod tactile cues/facilitation for Rt scapula   Shoulder Flexion AAROM  with cane (seated and supine) for control and retraining RUE   Shoulder ABduction AAROM  with cane seated for RUE retraining   Shoulder External Rotation AAROM  standing with cane with shoulder extension for posterior sh.   Other Exercises 1 Supine: AA/ROM in shoulder flexion bilaterally with ball for proper alignment, control and retraining RUE. Pt  w/ occasional min cues required for elbow extension and proper alignment of shoulder. Progressed to AA/ROM RUE only in shoulder flexion, then abduction/scaption using UE Ranger w/ min facilitation and cues for correct movement and elbow extension   Other Exercises 2 Closed chain body on arm movements maintaining RUE at 100* sh. flexion with occasional cues to push for elbow extension. Then performed closed chain sh. flexion/extension high range sh. flexion (against therapists hands)   Weight Bearing Position --  Prone on elbows for downward depression of scapulas     Also verbally reviewed coordination HEP  previously issued. Pt had no further questions. (Pt's aunt present for treatment today)             OT Short Term Goals - 09/25/14 1032    OT SHORT TERM GOAL #1   Title Independent w/ HEP for coordination and Rt shoulder (due 10/25/14)   Time 4   Period Weeks   Status On-going   OT SHORT TERM GOAL #2   Title Improve RUE functional use as evidenced by performing 38 blocks or greater on Box & Blocks test    Baseline eval = 31   Time 4   Period Weeks   Status On-going   OT SHORT TERM GOAL #3   Title Improve coordination as evidenced by performing 9 hole peg test in 30 sec. or less Rt hand   Baseline eval = 36.60 sec.   Time 4   Period Weeks   Status On-going   OT SHORT TERM GOAL #4   Title Pt/family to verbalize understanding w/ CVA warning signs and risk factors   Time 4   Period Weeks   Status On-going   OT SHORT TERM GOAL #5   Title Pt to perform functional reaching at 115 degrees sh. flexion to retrieve and replace light objects from shelf consistently   Baseline 75% sh. flex (105-110*)   Time 4   Period Weeks   Status On-going           OT Long Term Goals - 09/25/14 1033    OT LONG TERM GOAL #1   Title Independent w/ updated strengthening HEP (DUE 11/23/14)   Time 8   Period Weeks   Status On-going   OT LONG TERM GOAL #2   Title Improve Rt hand strength to 48 lbs or greater for gripping/lifting tasks   Baseline eval = 40 lbs   Time 8   Period Weeks   Status On-going   OT LONG TERM GOAL #3   Title Pt to return to full IADLS    Baseline eval = only performing simple IADLS   Time 8   Period Weeks   Status On-going   OT LONG TERM GOAL #4   Title Pt to perform financial management tasks at 90% accuracy w/ extra time prn    Baseline eval = dependent d/t aphasia   Time 8   Period Weeks   Status On-going               Plan - 09/27/14 1011    Clinical Impression Statement Pt is tolerating high range shoulder flexion in AA/ROM closed chain  activities with no pain Rt shoulder. Pt demo increased control RUE with occasional cueing for positioning   Plan issue cane ex's (performed today) for HEP. Continue Rt hand coordination and RUE functional use (open chain reaching midrange)        Problem List Patient Active Problem List   Diagnosis Date Noted  .  Essential hypertension 09/18/2014  . Seizure 07/12/2014  . History of CVA with residual deficit 05/30/2014  . Seizures 05/11/2014  . CVA (cerebral infarction) 05/08/2014  . Diabetes mellitus type 2, controlled 05/08/2014  . Hypothyroidism 05/08/2014  . Hypercholesteremia 11/29/2013  . Status post laparoscopy 11/01/2013  . OSA on CPAP 10/31/2013  . HTN (hypertension) 10/31/2013  . Morbid obesity 10/31/2013  . Leg edema 10/31/2013  . Ovarian cyst 10/31/2013  . Preoperative cardiovascular examination 10/31/2013  . Pelvic mass 10/31/2013  . DM type 2 (diabetes mellitus, type 2) 10/31/2013  . History of CHF (congestive heart failure) 10/31/2013  . Acute renal insufficiency 10/31/2013  . Unspecified hypothyroidism 10/31/2013    Carey Bullocks, OTR/L 09/27/2014, 10:16 AM  [facility] [address] [place], [place], [address] Phone: [phone]   Fax:  [phone]

## 2014-09-27 NOTE — Patient Instructions (Signed)
Recommended using pillow case for gentle posterior anterior pressure at base of neck to help with headache/stress symptoms and at home to use ice on her neck to hlep with tension headaches Discussed BP reading; WNL

## 2014-10-02 ENCOUNTER — Encounter: Payer: Self-pay | Admitting: Occupational Therapy

## 2014-10-02 ENCOUNTER — Ambulatory Visit: Payer: BLUE CROSS/BLUE SHIELD

## 2014-10-02 ENCOUNTER — Encounter: Payer: Self-pay | Admitting: Physical Therapy

## 2014-10-02 ENCOUNTER — Ambulatory Visit: Payer: BLUE CROSS/BLUE SHIELD | Admitting: Physical Therapy

## 2014-10-02 ENCOUNTER — Ambulatory Visit: Payer: BLUE CROSS/BLUE SHIELD | Admitting: Occupational Therapy

## 2014-10-02 DIAGNOSIS — I69898 Other sequelae of other cerebrovascular disease: Secondary | ICD-10-CM | POA: Diagnosis not present

## 2014-10-02 DIAGNOSIS — I69359 Hemiplegia and hemiparesis following cerebral infarction affecting unspecified side: Secondary | ICD-10-CM

## 2014-10-02 DIAGNOSIS — IMO0002 Reserved for concepts with insufficient information to code with codable children: Secondary | ICD-10-CM

## 2014-10-02 DIAGNOSIS — R2689 Other abnormalities of gait and mobility: Secondary | ICD-10-CM

## 2014-10-02 DIAGNOSIS — R269 Unspecified abnormalities of gait and mobility: Secondary | ICD-10-CM

## 2014-10-02 DIAGNOSIS — R4701 Aphasia: Secondary | ICD-10-CM

## 2014-10-02 DIAGNOSIS — R6889 Other general symptoms and signs: Secondary | ICD-10-CM

## 2014-10-02 DIAGNOSIS — R482 Apraxia: Secondary | ICD-10-CM

## 2014-10-02 NOTE — Therapy (Signed)
[facility] [address] [place], [place], [address] Phone: [phone]   Fax:  [phone]  Physical Therapy Treatment  Patient Details  Name: Abigail Johns MRN: [id number] Date of Birth: Aug 23, 1950 Referring Provider:  Prince Solian, MD  Encounter Date: 10/02/2014      PT End of Session - 10/02/14 1238    Visit Number 5   Number of Visits 9   PT Start Time 1015   PT Stop Time 1100   PT Time Calculation (min) 45 min   Equipment Utilized During Treatment Gait belt   Activity Tolerance Patient limited by pain   Behavior During Therapy [facility] for tasks assessed/performed      Past Medical History  Diagnosis Date  . CHF (congestive heart failure)   . Hypertension   . High cholesterol   . Thyroid disease   . Diabetes mellitus   . Back pain, chronic   . Renal disorder   . Obesity   . OSA on CPAP   . Edema     Past Surgical History  Procedure Laterality Date  . Nephrolithotomy      Right  . Abdominal hysterectomy    . Eye surgery    . Bunionectomy      both feet  . Left knee surgery      Torn meniscus  . US echocardiography  12/24/2011    mild LVH,mild TR,trace MR,PI  . Laparoscopic ovarian cystectomy Right 11/01/2013    Procedure: LAPAROSCOPIC RIGHT OVARIAN CYSTECTOMY WITH COLLECTION OF WASHINGS ;  Surgeon: Ena Dawley, MD;  Location: [facility];  Service: Gynecology;  Laterality: Right;  . Laparoscopic lysis of adhesions N/A 11/01/2013    Procedure: LAPAROSCOPIC LYSIS OF ADHESIONS;  Surgeon: Ena Dawley, MD;  Location: [facility];  Service: Gynecology;  Laterality: N/A;  . Laparoscopy N/A 11/01/2013    Procedure: LAPAROSCOPY DIAGNOSTIC;  Surgeon: Ena Dawley, MD;  Location: [facility];  Service: Gynecology;  Laterality: N/A;  . Tee without cardioversion N/A 05/11/2014    Procedure: TRANSESOPHAGEAL ECHOCARDIOGRAM (TEE);  Surgeon: Sanda Klein, MD;  Location: [facility];  Service: Cardiovascular;   Laterality: N/A;  . Loop recorder implant N/A 05/11/2014    Procedure: LOOP RECORDER IMPLANT;  Surgeon: Coralyn Mark, MD;  Location: [facility];  Service: Cardiovascular;  Laterality: N/A;    There were no vitals taken for this visit.  Visit Diagnosis:  Activity intolerance  Balance problems  Weakness due to cerebrovascular accident  Abnormality of gait      Subjective Assessment - 10/02/14 1237    Pain Score 0-No pain          [facility] PT Assessment - 10/02/14 1027    Ambulation/Gait   Gait velocity 1.12 ft/sec   Berg Balance Test   Sit to Stand Able to stand without using hands and stabilize independently   Standing Unsupported Able to stand safely 2 minutes   Sitting with Back Unsupported but Feet Supported on Floor or Stool Able to sit safely and securely 2 minutes   Stand to Sit Sits safely with minimal use of hands   Transfers Able to transfer safely, minor use of hands   Standing Unsupported with Eyes Closed Able to stand 10 seconds safely   Standing Ubsupported with Feet Together Able to place feet together independently and stand 1 minute safely   From Standing, Reach Forward with Outstretched Arm Can reach confidently >25 cm (10")   From Standing Position, Pick up Object from Floor Able  to pick up shoe safely and easily   From Standing Position, Turn to Look Behind Over each Shoulder Looks behind one side only/other side shows less weight shift   Turn 360 Degrees Needs close supervision or verbal cueing   Standing Unsupported, Alternately Place Feet on Step/Stool Able to complete >2 steps/needs minimal assist   Standing Unsupported, One Foot in Front Needs help to step but can hold 15 seconds   Standing on One Leg Tries to lift leg/unable to hold 3 seconds but remains standing independently   Total Score 43   Timed Up and Go Test   Normal TUG (seconds) 22.55                          PT Education - 10/02/14 1237    Education provided Yes    Education Details results of testing compared to initial and new goals   Person(s) Educated Patient;Child(ren)   Methods Explanation   Comprehension Verbalized understanding          PT Short Term Goals - 10/02/14 1252    PT SHORT TERM GOAL #1   Title demonstrates understanding of updated HEP (Target Date: 11/02/14)   Time 30   Period Days   Status New   PT SHORT TERM GOAL #2   Title Gait Velocity >1.4 ft/sec with single point cane (Target Date: 11/02/14)   Time 30   Period Days   Status New   PT SHORT TERM GOAL #3   Title Berg Balance > 45/56 (Target Date: 11/02/14)   Time 30   Period Days   Status New   PT SHORT TERM GOAL #4   Title Timed Up & Go <20sec with single point cane (Target Date: 11/02/14)   Time 30   Period Days   Status New   PT SHORT TERM GOAL #5   Title ambulates 300' outside including some grass surfaces with single point cane and negotiates ramp /curb with supervision. (Target Date: 11/02/14)   Time 30   Period Days   Status New           PT Long Term Goals - 10/02/14 1242    PT LONG TERM GOAL #1   Title demonstrates HEP correctly. (NEW Target Date: 11/30/14)   Baseline partially met 10/02/14 Patient understands current HEP but needs to be progressed.   Time 8   Period Weeks   Status On-going   PT LONG TERM GOAL #2   Title Gait Velocity >1.0 ft/sec target date 3/12/206   Baseline MET 10/02/14 at 1.12 ft/sec   Time 4   Period Weeks   Status Achieved   PT LONG TERM GOAL #3   Title Berg Balance >/= 50/56 (New Target Date: 11/30/14)   Baseline 10/02/14 improved but not met Merrilee Jansky 43/56 (initial was 37/56)   Time 8   Period Weeks   Status Revised   PT LONG TERM GOAL #4   Title Timed Up & Go <18 seconds (New Target Date: 11/30/14)   Baseline 10/01/14 - improved but not met TUG 22.55 sec with single point cane   Time 8   Period Weeks   Status Revised   PT LONG TERM GOAL #5   Title ambulates 300' & negotiates ramp/curb with single point cane modified independent.  (New Target Date: 11/30/14)   Baseline 10/02/14 improved but not met - patient ambulates 300' with single point cane with supervision on level surfaces and min A on ramp /  curb.   Time 8   Period Weeks   Status On-going   Additional Long Term Goals   Additional Long Term Goals Yes   PT LONG TERM GOAL #6   Title Gait Velocity >1.8 ft/sec (New Target Date: 11/30/14)   Time 8   Period Weeks   Status New               Plan - 10/02/14 1238    Clinical Impression Statement Patient improved Gait Velocity to 1.12 ft/sec (initial was 0.87 ft/sec), Berg Balance 43/56 (initial was 37/56) and Timed Up-Go to 22.55 sec (Initial was 29.33sec) She has potential to progress further with skilled care. See updated goals.   Pt will benefit from skilled therapeutic intervention in order to improve on the following deficits Abnormal gait;Decreased activity tolerance;Decreased balance;Decreased endurance;Decreased mobility;Decreased range of motion;Decreased strength   Rehab Potential Good   PT Frequency 2x / week   PT Duration 8 weeks   PT Treatment/Interventions Therapeutic exercise;ADLs/Self Care Home Management;DME Instruction;Gait training;Stair training;Functional mobility training;Therapeutic activities;Balance training;Neuromuscular re-education;Patient/family education   PT Next Visit Plan gait with single point cane on barriers & grass, work on head turns with gait, balance & strength   Consulted and Agree with Plan of Care Patient;Family member/caregiver   Family Member Consulted daughter        Problem List Patient Active Problem List   Diagnosis Date Noted  . Essential hypertension 09/18/2014  . Seizure 07/12/2014  . History of CVA with residual deficit 05/30/2014  . Seizures 05/11/2014  . CVA (cerebral infarction) 05/08/2014  . Diabetes mellitus type 2, controlled 05/08/2014  . Hypothyroidism 05/08/2014  . Hypercholesteremia 11/29/2013  . Status post laparoscopy 11/01/2013  . OSA on  CPAP 10/31/2013  . HTN (hypertension) 10/31/2013  . Morbid obesity 10/31/2013  . Leg edema 10/31/2013  . Ovarian cyst 10/31/2013  . Preoperative cardiovascular examination 10/31/2013  . Pelvic mass 10/31/2013  . DM type 2 (diabetes mellitus, type 2) 10/31/2013  . History of CHF (congestive heart failure) 10/31/2013  . Acute renal insufficiency 10/31/2013  . Unspecified hypothyroidism 10/31/2013    Karna Abed PT, DPT 10/02/2014, 12:55 PM  [facility] [address] [place], [place], [address] Phone: [phone]   Fax:  [phone]

## 2014-10-02 NOTE — Therapy (Signed)
[facility] [address] [place], [place], [address] Phone: [phone]   Fax:  [phone]  Speech Language Pathology Treatment  Patient Details  Name: Abigail Johns MRN: [id number] Date of Birth: 03/16/1951 Referring Provider:  Prince Solian, MD  Encounter Date: 10/02/2014      End of Session - 10/02/14 1141    Visit Number 7   Number of Visits 16   Date for SLP Re-Evaluation 11/08/14   SLP Start Time 26   SLP Stop Time  1145   SLP Time Calculation (min) 40 min   Activity Tolerance Patient tolerated treatment well      Past Medical History  Diagnosis Date  . CHF (congestive heart failure)   . Hypertension   . High cholesterol   . Thyroid disease   . Diabetes mellitus   . Back pain, chronic   . Renal disorder   . Obesity   . OSA on CPAP   . Edema     Past Surgical History  Procedure Laterality Date  . Nephrolithotomy      Right  . Abdominal hysterectomy    . Eye surgery    . Bunionectomy      both feet  . Left knee surgery      Torn meniscus  . US echocardiography  12/24/2011    mild LVH,mild TR,trace MR,PI  . Laparoscopic ovarian cystectomy Right 11/01/2013    Procedure: LAPAROSCOPIC RIGHT OVARIAN CYSTECTOMY WITH COLLECTION OF WASHINGS ;  Surgeon: Ena Dawley, MD;  Location: [facility];  Service: Gynecology;  Laterality: Right;  . Laparoscopic lysis of adhesions N/A 11/01/2013    Procedure: LAPAROSCOPIC LYSIS OF ADHESIONS;  Surgeon: Ena Dawley, MD;  Location: [facility];  Service: Gynecology;  Laterality: N/A;  . Laparoscopy N/A 11/01/2013    Procedure: LAPAROSCOPY DIAGNOSTIC;  Surgeon: Ena Dawley, MD;  Location: [facility];  Service: Gynecology;  Laterality: N/A;  . Tee without cardioversion N/A 05/11/2014    Procedure: TRANSESOPHAGEAL ECHOCARDIOGRAM (TEE);  Surgeon: Sanda Klein, MD;  Location: [facility];  Service: Cardiovascular;  Laterality: N/A;  . Loop recorder implant N/A  05/11/2014    Procedure: LOOP RECORDER IMPLANT;  Surgeon: Coralyn Mark, MD;  Location: [facility];  Service: Cardiovascular;  Laterality: N/A;    There were no vitals taken for this visit.  Visit Diagnosis: Apraxia  Aphasia      Subjective Assessment - 10/02/14 1107    Symptoms Daughter reports pt's speech has definitely improved since Valentine's Day.              ADULT SLP TREATMENT - 10/02/14 1110    General Information   Behavior/Cognition Alert;Cooperative;Pleasant mood   Treatment Provided   Treatment provided Cognitive-Linquistic   Pain Assessment   Pain Assessment 0-10   Pain Score 3    Pain Location H/A   Pain Descriptors / Indicators Headache   Pain Intervention(s) Monitored during session   Cognitive-Linquistic Treatment   Treatment focused on Apraxia;Aphasia   Skilled Treatment "They seem to -hurt, -hurt -not the right word- cause of the calls"  Simple to mod complex conversation about therapy tasks today was non-functional. Pt unable to self-correct, although error awareness improved. SLP read 5-6 sentence paragraphs and assessed pt's understanding - pt with better success with paragraphs with less numerical information in therm.    Assessment / Recommendations / Plan   Plan Continue with current plan of care   Progression Toward Goals   Progression toward goals Progressing toward  goals          SLP Education - 10/02/14 1141    Education provided Yes   Education Details how best to help pt with auditory comprehension (paragraph) info   Person(s) Educated Patient;Child(ren)   Methods Explanation;Demonstration   Comprehension Verbalized understanding          SLP Short Term Goals - 10/02/14 1144    SLP SHORT TERM GOAL #1   Title pt will complete simple naming with 85% success over three sessions (began 08-09-14)   Status Achieved   SLP SHORT TERM GOAL #2   Title pt will complete functional communication with simple wants/needs via multimodal  communication with 80% success and rare min A (began 08-09-14)   Status Deferred  Pt no longer needs   SLP SHORT TERM GOAL #3   Title demo understanding of simple conversation 90% of the time (began 08-09-14)   Status Achieved   SLP SHORT TERM GOAL #4   Title demo understanding of 4-5 sentence spoken information by answering questions with 90% success and occasional min A (began 08-09-14)   Time 1   Period Weeks   Status On-going   SLP SHORT TERM GOAL #5   Title pt will complete HEP with occasional min A (began 08-09-14)   Time 1   Period Weeks   Status New          SLP Long Term Goals - 10/02/14 1144    SLP LONG TERM GOAL #1   Title pt will demo understanding of mod complex conversation with occasional min A (began 08-09-14)   Time 5   Period Weeks  or 16 visits)   Status On-going   SLP LONG TERM GOAL #2   Title pt will use multimodal communication PRN without cues (began 08-09-14)   Time 5   Period Weeks  or 16 visits   Status On-going   SLP LONG TERM GOAL #3   Title pt will complete any HEP with rare min A (began 08-09-14)   Time 5   Period Weeks  or 16 visits   Status On-going          Plan - 10/02/14 1142    Clinical Impression Statement Apraxia and aphasaia persist. Pt with decr'd comprehension of longer auditoy stimuli.   Speech Therapy Frequency 2x / week   Duration --  5 weeks   Treatment/Interventions Language facilitation;Cueing hierarchy;Multimodal communcation approach;Functional tasks;Patient/family education;Compensatory strategies;Internal/external aids;SLP instruction and feedback   Potential to Achieve Goals Good   Potential Considerations Severity of impairments        Problem List Patient Active Problem List   Diagnosis Date Noted  . Essential hypertension 09/18/2014  . Seizure 07/12/2014  . History of CVA with residual deficit 05/30/2014  . Seizures 05/11/2014  . CVA (cerebral infarction) 05/08/2014  . Diabetes mellitus type 2,  controlled 05/08/2014  . Hypothyroidism 05/08/2014  . Hypercholesteremia 11/29/2013  . Status post laparoscopy 11/01/2013  . OSA on CPAP 10/31/2013  . HTN (hypertension) 10/31/2013  . Morbid obesity 10/31/2013  . Leg edema 10/31/2013  . Ovarian cyst 10/31/2013  . Preoperative cardiovascular examination 10/31/2013  . Pelvic mass 10/31/2013  . DM type 2 (diabetes mellitus, type 2) 10/31/2013  . History of CHF (congestive heart failure) 10/31/2013  . Acute renal insufficiency 10/31/2013  . Unspecified hypothyroidism 10/31/2013    [facility], SLP 10/02/2014, 11:45 AM  [facility] [address] [place], [place], [address] Phone: [phone]   Fax:  [phone]     

## 2014-10-02 NOTE — Patient Instructions (Signed)
  Please complete the assigned speech therapy homework before your next session.

## 2014-10-02 NOTE — Therapy (Signed)
[facility] [address] [place], [place], [address] Phone: [phone]   Fax:  [phone]  Occupational Therapy Treatment  Patient Details  Name: Abigail Johns MRN: [id number] Date of Birth: Jan 02, 1951 Referring Provider:  Prince Solian, MD  Encounter Date: 10/02/2014      OT End of Session - 10/02/14 1312    Visit Number 4   Number of Visits 17   Date for OT Re-Evaluation 11/23/14   Authorization Type [employer]   Authorization Time Period WEEK 2/8   OT Start Time 0930   OT Stop Time 1015   OT Time Calculation (min) 45 min   Activity Tolerance Patient tolerated treatment well      Past Medical History  Diagnosis Date  . CHF (congestive heart failure)   . Hypertension   . High cholesterol   . Thyroid disease   . Diabetes mellitus   . Back pain, chronic   . Renal disorder   . Obesity   . OSA on CPAP   . Edema     Past Surgical History  Procedure Laterality Date  . Nephrolithotomy      Right  . Abdominal hysterectomy    . Eye surgery    . Bunionectomy      both feet  . Left knee surgery      Torn meniscus  . US echocardiography  12/24/2011    mild LVH,mild TR,trace MR,PI  . Laparoscopic ovarian cystectomy Right 11/01/2013    Procedure: LAPAROSCOPIC RIGHT OVARIAN CYSTECTOMY WITH COLLECTION OF WASHINGS ;  Surgeon: Ena Dawley, MD;  Location: [facility];  Service: Gynecology;  Laterality: Right;  . Laparoscopic lysis of adhesions N/A 11/01/2013    Procedure: LAPAROSCOPIC LYSIS OF ADHESIONS;  Surgeon: Ena Dawley, MD;  Location: [facility];  Service: Gynecology;  Laterality: N/A;  . Laparoscopy N/A 11/01/2013    Procedure: LAPAROSCOPY DIAGNOSTIC;  Surgeon: Ena Dawley, MD;  Location: [facility];  Service: Gynecology;  Laterality: N/A;  . Tee without cardioversion N/A 05/11/2014    Procedure: TRANSESOPHAGEAL ECHOCARDIOGRAM (TEE);  Surgeon: Sanda Klein, MD;  Location: [facility];  Service:  Cardiovascular;  Laterality: N/A;  . Loop recorder implant N/A 05/11/2014    Procedure: LOOP RECORDER IMPLANT;  Surgeon: Coralyn Mark, MD;  Location: [facility];  Service: Cardiovascular;  Laterality: N/A;    There were no vitals taken for this visit.  Visit Diagnosis:  Hemiparesis affecting dominant side as late effect of cerebrovascular accident  Lack of coordination due to stroke      Subjective Assessment - 10/02/14 0936    Pertinent History CVA 03/2014, another mild CVA 04/2014 but all impairments/deficits from 1st CVA   Patient Stated Goals Get back to work   Currently in Pain? No/denies                 OT Treatments/Exercises (OP) - 10/02/14 4580    Fine Motor Coordination   Fine Motor Coordination Small Pegboard   Small Pegboard Pt copying peg design with small pegs and manipulating up to 5 pegs at a time Rt hand with min drops and difficulty when translating pegs from palm to fingertips. (Pt copied at 100% accuracy)   Other Fine Motor Exercises Pt translating stress balls w/ min to mod difficulty and initially mod drops however improved with repetition. Pt practiced writing name with approx. 90-95% legibility with extra time.    Neurological Re-education Exercises   Scapular Stabilization Right  standing   Other Exercises 1  Standing: AA/ROM bilaterally with physioball performing shoulder flexion along wall, followed by scapula stabalization with scapula retraction to perform full flexion off ball at high range alternating UE's w/ min assist RUE.    Other Exercises 2 Prone over physioball (in standing) for scapula stabalization/strengthening during retraction with shoulder horizontal abduction x 10, then progressed to full flexion with min to mod assist RUE.    Other Weight-Bearing Exercises 1 Prone with upper body over EOB for AA/ROM sliding ball out in full flexion bilaterally then pulling back towards body with arms straight to increase wt bearing with min  difficulty/control                  OT Short Term Goals - 09/25/14 1032    OT SHORT TERM GOAL #1   Title Independent w/ HEP for coordination and Rt shoulder (due 10/25/14)   Time 4   Period Weeks   Status On-going   OT SHORT TERM GOAL #2   Title Improve RUE functional use as evidenced by performing 38 blocks or greater on Box & Blocks test    Baseline eval = 31   Time 4   Period Weeks   Status On-going   OT SHORT TERM GOAL #3   Title Improve coordination as evidenced by performing 9 hole peg test in 30 sec. or less Rt hand   Baseline eval = 36.60 sec.   Time 4   Period Weeks   Status On-going   OT SHORT TERM GOAL #4   Title Pt/family to verbalize understanding w/ CVA warning signs and risk factors   Time 4   Period Weeks   Status On-going   OT SHORT TERM GOAL #5   Title Pt to perform functional reaching at 115 degrees sh. flexion to retrieve and replace light objects from shelf consistently   Baseline 75% sh. flex (105-110*)   Time 4   Period Weeks   Status On-going           OT Long Term Goals - 09/25/14 1033    OT LONG TERM GOAL #1   Title Independent w/ updated strengthening HEP (DUE 11/23/14)   Time 8   Period Weeks   Status On-going   OT LONG TERM GOAL #2   Title Improve Rt hand strength to 48 lbs or greater for gripping/lifting tasks   Baseline eval = 40 lbs   Time 8   Period Weeks   Status On-going   OT LONG TERM GOAL #3   Title Pt to return to full IADLS    Baseline eval = only performing simple IADLS   Time 8   Period Weeks   Status On-going   OT LONG TERM GOAL #4   Title Pt to perform financial management tasks at 90% accuracy w/ extra time prn    Baseline eval = dependent d/t aphasia   Time 8   Period Weeks   Status On-going               Plan - 10/02/14 1313    Clinical Impression Statement Pt progressing shoulder ROM and control. Pt also with improved coordination, however still w/ mild deficits and difficulty with in hand  manipulation tasks.    Plan continue high range neuro re-educ. and posterior sh. girdle strengthening, mid to high level open chain coordination task   Consulted and Agree with Plan of Care Patient        Problem List Patient Active Problem List   Diagnosis Date  Noted  . Essential hypertension 09/18/2014  . Seizure 07/12/2014  . History of CVA with residual deficit 05/30/2014  . Seizures 05/11/2014  . CVA (cerebral infarction) 05/08/2014  . Diabetes mellitus type 2, controlled 05/08/2014  . Hypothyroidism 05/08/2014  . Hypercholesteremia 11/29/2013  . Status post laparoscopy 11/01/2013  . OSA on CPAP 10/31/2013  . HTN (hypertension) 10/31/2013  . Morbid obesity 10/31/2013  . Leg edema 10/31/2013  . Ovarian cyst 10/31/2013  . Preoperative cardiovascular examination 10/31/2013  . Pelvic mass 10/31/2013  . DM type 2 (diabetes mellitus, type 2) 10/31/2013  . History of CHF (congestive heart failure) 10/31/2013  . Acute renal insufficiency 10/31/2013  . Unspecified hypothyroidism 10/31/2013    Carey Bullocks, OTR/L 10/02/2014, 1:15 PM  [facility] [address] [place], [place], [address] Phone: [phone]   Fax:  [phone]

## 2014-10-04 ENCOUNTER — Ambulatory Visit: Payer: BLUE CROSS/BLUE SHIELD | Admitting: Physical Therapy

## 2014-10-04 ENCOUNTER — Encounter: Payer: Self-pay | Admitting: Physical Therapy

## 2014-10-04 ENCOUNTER — Ambulatory Visit: Payer: BLUE CROSS/BLUE SHIELD

## 2014-10-04 ENCOUNTER — Encounter: Payer: BLUE CROSS/BLUE SHIELD | Admitting: Occupational Therapy

## 2014-10-04 DIAGNOSIS — I69898 Other sequelae of other cerebrovascular disease: Secondary | ICD-10-CM | POA: Diagnosis not present

## 2014-10-04 DIAGNOSIS — IMO0002 Reserved for concepts with insufficient information to code with codable children: Secondary | ICD-10-CM

## 2014-10-04 DIAGNOSIS — R4701 Aphasia: Secondary | ICD-10-CM

## 2014-10-04 DIAGNOSIS — R2689 Other abnormalities of gait and mobility: Secondary | ICD-10-CM

## 2014-10-04 DIAGNOSIS — R482 Apraxia: Secondary | ICD-10-CM

## 2014-10-04 DIAGNOSIS — R269 Unspecified abnormalities of gait and mobility: Secondary | ICD-10-CM

## 2014-10-04 DIAGNOSIS — R6889 Other general symptoms and signs: Secondary | ICD-10-CM

## 2014-10-04 NOTE — Therapy (Signed)
[facility] [address] [place], [place], [address] Phone: [phone]   Fax:  [phone]  Physical Therapy Treatment  Patient Details  Name: Abigail Johns MRN: [id number] Date of Birth: 18-Sep-1950 Referring Provider:  Prince Solian, MD  Encounter Date: 10/04/2014      PT End of Session - 10/04/14 1530    Visit Number 6   Number of Visits 25   Date for PT Re-Evaluation 11/30/14   PT Start Time 3570   PT Stop Time 1530   PT Time Calculation (min) 45 min   Equipment Utilized During Treatment Gait belt   Activity Tolerance Patient limited by pain   Behavior During Therapy [facility] for tasks assessed/performed      Past Medical History  Diagnosis Date  . CHF (congestive heart failure)   . Hypertension   . High cholesterol   . Thyroid disease   . Diabetes mellitus   . Back pain, chronic   . Renal disorder   . Obesity   . OSA on CPAP   . Edema     Past Surgical History  Procedure Laterality Date  . Nephrolithotomy      Right  . Abdominal hysterectomy    . Eye surgery    . Bunionectomy      both feet  . Left knee surgery      Torn meniscus  . US echocardiography  12/24/2011    mild LVH,mild TR,trace MR,PI  . Laparoscopic ovarian cystectomy Right 11/01/2013    Procedure: LAPAROSCOPIC RIGHT OVARIAN CYSTECTOMY WITH COLLECTION OF WASHINGS ;  Surgeon: Ena Dawley, MD;  Location: [facility];  Service: Gynecology;  Laterality: Right;  . Laparoscopic lysis of adhesions N/A 11/01/2013    Procedure: LAPAROSCOPIC LYSIS OF ADHESIONS;  Surgeon: Ena Dawley, MD;  Location: [facility];  Service: Gynecology;  Laterality: N/A;  . Laparoscopy N/A 11/01/2013    Procedure: LAPAROSCOPY DIAGNOSTIC;  Surgeon: Ena Dawley, MD;  Location: [facility];  Service: Gynecology;  Laterality: N/A;  . Tee without cardioversion N/A 05/11/2014    Procedure: TRANSESOPHAGEAL ECHOCARDIOGRAM (TEE);  Surgeon: Sanda Klein, MD;  Location: [facility];  Service: Cardiovascular;  Laterality: N/A;  . Loop recorder implant N/A 05/11/2014    Procedure: LOOP RECORDER IMPLANT;  Surgeon: Coralyn Mark, MD;  Location: [facility];  Service: Cardiovascular;  Laterality: N/A;    There were no vitals filed for this visit.  Visit Diagnosis:  Activity intolerance  Balance problems  Weakness due to cerebrovascular accident  Abnormality of gait      Subjective Assessment - 10/04/14 1454    Symptoms Reports feels better today.    Currently in Pain? No/denies     Gait Training: PT demo /instructed in longer step length. Patient ambulated 600' including 75' on grass/ gravel, outside ramp & curb with single point cane [facility]) with min guard. At end of session, patient ambulated 150' with single point cane with cues on step length with SBA.  Neuromuscular Re-ed: Alternate cone taps for balance with SPC with minA. Ladder stepping with SPC with min guard to increase step length and equality. With UE support on PT shoulders: side stepping right 10' and left 10'; backwards 10' all with min guard.  Therapeutic Exercise: Bilateral leg press 45# 15 reps with tactile cues for RLE control. NuStep Level 2 with 4 extremities X 5 min at 30-34 spm  PT Education - 10/04/14 1530    Education provided Yes   Education Details Increasing activity level with higher frequency in home & going into community with family working towards activities she used to do before CVA.   Person(s) Educated Patient   Methods Explanation   Comprehension Verbalized understanding          PT Short Term Goals - 10/02/14 1252    PT SHORT TERM GOAL #1   Title demonstrates understanding of updated HEP (Target Date: 11/02/14)   Time 30   Period Days   Status New   PT SHORT TERM GOAL #2   Title Gait Velocity >1.4 ft/sec with single point cane (Target Date: 11/02/14)   Time 30   Period Days   Status New   PT SHORT TERM  GOAL #3   Title Berg Balance > 45/56 (Target Date: 11/02/14)   Time 30   Period Days   Status New   PT SHORT TERM GOAL #4   Title Timed Up & Go <20sec with single point cane (Target Date: 11/02/14)   Time 30   Period Days   Status New   PT SHORT TERM GOAL #5   Title ambulates 300' outside including some grass surfaces with single point cane and negotiates ramp /curb with supervision. (Target Date: 11/02/14)   Time 30   Period Days   Status New           PT Long Term Goals - 10/02/14 1242    PT LONG TERM GOAL #1   Title demonstrates HEP correctly. (NEW Target Date: 11/30/14)   Baseline partially met 10/02/14 Patient understands current HEP but needs to be progressed.   Time 8   Period Weeks   Status On-going   PT LONG TERM GOAL #2   Title Gait Velocity >1.0 ft/sec target date 3/12/206   Baseline MET 10/02/14 at 1.12 ft/sec   Time 4   Period Weeks   Status Achieved   PT LONG TERM GOAL #3   Title Berg Balance >/= 50/56 (New Target Date: 11/30/14)   Baseline 10/02/14 improved but not met Merrilee Jansky 43/56 (initial was 37/56)   Time 8   Period Weeks   Status Revised   PT LONG TERM GOAL #4   Title Timed Up & Go <18 seconds (New Target Date: 11/30/14)   Baseline 10/01/14 - improved but not met TUG 22.55 sec with single point cane   Time 8   Period Weeks   Status Revised   PT LONG TERM GOAL #5   Title ambulates 300' & negotiates ramp/curb with single point cane modified independent. (New Target Date: 11/30/14)   Baseline 10/02/14 improved but not met - patient ambulates 300' with single point cane with supervision on level surfaces and min A on ramp /curb.   Time 8   Period Weeks   Status On-going   Additional Long Term Goals   Additional Long Term Goals Yes   PT LONG TERM GOAL #6   Title Gait Velocity >1.8 ft/sec (New Target Date: 11/30/14)   Time 8   Period Weeks   Status New               Plan - 10/04/14 1445    Clinical Impression Statement Patient was able to improve step length and  thereby gait velocity with tactile / min guard.   Pt will benefit from skilled therapeutic intervention in order to improve on the following deficits Abnormal gait;Decreased activity tolerance;Decreased balance;Decreased endurance;Decreased mobility;Decreased  range of motion;Decreased strength   Rehab Potential Good   PT Frequency 2x / week   PT Duration 8 weeks   PT Treatment/Interventions Therapeutic exercise;ADLs/Self Care Home Management;DME Instruction;Gait training;Stair training;Functional mobility training;Therapeutic activities;Balance training;Neuromuscular re-education;Patient/family education   PT Next Visit Plan gait with single point cane on barriers & grass, work on head turns with gait, balance & strength   Consulted and Agree with Plan of Care Patient;Family member/caregiver   Family Member Consulted daughter        Problem List Patient Active Problem List   Diagnosis Date Noted  . Essential hypertension 09/18/2014  . Seizure 07/12/2014  . History of CVA with residual deficit 05/30/2014  . Seizures 05/11/2014  . CVA (cerebral infarction) 05/08/2014  . Diabetes mellitus type 2, controlled 05/08/2014  . Hypothyroidism 05/08/2014  . Hypercholesteremia 11/29/2013  . Status post laparoscopy 11/01/2013  . OSA on CPAP 10/31/2013  . HTN (hypertension) 10/31/2013  . Morbid obesity 10/31/2013  . Leg edema 10/31/2013  . Ovarian cyst 10/31/2013  . Preoperative cardiovascular examination 10/31/2013  . Pelvic mass 10/31/2013  . DM type 2 (diabetes mellitus, type 2) 10/31/2013  . History of CHF (congestive heart failure) 10/31/2013  . Acute renal insufficiency 10/31/2013  . Unspecified hypothyroidism 10/31/2013    Jamey Reas PT, DPT 10/04/2014, 9:24 PM  [facility] [address] [place], [place], [address] Phone: [phone]   Fax:  [phone]

## 2014-10-04 NOTE — Therapy (Signed)
[facility] [address] [place], [place], [address] Phone: [phone]   Fax:  [phone]  Speech Language Pathology Treatment  Patient Details  Name: Abigail Johns MRN: [id number] Date of Birth: 08-Nov-1950 Referring Provider:  Prince Solian, MD  Encounter Date: 10/04/2014      End of Session - 10/04/14 1401    Visit Number 8   Number of Visits 16   Date for SLP Re-Evaluation 11/08/14   SLP Start Time 1320   SLP Stop Time  1400   SLP Time Calculation (min) 40 min   Activity Tolerance Patient tolerated treatment well      Past Medical History  Diagnosis Date  . CHF (congestive heart failure)   . Hypertension   . High cholesterol   . Thyroid disease   . Diabetes mellitus   . Back pain, chronic   . Renal disorder   . Obesity   . OSA on CPAP   . Edema     Past Surgical History  Procedure Laterality Date  . Nephrolithotomy      Right  . Abdominal hysterectomy    . Eye surgery    . Bunionectomy      both feet  . Left knee surgery      Torn meniscus  . US echocardiography  12/24/2011    mild LVH,mild TR,trace MR,PI  . Laparoscopic ovarian cystectomy Right 11/01/2013    Procedure: LAPAROSCOPIC RIGHT OVARIAN CYSTECTOMY WITH COLLECTION OF WASHINGS ;  Surgeon: Ena Dawley, MD;  Location: [facility];  Service: Gynecology;  Laterality: Right;  . Laparoscopic lysis of adhesions N/A 11/01/2013    Procedure: LAPAROSCOPIC LYSIS OF ADHESIONS;  Surgeon: Ena Dawley, MD;  Location: [facility];  Service: Gynecology;  Laterality: N/A;  . Laparoscopy N/A 11/01/2013    Procedure: LAPAROSCOPY DIAGNOSTIC;  Surgeon: Ena Dawley, MD;  Location: [facility];  Service: Gynecology;  Laterality: N/A;  . Tee without cardioversion N/A 05/11/2014    Procedure: TRANSESOPHAGEAL ECHOCARDIOGRAM (TEE);  Surgeon: Sanda Klein, MD;  Location: [facility];  Service: Cardiovascular;  Laterality: N/A;  . Loop recorder implant N/A  05/11/2014    Procedure: LOOP RECORDER IMPLANT;  Surgeon: Coralyn Mark, MD;  Location: [facility];  Service: Cardiovascular;  Laterality: N/A;    There were no vitals filed for this visit.  Visit Diagnosis: Apraxia  Aphasia      Subjective Assessment - 10/04/14 1325    Symptoms "I forgot to bring me that title. They did some Kool-Aid. There was probably 7 or 8 new shorts."               ADULT SLP TREATMENT - 10/04/14 1326    General Information   Behavior/Cognition Alert;Cooperative;Pleasant mood   Treatment Provided   Treatment provided Cognitive-Linquistic   Pain Assessment   Pain Assessment No/denies pain   Cognitive-Linquistic Treatment   Treatment focused on Apraxia;Aphasia   Skilled Treatment SLP facilitated therapy for auditory comprehension by reading 4-5 sentence paragraphs and assessing pt response to questions re: that paragraph with 70% success; incr'd to 95% occasional min A. Named mod complex items 80%, with semantic paraphasias. Error awareness 50%; once SLP repeated pt's error to pt, pt with awareness at 90%. Simple conversation non functional today.   Assessment / Recommendations / Plan   Plan Continue with current plan of care   Progression Toward Goals   Progression toward goals Progressing toward goals            SLP  Short Term Goals - 10/04/14 1328    SLP SHORT TERM GOAL #1   Title pt will complete simple naming with 85% success over three sessions (began 08-09-14)   Status Achieved   SLP SHORT TERM GOAL #2   Title pt will complete functional communication with simple wants/needs via multimodal communication with 80% success and rare min A (began 08-09-14)   Status Deferred  Pt no longer needs   SLP SHORT TERM GOAL #3   Title demo understanding of simple conversation 90% of the time (began 08-09-14)   Status Achieved   SLP SHORT TERM GOAL #4   Title demo understanding of 4-5 sentence spoken information by answering questions with 90%  success and occasional min A (began 08-09-14)   Time --   Period --   Status Achieved   SLP SHORT TERM GOAL #5   Title pt will complete HEP with occasional min A (began 08-09-14)   Status Deferred  pt did not receive HEP due to pressence of aphasia          SLP Long Term Goals - 10/04/14 1403    SLP LONG TERM GOAL #1   Title pt will demo understanding of mod complex conversation with occasional min A (began 08-09-14)   Time 5   Period Weeks  or 16 visits)   Status On-going   SLP LONG TERM GOAL #2   Title pt will use multimodal communication PRN with rare min cues (began 08-09-14)   Time 5   Period Weeks  or 16 visits   Status Revised   SLP LONG TERM GOAL #3   Title pt will complete any HEP with rare min A (began 08-09-14)   Time 5   Period Weeks  or 16 visits   Status Deferred          Plan - 10/04/14 1402    Clinical Impression Statement Apraxia and aphasaia persist. Pt with decr'd comprehension of longer auditoy stimuli. Simple conversation non functional today.   Speech Therapy Frequency 2x / week   Duration --  5 weeks   Treatment/Interventions Language facilitation;Cueing hierarchy;Multimodal communcation approach;Functional tasks;Patient/family education;Compensatory strategies;Internal/external aids;SLP instruction and feedback   Potential to Achieve Goals Good   Potential Considerations Severity of impairments        Problem List Patient Active Problem List   Diagnosis Date Noted  . Essential hypertension 09/18/2014  . Seizure 07/12/2014  . History of CVA with residual deficit 05/30/2014  . Seizures 05/11/2014  . CVA (cerebral infarction) 05/08/2014  . Diabetes mellitus type 2, controlled 05/08/2014  . Hypothyroidism 05/08/2014  . Hypercholesteremia 11/29/2013  . Status post laparoscopy 11/01/2013  . OSA on CPAP 10/31/2013  . HTN (hypertension) 10/31/2013  . Morbid obesity 10/31/2013  . Leg edema 10/31/2013  . Ovarian cyst 10/31/2013  .  Preoperative cardiovascular examination 10/31/2013  . Pelvic mass 10/31/2013  . DM type 2 (diabetes mellitus, type 2) 10/31/2013  . History of CHF (congestive heart failure) 10/31/2013  . Acute renal insufficiency 10/31/2013  . Unspecified hypothyroidism 10/31/2013    Garald Balding, SLP 10/04/2014, 2:04 PM  [facility] [address] [place], [place], [address] Phone: [phone]   Fax:  [phone]

## 2014-10-05 ENCOUNTER — Encounter: Payer: Self-pay | Admitting: Cardiovascular Disease

## 2014-10-08 ENCOUNTER — Ambulatory Visit (INDEPENDENT_AMBULATORY_CARE_PROVIDER_SITE_OTHER): Payer: BLUE CROSS/BLUE SHIELD | Admitting: *Deleted

## 2014-10-08 DIAGNOSIS — I63412 Cerebral infarction due to embolism of left middle cerebral artery: Secondary | ICD-10-CM

## 2014-10-08 LAB — MDC_IDC_ENUM_SESS_TYPE_REMOTE

## 2014-10-09 ENCOUNTER — Ambulatory Visit: Payer: BLUE CROSS/BLUE SHIELD

## 2014-10-09 ENCOUNTER — Ambulatory Visit: Payer: BLUE CROSS/BLUE SHIELD | Admitting: Occupational Therapy

## 2014-10-09 DIAGNOSIS — R4701 Aphasia: Secondary | ICD-10-CM

## 2014-10-09 DIAGNOSIS — I69359 Hemiplegia and hemiparesis following cerebral infarction affecting unspecified side: Secondary | ICD-10-CM

## 2014-10-09 DIAGNOSIS — R2689 Other abnormalities of gait and mobility: Secondary | ICD-10-CM

## 2014-10-09 DIAGNOSIS — IMO0002 Reserved for concepts with insufficient information to code with codable children: Secondary | ICD-10-CM

## 2014-10-09 DIAGNOSIS — R6889 Other general symptoms and signs: Secondary | ICD-10-CM

## 2014-10-09 DIAGNOSIS — R269 Unspecified abnormalities of gait and mobility: Secondary | ICD-10-CM

## 2014-10-09 DIAGNOSIS — R482 Apraxia: Secondary | ICD-10-CM

## 2014-10-09 DIAGNOSIS — I69898 Other sequelae of other cerebrovascular disease: Secondary | ICD-10-CM | POA: Diagnosis not present

## 2014-10-09 NOTE — Therapy (Signed)
[facility] [address] [place], [place], [address] Phone: [phone]   Fax:  [phone]  Occupational Therapy Treatment  Patient Details  Name: Abigail Johns MRN: [id number] Date of Birth: 1951-05-19 Referring Provider:  Prince Solian, MD  Encounter Date: 10/09/2014      OT End of Session - 10/09/14 1005    Visit Number 5   Number of Visits 17   Date for OT Re-Evaluation 11/23/14   Authorization Type [employer]   Authorization Time Period WEEK 3/8   OT Start Time 0935   OT Stop Time 1015   OT Time Calculation (min) 40 min   Activity Tolerance Patient tolerated treatment well      Past Medical History  Diagnosis Date  . CHF (congestive heart failure)   . Hypertension   . High cholesterol   . Thyroid disease   . Diabetes mellitus   . Back pain, chronic   . Renal disorder   . Obesity   . OSA on CPAP   . Edema     Past Surgical History  Procedure Laterality Date  . Nephrolithotomy      Right  . Abdominal hysterectomy    . Eye surgery    . Bunionectomy      both feet  . Left knee surgery      Torn meniscus  . US echocardiography  12/24/2011    mild LVH,mild TR,trace MR,PI  . Laparoscopic ovarian cystectomy Right 11/01/2013    Procedure: LAPAROSCOPIC RIGHT OVARIAN CYSTECTOMY WITH COLLECTION OF WASHINGS ;  Surgeon: Ena Dawley, MD;  Location: [facility];  Service: Gynecology;  Laterality: Right;  . Laparoscopic lysis of adhesions N/A 11/01/2013    Procedure: LAPAROSCOPIC LYSIS OF ADHESIONS;  Surgeon: Ena Dawley, MD;  Location: [facility];  Service: Gynecology;  Laterality: N/A;  . Laparoscopy N/A 11/01/2013    Procedure: LAPAROSCOPY DIAGNOSTIC;  Surgeon: Ena Dawley, MD;  Location: [facility];  Service: Gynecology;  Laterality: N/A;  . Tee without cardioversion N/A 05/11/2014    Procedure: TRANSESOPHAGEAL ECHOCARDIOGRAM (TEE);  Surgeon: Sanda Klein, MD;  Location: [facility];  Service:  Cardiovascular;  Laterality: N/A;  . Loop recorder implant N/A 05/11/2014    Procedure: LOOP RECORDER IMPLANT;  Surgeon: Coralyn Mark, MD;  Location: [facility];  Service: Cardiovascular;  Laterality: N/A;    There were no vitals filed for this visit.  Visit Diagnosis:  Hemiparesis affecting dominant side as late effect of cerebrovascular accident  Lack of coordination due to stroke      Subjective Assessment - 10/09/14 0953    Symptoms Eating entirely with my Rt hand and brushing teeth w/ Rt hand, using both hands to brush hair (per daughter report)   Pertinent History CVA 03/2014, another mild CVA 04/2014 but all impairments/deficits from 1st CVA   Patient Stated Goals Get back to work   Currently in Pain? No/denies                    OT Treatments/Exercises (OP) - 10/09/14 1093    Neurological Re-education Exercises   Scapular Stabilization Prone  over physioball   Other Exercises 1 closed chain body on arm and arm on body movements activating entire RUE at approx. 100-110* sh. flexion and horizontal abduction. BUE AA/ROM in high range shoulder flexion (closed chain); then progressing to same activity with ball for proper alignment.    Other Exercises 2 prone over physioball for scapula strengthening: with horizontal abduction bilaterally with min  tactile cues RUE, then in full shoulder flexion bilaterally w/ min assist RUE.    Functional Reaching Activities   Mid Level Placing medium sized pegs in pegboard vertical surface requiring 90-110* shoulder flexion for functional reaching, endurance and coordination RUE.                   OT Short Term Goals - 09/25/14 1032    OT SHORT TERM GOAL #1   Title Independent w/ HEP for coordination and Rt shoulder (due 10/25/14)   Time 4   Period Weeks   Status On-going   OT SHORT TERM GOAL #2   Title Improve RUE functional use as evidenced by performing 38 blocks or greater on Box & Blocks test    Baseline eval =  31   Time 4   Period Weeks   Status On-going   OT SHORT TERM GOAL #3   Title Improve coordination as evidenced by performing 9 hole peg test in 30 sec. or less Rt hand   Baseline eval = 36.60 sec.   Time 4   Period Weeks   Status On-going   OT SHORT TERM GOAL #4   Title Pt/family to verbalize understanding w/ CVA warning signs and risk factors   Time 4   Period Weeks   Status On-going   OT SHORT TERM GOAL #5   Title Pt to perform functional reaching at 115 degrees sh. flexion to retrieve and replace light objects from shelf consistently   Baseline 75% sh. flex (105-110*)   Time 4   Period Weeks   Status On-going           OT Long Term Goals - 09/25/14 1033    OT LONG TERM GOAL #1   Title Independent w/ updated strengthening HEP (DUE 11/23/14)   Time 8   Period Weeks   Status On-going   OT LONG TERM GOAL #2   Title Improve Rt hand strength to 48 lbs or greater for gripping/lifting tasks   Baseline eval = 40 lbs   Time 8   Period Weeks   Status On-going   OT LONG TERM GOAL #3   Title Pt to return to full IADLS    Baseline eval = only performing simple IADLS   Time 8   Period Weeks   Status On-going   OT LONG TERM GOAL #4   Title Pt to perform financial management tasks at 90% accuracy w/ extra time prn    Baseline eval = dependent d/t aphasia   Time 8   Period Weeks   Status On-going               Plan - 10/09/14 1010    Clinical Impression Statement Pt progressing with RUE function, proximal strength and endurance.    Plan prone: scapula retraction with horizontal abduction, shoulder extension prone (RUE over EOB), weight bearing, in hand manipulation        Problem List Patient Active Problem List   Diagnosis Date Noted  . Essential hypertension 09/18/2014  . Seizure 07/12/2014  . History of CVA with residual deficit 05/30/2014  . Seizures 05/11/2014  . CVA (cerebral infarction) 05/08/2014  . Diabetes mellitus type 2, controlled 05/08/2014   . Hypothyroidism 05/08/2014  . Hypercholesteremia 11/29/2013  . Status post laparoscopy 11/01/2013  . OSA on CPAP 10/31/2013  . HTN (hypertension) 10/31/2013  . Morbid obesity 10/31/2013  . Leg edema 10/31/2013  . Ovarian cyst 10/31/2013  . Preoperative cardiovascular examination 10/31/2013  .  Pelvic mass 10/31/2013  . DM type 2 (diabetes mellitus, type 2) 10/31/2013  . History of CHF (congestive heart failure) 10/31/2013  . Acute renal insufficiency 10/31/2013  . Unspecified hypothyroidism 10/31/2013    Carey Bullocks, OTR/L 10/09/2014, 10:15 AM  [facility] [address] [place], [place], [address] Phone: [phone]   Fax:  [phone]

## 2014-10-09 NOTE — Therapy (Signed)
[facility] [address] [place], [place], [address] Phone: [phone]   Fax:  [phone]  Physical Therapy Treatment  Patient Details  Name: Abigail Johns MRN: [id number] Date of Birth: 1951-06-16 Referring Provider:  Prince Solian, MD  Encounter Date: 10/09/2014      PT End of Session - 10/09/14 1104    Visit Number 7   Number of Visits 25   Date for PT Re-Evaluation 11/30/14   PT Start Time 1018   PT Stop Time 1059   PT Time Calculation (min) 41 min   Equipment Utilized During Treatment Gait belt   Activity Tolerance Patient tolerated treatment well   Behavior During Therapy [facility] for tasks assessed/performed      Past Medical History  Diagnosis Date  . CHF (congestive heart failure)   . Hypertension   . High cholesterol   . Thyroid disease   . Diabetes mellitus   . Back pain, chronic   . Renal disorder   . Obesity   . OSA on CPAP   . Edema     Past Surgical History  Procedure Laterality Date  . Nephrolithotomy      Right  . Abdominal hysterectomy    . Eye surgery    . Bunionectomy      both feet  . Left knee surgery      Torn meniscus  . US echocardiography  12/24/2011    mild LVH,mild TR,trace MR,PI  . Laparoscopic ovarian cystectomy Right 11/01/2013    Procedure: LAPAROSCOPIC RIGHT OVARIAN CYSTECTOMY WITH COLLECTION OF WASHINGS ;  Surgeon: Ena Dawley, MD;  Location: [facility];  Service: Gynecology;  Laterality: Right;  . Laparoscopic lysis of adhesions N/A 11/01/2013    Procedure: LAPAROSCOPIC LYSIS OF ADHESIONS;  Surgeon: Ena Dawley, MD;  Location: [facility];  Service: Gynecology;  Laterality: N/A;  . Laparoscopy N/A 11/01/2013    Procedure: LAPAROSCOPY DIAGNOSTIC;  Surgeon: Ena Dawley, MD;  Location: [facility];  Service: Gynecology;  Laterality: N/A;  . Tee without cardioversion N/A 05/11/2014    Procedure: TRANSESOPHAGEAL ECHOCARDIOGRAM (TEE);  Surgeon: Sanda Klein, MD;   Location: [facility];  Service: Cardiovascular;  Laterality: N/A;  . Loop recorder implant N/A 05/11/2014    Procedure: LOOP RECORDER IMPLANT;  Surgeon: Coralyn Mark, MD;  Location: [facility];  Service: Cardiovascular;  Laterality: N/A;    There were no vitals filed for this visit.  Visit Diagnosis:  Abnormality of gait  Balance problems  Activity intolerance  Weakness due to cerebrovascular accident      Subjective Assessment - 10/09/14 1020    Symptoms Pt denied falls or changes since last visit.    Patient Stated Goals Walking better in community.   Currently in Pain? No/denies                       [facility] Adult PT Treatment/Exercise - 10/09/14 1021    Ambulation/Gait   Ambulation/Gait Yes   Ambulation/Gait Assistance 5: Supervision;4: Min guard   Ambulation/Gait Assistance Details Pt ambulated over even/uneven terrain with SPC, while performing head turns and counting white cars. Pt unable to count cars due to cognitive impairments, and required cues for upright posture, improved stride length and to improve R heel strike. Pt required seated rest break after amb. due to fatigue. Pt also amb. with SPC over even terrain and 8 hurdles, with min guard for safety. VC's to improve lat. weight shifting in order to step over hurdle.  Ambulation Distance (Feet) 550 Feet  50'x2   Assistive device Straight cane   Gait Pattern Step-through pattern;Decreased dorsiflexion - right   Ambulation Surface Level;Unlevel;Indoor;Outdoor;Paved;Grass   Balance   Balance Assessed Yes   Dynamic Standing Balance   Dynamic Standing - Balance Support No upper extremity supported   Dynamic Standing - Level of Assistance 4: Min assist;Other (comment)  min guard   Dynamic Standing - Balance Activities Other (comment)   Dynamic Standing - Comments B LE single cone taps 2x6 cones/LE, alternating cone taps x10/LE, and sidestepping. VC's and demonstation for technique and to improve  lateral weight shifting. Min A during cone taps with R foot with pt progressing to min guard during L foot cone taps. Pt required seated rest breaks after each set of cones due to fatigue.                  PT Short Term Goals - 10/09/14 1107    PT SHORT TERM GOAL #1   Title demonstrates understanding of updated HEP (Target Date: 11/02/14)   Time 30   Period Days   Status On-going   PT SHORT TERM GOAL #2   Title Gait Velocity >1.4 ft/sec with single point cane (Target Date: 11/02/14)   Time 30   Period Days   Status On-going   PT SHORT TERM GOAL #3   Title Berg Balance > 45/56 (Target Date: 11/02/14)   Time 30   Period Days   Status On-going   PT SHORT TERM GOAL #4   Title Timed Up & Go <20sec with single point cane (Target Date: 11/02/14)   Time 30   Period Days   Status On-going   PT SHORT TERM GOAL #5   Title ambulates 300' outside including some grass surfaces with single point cane and negotiates ramp /curb with supervision. (Target Date: 11/02/14)   Time 30   Period Days   Status On-going           PT Long Term Goals - 10/09/14 1107    PT LONG TERM GOAL #1   Title demonstrates HEP correctly. (NEW Target Date: 11/30/14)   Baseline partially met 10/02/14 Patient understands current HEP but needs to be progressed.   Time 8   Period Weeks   Status On-going   PT LONG TERM GOAL #2   Title Gait Velocity >1.0 ft/sec target date 3/12/206   Baseline MET 10/02/14 at 1.12 ft/sec   Time 4   Period Weeks   Status Achieved   PT LONG TERM GOAL #3   Title Berg Balance >/= 50/56 (New Target Date: 11/30/14)   Baseline 10/02/14 improved but not met Merrilee Jansky 43/56 (initial was 37/56)   Time 8   Period Weeks   Status On-going   PT LONG TERM GOAL #4   Title Timed Up & Go <18 seconds (New Target Date: 11/30/14)   Baseline 10/01/14 - improved but not met TUG 22.55 sec with single point cane   Time 8   Period Weeks   Status On-going   PT LONG TERM GOAL #5   Title ambulates 300' & negotiates  ramp/curb with single point cane modified independent. (New Target Date: 11/30/14)   Baseline 10/02/14 improved but not met - patient ambulates 300' with single point cane with supervision on level surfaces and min A on ramp /curb.   Time 8   Period Weeks   Status On-going   PT LONG TERM GOAL #6   Title Gait Velocity >1.8 ft/sec (  New Target Date: 11/30/14)   Time 8   Period Weeks   Status On-going               Plan - 10/09/14 1105    Clinical Impression Statement Pt demonstrated progress as she was able to amb. with SPC over uneven terrain and perform dynamic balance activites without SPC. Pt progressed from min A to min guard during balance activities. Continue with POC.   Pt will benefit from skilled therapeutic intervention in order to improve on the following deficits Abnormal gait;Decreased activity tolerance;Decreased balance;Decreased endurance;Decreased mobility;Decreased range of motion;Decreased strength   Rehab Potential Good   PT Frequency 2x / week   PT Duration 8 weeks   PT Treatment/Interventions Therapeutic exercise;ADLs/Self Care Home Management;DME Instruction;Gait training;Stair training;Functional mobility training;Therapeutic activities;Balance training;Neuromuscular re-education;Patient/family education   PT Next Visit Plan Continue gait with SPC over uneven terrain/grass, dynamic balance activities with no UE support.   PT Home Exercise Plan balance Terrence Dupont   Consulted and Agree with Plan of Care Patient;Family member/caregiver   Family Member Consulted daughter        Problem List Patient Active Problem List   Diagnosis Date Noted  . Essential hypertension 09/18/2014  . Seizure 07/12/2014  . History of CVA with residual deficit 05/30/2014  . Seizures 05/11/2014  . CVA (cerebral infarction) 05/08/2014  . Diabetes mellitus type 2, controlled 05/08/2014  . Hypothyroidism 05/08/2014  . Hypercholesteremia 11/29/2013  . Status post laparoscopy 11/01/2013   . OSA on CPAP 10/31/2013  . HTN (hypertension) 10/31/2013  . Morbid obesity 10/31/2013  . Leg edema 10/31/2013  . Ovarian cyst 10/31/2013  . Preoperative cardiovascular examination 10/31/2013  . Pelvic mass 10/31/2013  . DM type 2 (diabetes mellitus, type 2) 10/31/2013  . History of CHF (congestive heart failure) 10/31/2013  . Acute renal insufficiency 10/31/2013  . Unspecified hypothyroidism 10/31/2013    Shaymus Eveleth L 10/09/2014, 5:08 PM  [facility] [address] [place], [place], [address] Phone: [phone]   Fax:  [phone]     Geoffry Paradise, PT,DPT 10/09/2014 5:08 PM Phone: [phone] Fax: [phone]

## 2014-10-09 NOTE — Therapy (Signed)
[facility] [address] [place], [place], [address] Phone: [phone]   Fax:  [phone]  Speech Language Pathology Treatment  Patient Details  Name: Abigail Johns MRN: [id number] Date of Birth: 26-Nov-1950 Referring Provider:  Prince Solian, MD  Encounter Date: 10/09/2014      End of Session - 10/09/14 1148    Visit Number 9   Number of Visits 16   Date for SLP Re-Evaluation 11/08/14   SLP Start Time 1103   SLP Stop Time  1146   SLP Time Calculation (min) 43 min   Activity Tolerance Patient tolerated treatment well      Past Medical History  Diagnosis Date  . CHF (congestive heart failure)   . Hypertension   . High cholesterol   . Thyroid disease   . Diabetes mellitus   . Back pain, chronic   . Renal disorder   . Obesity   . OSA on CPAP   . Edema     Past Surgical History  Procedure Laterality Date  . Nephrolithotomy      Right  . Abdominal hysterectomy    . Eye surgery    . Bunionectomy      both feet  . Left knee surgery      Torn meniscus  . US echocardiography  12/24/2011    mild LVH,mild TR,trace MR,PI  . Laparoscopic ovarian cystectomy Right 11/01/2013    Procedure: LAPAROSCOPIC RIGHT OVARIAN CYSTECTOMY WITH COLLECTION OF WASHINGS ;  Surgeon: Ena Dawley, MD;  Location: [facility];  Service: Gynecology;  Laterality: Right;  . Laparoscopic lysis of adhesions N/A 11/01/2013    Procedure: LAPAROSCOPIC LYSIS OF ADHESIONS;  Surgeon: Ena Dawley, MD;  Location: [facility];  Service: Gynecology;  Laterality: N/A;  . Laparoscopy N/A 11/01/2013    Procedure: LAPAROSCOPY DIAGNOSTIC;  Surgeon: Ena Dawley, MD;  Location: [facility];  Service: Gynecology;  Laterality: N/A;  . Tee without cardioversion N/A 05/11/2014    Procedure: TRANSESOPHAGEAL ECHOCARDIOGRAM (TEE);  Surgeon: Sanda Klein, MD;  Location: [facility];  Service: Cardiovascular;  Laterality: N/A;  . Loop recorder implant N/A  05/11/2014    Procedure: LOOP RECORDER IMPLANT;  Surgeon: Coralyn Mark, MD;  Location: [facility];  Service: Cardiovascular;  Laterality: N/A;    There were no vitals filed for this visit.  Visit Diagnosis: Apraxia  Aphasia      Subjective Assessment - 10/09/14 1111    Symptoms "It's hot in your garden (office)."               ADULT SLP TREATMENT - 10/09/14 1111    General Information   Behavior/Cognition Alert;Cooperative;Pleasant mood   Treatment Provided   Treatment provided Cognitive-Linquistic   Pain Assessment   Pain Assessment No/denies pain   Cognitive-Linquistic Treatment   Treatment focused on Apraxia;Aphasia   Skilled Treatment Conversation prior to structured tasks (simple re: tasks in therapy) -not functional. Pt sequenced 3-step cards independently, and provided 4-5 word descriptions with usual mod A from SLP (phonemic cues).    Assessment / Recommendations / Plan   Plan Continue with current plan of care   Progression Toward Goals   Progression toward goals Progressing toward goals          SLP Education - 10/09/14 1148    Education provided Yes   Education Details home tasks, how to cue pt   Person(s) Educated Patient;Child(ren)   Methods Explanation;Demonstration;Verbal cues   Comprehension Verbalized understanding  SLP Short Term Goals - 10/04/14 1328    SLP SHORT TERM GOAL #1   Title pt will complete simple naming with 85% success over three sessions (began 08-09-14)   Status Achieved   SLP SHORT TERM GOAL #2   Title pt will complete functional communication with simple wants/needs via multimodal communication with 80% success and rare min A (began 08-09-14)   Status Deferred  Pt no longer needs   SLP SHORT TERM GOAL #3   Title demo understanding of simple conversation 90% of the time (began 08-09-14)   Status Achieved   SLP SHORT TERM GOAL #4   Title demo understanding of 4-5 sentence spoken information by answering  questions with 90% success and occasional min A (began 08-09-14)   Time --   Period --   Status Achieved   SLP SHORT TERM GOAL #5   Title pt will complete HEP with occasional min A (began 08-09-14)   Status Deferred  pt did not receive HEP due to pressence of aphasia          SLP Long Term Goals - 10/09/14 [place] #1   Title pt will demo understanding of mod complex conversation with occasional min A (began 08-09-14)   Time 4   Period Weeks  or 16 visits)   Status On-going   SLP LONG TERM GOAL #2   Title pt will use multimodal communication PRN with rare min cues (began 08-09-14)   Time 4   Period Weeks  or 16 visits   Status Revised   SLP LONG TERM GOAL #3   Title pt will complete any HEP with rare min A (began 08-09-14)   Period --  or 16 visits   Status Deferred          Plan - 10/09/14 1148    Clinical Impression Statement Apraxia and aphasaia persist. Pt with decr'd comprehension of longer auditoy stimuli. Simple conversation non functional today.   Speech Therapy Frequency 2x / week   Duration 4 weeks   Treatment/Interventions Language facilitation;Cueing hierarchy;Multimodal communcation approach;Functional tasks;Patient/family education;Compensatory strategies;Internal/external aids;SLP instruction and feedback   Potential to Achieve Goals Good   Potential Considerations Severity of impairments        Problem List Patient Active Problem List   Diagnosis Date Noted  . Essential hypertension 09/18/2014  . Seizure 07/12/2014  . History of CVA with residual deficit 05/30/2014  . Seizures 05/11/2014  . CVA (cerebral infarction) 05/08/2014  . Diabetes mellitus type 2, controlled 05/08/2014  . Hypothyroidism 05/08/2014  . Hypercholesteremia 11/29/2013  . Status post laparoscopy 11/01/2013  . OSA on CPAP 10/31/2013  . HTN (hypertension) 10/31/2013  . Morbid obesity 10/31/2013  . Leg edema 10/31/2013  . Ovarian cyst 10/31/2013  .  Preoperative cardiovascular examination 10/31/2013  . Pelvic mass 10/31/2013  . DM type 2 (diabetes mellitus, type 2) 10/31/2013  . History of CHF (congestive heart failure) 10/31/2013  . Acute renal insufficiency 10/31/2013  . Unspecified hypothyroidism 10/31/2013    [facility], SLP 10/09/2014, 11:50 AM  [facility] [address] [place], [place], [address] Phone: [phone]   Fax:  [phone]

## 2014-10-11 ENCOUNTER — Ambulatory Visit: Payer: BLUE CROSS/BLUE SHIELD

## 2014-10-11 ENCOUNTER — Ambulatory Visit: Payer: BLUE CROSS/BLUE SHIELD | Admitting: Physical Therapy

## 2014-10-11 ENCOUNTER — Ambulatory Visit: Payer: BLUE CROSS/BLUE SHIELD | Admitting: Occupational Therapy

## 2014-10-11 NOTE — Progress Notes (Signed)
Loop recorder 

## 2014-10-16 ENCOUNTER — Ambulatory Visit: Payer: BLUE CROSS/BLUE SHIELD

## 2014-10-16 ENCOUNTER — Ambulatory Visit: Payer: BLUE CROSS/BLUE SHIELD | Admitting: Physical Therapy

## 2014-10-16 ENCOUNTER — Ambulatory Visit: Payer: BLUE CROSS/BLUE SHIELD | Admitting: Occupational Therapy

## 2014-10-16 ENCOUNTER — Encounter: Payer: Self-pay | Admitting: Physical Therapy

## 2014-10-16 ENCOUNTER — Encounter: Payer: Self-pay | Admitting: Occupational Therapy

## 2014-10-16 DIAGNOSIS — R2689 Other abnormalities of gait and mobility: Secondary | ICD-10-CM

## 2014-10-16 DIAGNOSIS — R4701 Aphasia: Secondary | ICD-10-CM

## 2014-10-16 DIAGNOSIS — I69359 Hemiplegia and hemiparesis following cerebral infarction affecting unspecified side: Secondary | ICD-10-CM

## 2014-10-16 DIAGNOSIS — IMO0002 Reserved for concepts with insufficient information to code with codable children: Secondary | ICD-10-CM

## 2014-10-16 DIAGNOSIS — I69898 Other sequelae of other cerebrovascular disease: Secondary | ICD-10-CM | POA: Diagnosis not present

## 2014-10-16 DIAGNOSIS — R482 Apraxia: Secondary | ICD-10-CM

## 2014-10-16 DIAGNOSIS — R269 Unspecified abnormalities of gait and mobility: Secondary | ICD-10-CM

## 2014-10-16 DIAGNOSIS — R6889 Other general symptoms and signs: Secondary | ICD-10-CM

## 2014-10-16 NOTE — Therapy (Signed)
[facility] [address] [place], [place], [address] Phone: [phone]   Fax:  [phone]  Occupational Therapy Treatment  Patient Details  Name: Abigail Johns MRN: [id number] Date of Birth: 1950-09-07 Referring Provider:  Prince Solian, MD  Encounter Date: 10/16/2014      OT End of Session - 10/16/14 1006    Visit Number 6   Number of Visits 17   Date for OT Re-Evaluation 11/23/14   Authorization Type [employer]   Authorization Time Period WEEK 4/8   OT Start Time 0930   OT Stop Time 1015   OT Time Calculation (min) 45 min   Activity Tolerance Patient tolerated treatment well      Past Medical History  Diagnosis Date  . CHF (congestive heart failure)   . Hypertension   . High cholesterol   . Thyroid disease   . Diabetes mellitus   . Back pain, chronic   . Renal disorder   . Obesity   . OSA on CPAP   . Edema     Past Surgical History  Procedure Laterality Date  . Nephrolithotomy      Right  . Abdominal hysterectomy    . Eye surgery    . Bunionectomy      both feet  . Left knee surgery      Torn meniscus  . US echocardiography  12/24/2011    mild LVH,mild TR,trace MR,PI  . Laparoscopic ovarian cystectomy Right 11/01/2013    Procedure: LAPAROSCOPIC RIGHT OVARIAN CYSTECTOMY WITH COLLECTION OF WASHINGS ;  Surgeon: Ena Dawley, MD;  Location: [facility];  Service: Gynecology;  Laterality: Right;  . Laparoscopic lysis of adhesions N/A 11/01/2013    Procedure: LAPAROSCOPIC LYSIS OF ADHESIONS;  Surgeon: Ena Dawley, MD;  Location: [facility];  Service: Gynecology;  Laterality: N/A;  . Laparoscopy N/A 11/01/2013    Procedure: LAPAROSCOPY DIAGNOSTIC;  Surgeon: Ena Dawley, MD;  Location: [facility];  Service: Gynecology;  Laterality: N/A;  . Tee without cardioversion N/A 05/11/2014    Procedure: TRANSESOPHAGEAL ECHOCARDIOGRAM (TEE);  Surgeon: Sanda Klein, MD;  Location: [facility];  Service:  Cardiovascular;  Laterality: N/A;  . Loop recorder implant N/A 05/11/2014    Procedure: LOOP RECORDER IMPLANT;  Surgeon: Coralyn Mark, MD;  Location: [facility];  Service: Cardiovascular;  Laterality: N/A;    There were no vitals filed for this visit.  Visit Diagnosis:  Hemiparesis affecting dominant side as late effect of cerebrovascular accident  Lack of coordination due to stroke      Subjective Assessment - 10/16/14 0935    Pertinent History CVA 03/2014, another mild CVA 04/2014 but all impairments/deficits from 1st CVA   Patient Stated Goals Get back to work   Currently in Pain? Yes   Pain Score 5    Pain Location Shoulder   Pain Orientation Right   Pain Descriptors / Indicators Sore  Pt reports from bruising since on plavix   Pain Type Chronic pain   Pain Frequency Constant   Aggravating Factors  nothing id   Pain Relieving Factors nothing id                    OT Treatments/Exercises (OP) - 10/16/14 0940    Fine Motor Coordination   Fine Motor Coordination In hand manipuation training   In Hand Manipulation Training translating stress balls in Rt hand with min to mod difficulty. Placing medium sized pegs in pegboard manipulating 4 at a time w/ min  drops   Neurological Re-education Exercises   Scapular Stabilization Prone   Other Exercises 2 Prone: scapula retraction x 15 reps (arms down) with mod tactile cues/assist required to perform correctly. Progressed to scapula retraction with horizontal abduction (elbows bent) w/ mod assist. Shoulder extension over EOB with min assist to prevent IR. Scapula retraction over EOB with min assist. Pt appears very weak in posterior shoulder girdle.    Functional Reaching Activities   Mid Level Placing graded clothes pins on antenna yellow to blue resistance for functional reaching and pinch strength. Pt with min difficulty higher range with blue resistance only.                   OT Short Term Goals -  10/16/14 1005    OT SHORT TERM GOAL #1   Title Independent w/ HEP for coordination and Rt shoulder (due 10/25/14)   Time 4   Period Weeks   Status Achieved   OT SHORT TERM GOAL #2   Title Improve RUE functional use as evidenced by performing 38 blocks or greater on Box & Blocks test    Baseline eval = 31   Time 4   Period Weeks   Status On-going   OT SHORT TERM GOAL #3   Title Improve coordination as evidenced by performing 9 hole peg test in 30 sec. or less Rt hand   Baseline eval = 36.60 sec.   Time 4   Period Weeks   Status On-going   OT SHORT TERM GOAL #4   Title Pt/family to verbalize understanding w/ CVA warning signs and risk factors   Time 4   Period Weeks   Status Achieved   OT SHORT TERM GOAL #5   Title Pt to perform functional reaching at 115 degrees sh. flexion to retrieve and replace light objects from shelf consistently   Baseline 75% sh. flex (105-110*)   Time 4   Period Weeks   Status Achieved           OT Long Term Goals - 09/25/14 1033    OT LONG TERM GOAL #1   Title Independent w/ updated strengthening HEP (DUE 11/23/14)   Time 8   Period Weeks   Status On-going   OT LONG TERM GOAL #2   Title Improve Rt hand strength to 48 lbs or greater for gripping/lifting tasks   Baseline eval = 40 lbs   Time 8   Period Weeks   Status On-going   OT LONG TERM GOAL #3   Title Pt to return to full IADLS    Baseline eval = only performing simple IADLS   Time 8   Period Weeks   Status On-going   OT LONG TERM GOAL #4   Title Pt to perform financial management tasks at 90% accuracy w/ extra time prn    Baseline eval = dependent d/t aphasia   Time 8   Period Weeks   Status On-going               Plan - 10/16/14 1007    Clinical Impression Statement Pt progressing with functional reaching and coordination tasks. Pt met STG's #1, #4, and #5.    Plan assess STG's #2 and 3. Continue neuro re-education RUE   Consulted and Agree with Plan of Care Patient         Problem List Patient Active Problem List   Diagnosis Date Noted  . Essential hypertension 09/18/2014  . Seizure 07/12/2014  . History  of CVA with residual deficit 05/30/2014  . Seizures 05/11/2014  . CVA (cerebral infarction) 05/08/2014  . Diabetes mellitus type 2, controlled 05/08/2014  . Hypothyroidism 05/08/2014  . Hypercholesteremia 11/29/2013  . Status post laparoscopy 11/01/2013  . OSA on CPAP 10/31/2013  . HTN (hypertension) 10/31/2013  . Morbid obesity 10/31/2013  . Leg edema 10/31/2013  . Ovarian cyst 10/31/2013  . Preoperative cardiovascular examination 10/31/2013  . Pelvic mass 10/31/2013  . DM type 2 (diabetes mellitus, type 2) 10/31/2013  . History of CHF (congestive heart failure) 10/31/2013  . Acute renal insufficiency 10/31/2013  . Unspecified hypothyroidism 10/31/2013    Carey Bullocks, OTR/L 10/16/2014, 10:13 AM  [facility] [address] [place], [place], [address] Phone: [phone]   Fax:  [phone]

## 2014-10-16 NOTE — Therapy (Signed)
[facility] [address] [place], [place], [address] Phone: [phone]   Fax:  [phone]  Physical Therapy Treatment  Patient Details  Name: Abigail Johns MRN: [id number] Date of Birth: January 03, 1951 Referring Provider:  Prince Solian, MD  Encounter Date: 10/16/2014      PT End of Session - 10/16/14 1100    Visit Number 8   Number of Visits 25   Date for PT Re-Evaluation 11/30/14   PT Start Time 1100   PT Stop Time 1144   PT Time Calculation (min) 44 min   Equipment Utilized During Treatment Gait belt   Activity Tolerance Patient tolerated treatment well   Behavior During Therapy [facility] for tasks assessed/performed      Past Medical History  Diagnosis Date  . CHF (congestive heart failure)   . Hypertension   . High cholesterol   . Thyroid disease   . Diabetes mellitus   . Back pain, chronic   . Renal disorder   . Obesity   . OSA on CPAP   . Edema     Past Surgical History  Procedure Laterality Date  . Nephrolithotomy      Right  . Abdominal hysterectomy    . Eye surgery    . Bunionectomy      both feet  . Left knee surgery      Torn meniscus  . US echocardiography  12/24/2011    mild LVH,mild TR,trace MR,PI  . Laparoscopic ovarian cystectomy Right 11/01/2013    Procedure: LAPAROSCOPIC RIGHT OVARIAN CYSTECTOMY WITH COLLECTION OF WASHINGS ;  Surgeon: Ena Dawley, MD;  Location: [facility];  Service: Gynecology;  Laterality: Right;  . Laparoscopic lysis of adhesions N/A 11/01/2013    Procedure: LAPAROSCOPIC LYSIS OF ADHESIONS;  Surgeon: Ena Dawley, MD;  Location: [facility];  Service: Gynecology;  Laterality: N/A;  . Laparoscopy N/A 11/01/2013    Procedure: LAPAROSCOPY DIAGNOSTIC;  Surgeon: Ena Dawley, MD;  Location: [facility];  Service: Gynecology;  Laterality: N/A;  . Tee without cardioversion N/A 05/11/2014    Procedure: TRANSESOPHAGEAL ECHOCARDIOGRAM (TEE);  Surgeon: Sanda Klein, MD;   Location: [facility];  Service: Cardiovascular;  Laterality: N/A;  . Loop recorder implant N/A 05/11/2014    Procedure: LOOP RECORDER IMPLANT;  Surgeon: Coralyn Mark, MD;  Location: [facility];  Service: Cardiovascular;  Laterality: N/A;    There were no vitals filed for this visit.  Visit Diagnosis:  Abnormality of gait  Balance problems  Activity intolerance  Weakness due to cerebrovascular accident      Subjective Assessment - 10/16/14 1109    Symptoms No falls. No other issues.    Currently in Pain? Yes   Pain Score 5    Pain Location Shoulder   Pain Orientation Right                       [facility] Adult PT Treatment/Exercise - 10/16/14 1100    Transfers   Sit to Stand 5: Supervision;With upper extremity assist;With armrests;From chair/3-in-1   Stand to Sit 5: Supervision;With upper extremity assist;With armrests;To chair/3-in-1   Ambulation/Gait   Ambulation/Gait Yes   Ambulation/Gait Assistance 5: Supervision;4: Min guard  tactile cues on maintaining speed   Ambulation/Gait Assistance Details looking around scanning environment & talking while walking,    Ambulation Distance (Feet) 550 Feet   Assistive device Straight cane   Gait Pattern Step-through pattern;Decreased dorsiflexion - right   Ambulation Surface Level;Unlevel;Indoor;Outdoor;Paved;Grass   Door Management  5: Supervision   Ramp 5: Supervision   Curb 5: Supervision   Balance   Balance Assessed Yes   Dynamic Standing Balance   Dynamic Standing - Balance Support No upper extremity supported   Dynamic Standing - Level of Assistance 4: Min assist   Dynamic Standing - Balance Activities Rocker board   High Level Balance   High Level Balance Activities Braiding;Side stepping;Tandem walking;Marching forwards;Marching backwards  parallel bar   High Level Balance Comments verbal cues                  PT Short Term Goals - 10/09/14 1107    PT SHORT TERM GOAL #1   Title  demonstrates understanding of updated HEP (Target Date: 11/02/14)   Time 30   Period Days   Status On-going   PT SHORT TERM GOAL #2   Title Gait Velocity >1.4 ft/sec with single point cane (Target Date: 11/02/14)   Time 30   Period Days   Status On-going   PT SHORT TERM GOAL #3   Title Berg Balance > 45/56 (Target Date: 11/02/14)   Time 30   Period Days   Status On-going   PT SHORT TERM GOAL #4   Title Timed Up & Go <20sec with single point cane (Target Date: 11/02/14)   Time 30   Period Days   Status On-going   PT SHORT TERM GOAL #5   Title ambulates 300' outside including some grass surfaces with single point cane and negotiates ramp /curb with supervision. (Target Date: 11/02/14)   Time 30   Period Days   Status On-going           PT Long Term Goals - 10/09/14 1107    PT LONG TERM GOAL #1   Title demonstrates HEP correctly. (NEW Target Date: 11/30/14)   Baseline partially met 10/02/14 Patient understands current HEP but needs to be progressed.   Time 8   Period Weeks   Status On-going   PT LONG TERM GOAL #2   Title Gait Velocity >1.0 ft/sec target date 3/12/206   Baseline MET 10/02/14 at 1.12 ft/sec   Time 4   Period Weeks   Status Achieved   PT LONG TERM GOAL #3   Title Berg Balance >/= 50/56 (New Target Date: 11/30/14)   Baseline 10/02/14 improved but not met Merrilee Jansky 43/56 (initial was 37/56)   Time 8   Period Weeks   Status On-going   PT LONG TERM GOAL #4   Title Timed Up & Go <18 seconds (New Target Date: 11/30/14)   Baseline 10/01/14 - improved but not met TUG 22.55 sec with single point cane   Time 8   Period Weeks   Status On-going   PT LONG TERM GOAL #5   Title ambulates 300' & negotiates ramp/curb with single point cane modified independent. (New Target Date: 11/30/14)   Baseline 10/02/14 improved but not met - patient ambulates 300' with single point cane with supervision on level surfaces and min A on ramp /curb.   Time 8   Period Weeks   Status On-going   PT LONG TERM  GOAL #6   Title Gait Velocity >1.8 ft/sec (New Target Date: 11/30/14)   Time 8   Period Weeks   Status On-going               Plan - 10/16/14 1145    Clinical Impression Statement Patient required tactile cues to maintain speed when scanning or talking while walking. Patient  was able to improve balance reactions to losses with tactile cues & repetition with balance activities.   Pt will benefit from skilled therapeutic intervention in order to improve on the following deficits Abnormal gait;Decreased activity tolerance;Decreased balance;Decreased endurance;Decreased mobility;Decreased range of motion;Decreased strength   Rehab Potential Good   PT Frequency 2x / week   PT Duration 8 weeks   PT Treatment/Interventions Therapeutic exercise;ADLs/Self Care Home Management;DME Instruction;Gait training;Stair training;Functional mobility training;Therapeutic activities;Balance training;Neuromuscular re-education;Patient/family education   PT Next Visit Plan Continue gait with SPC over uneven terrain/grass, dynamic balance activities with no UE support.   PT Home Exercise Plan balance Terrence Dupont   Consulted and Agree with Plan of Care Patient;Family member/caregiver   Family Member Consulted daughter        Problem List Patient Active Problem List   Diagnosis Date Noted  . Essential hypertension 09/18/2014  . Seizure 07/12/2014  . History of CVA with residual deficit 05/30/2014  . Seizures 05/11/2014  . CVA (cerebral infarction) 05/08/2014  . Diabetes mellitus type 2, controlled 05/08/2014  . Hypothyroidism 05/08/2014  . Hypercholesteremia 11/29/2013  . Status post laparoscopy 11/01/2013  . OSA on CPAP 10/31/2013  . HTN (hypertension) 10/31/2013  . Morbid obesity 10/31/2013  . Leg edema 10/31/2013  . Ovarian cyst 10/31/2013  . Preoperative cardiovascular examination 10/31/2013  . Pelvic mass 10/31/2013  . DM type 2 (diabetes mellitus, type 2) 10/31/2013  . History of CHF  (congestive heart failure) 10/31/2013  . Acute renal insufficiency 10/31/2013  . Unspecified hypothyroidism 10/31/2013    Jamey Reas PT, DPT 10/16/2014, 9:30 PM  [facility] [address] [place], [place], [address] Phone: [phone]   Fax:  [phone]

## 2014-10-16 NOTE — Therapy (Signed)
[facility] [address] [place], [place], [address] Phone: [phone]   Fax:  [phone]  Speech Language Pathology Treatment  Patient Details  Name: Abigail Johns MRN: [id number] Date of Birth: 14-Feb-1951 Referring Provider:  Prince Solian, MD  Encounter Date: 10/16/2014      End of Session - 10/16/14 1055    Visit Number 10   Number of Visits 16   Date for SLP Re-Evaluation 11/08/14   SLP Start Time 30   SLP Stop Time  1100   SLP Time Calculation (min) 41 min   Activity Tolerance Patient tolerated treatment well      Past Medical History  Diagnosis Date  . CHF (congestive heart failure)   . Hypertension   . High cholesterol   . Thyroid disease   . Diabetes mellitus   . Back pain, chronic   . Renal disorder   . Obesity   . OSA on CPAP   . Edema     Past Surgical History  Procedure Laterality Date  . Nephrolithotomy      Right  . Abdominal hysterectomy    . Eye surgery    . Bunionectomy      both feet  . Left knee surgery      Torn meniscus  . US echocardiography  12/24/2011    mild LVH,mild TR,trace MR,PI  . Laparoscopic ovarian cystectomy Right 11/01/2013    Procedure: LAPAROSCOPIC RIGHT OVARIAN CYSTECTOMY WITH COLLECTION OF WASHINGS ;  Surgeon: Ena Dawley, MD;  Location: [facility];  Service: Gynecology;  Laterality: Right;  . Laparoscopic lysis of adhesions N/A 11/01/2013    Procedure: LAPAROSCOPIC LYSIS OF ADHESIONS;  Surgeon: Ena Dawley, MD;  Location: [facility];  Service: Gynecology;  Laterality: N/A;  . Laparoscopy N/A 11/01/2013    Procedure: LAPAROSCOPY DIAGNOSTIC;  Surgeon: Ena Dawley, MD;  Location: [facility];  Service: Gynecology;  Laterality: N/A;  . Tee without cardioversion N/A 05/11/2014    Procedure: TRANSESOPHAGEAL ECHOCARDIOGRAM (TEE);  Surgeon: Sanda Klein, MD;  Location: [facility];  Service: Cardiovascular;  Laterality: N/A;  . Loop recorder implant N/A  05/11/2014    Procedure: LOOP RECORDER IMPLANT;  Surgeon: Coralyn Mark, MD;  Location: [facility];  Service: Cardiovascular;  Laterality: N/A;    There were no vitals filed for this visit.  Visit Diagnosis: Apraxia  Aphasia      Subjective Assessment - 10/16/14 1026    Symptoms "I don't wanna -- do it again." (hooking mother up to dialysis)               ADULT SLP TREATMENT - 10/16/14 1029    General Information   Behavior/Cognition Alert;Cooperative;Pleasant mood   Treatment Provided   Treatment provided Cognitive-Linquistic   Pain Assessment   Pain Assessment 0-10   Pain Score 5    Pain Location rt shoulder   Pain Descriptors / Indicators Sore   Pain Intervention(s) Monitored during session   Cognitive-Linquistic Treatment   Treatment focused on Apraxia;Aphasia   Skilled Treatment Conversation re: weekend events functional with rare SLP questioning cues - overall success 85-90%.  Pt requested repeats x2 for SLP comments, gave incorrect answer x2, demonstrating reduced understanding in simple conversation. Sentence responses with rare min A from SLP. Pt with off-topic remarks occasionally during session today, for the first time.    Assessment / Recommendations / Plan   Plan Continue with current plan of care   Progression Toward Goals   Progression toward goals Progressing  toward goals            SLP Short Term Goals - 10/04/14 1328    SLP SHORT TERM GOAL #1   Title pt will complete simple naming with 85% success over three sessions (began 08-09-14)   Status Achieved   SLP SHORT TERM GOAL #2   Title pt will complete functional communication with simple wants/needs via multimodal communication with 80% success and rare min A (began 08-09-14)   Status Deferred  Pt no longer needs   SLP SHORT TERM GOAL #3   Title demo understanding of simple conversation 90% of the time (began 08-09-14)   Status Achieved   SLP SHORT TERM GOAL #4   Title demo understanding  of 4-5 sentence spoken information by answering questions with 90% success and occasional min A (began 08-09-14)   Time --   Period --   Status Achieved   SLP SHORT TERM GOAL #5   Title pt will complete HEP with occasional min A (began 08-09-14)   Status Deferred  pt did not receive HEP due to pressence of aphasia          SLP Long Term Goals - 10/16/14 1024    SLP LONG TERM GOAL #1   Title pt will demo understanding of mod complex conversation with occasional min A (began 08-09-14)   Time 3   Period Weeks  or 16 visits)   Status On-going   SLP LONG TERM GOAL #2   Title pt will use multimodal communication PRN with rare min cues (began 08-09-14)   Time 3   Period Weeks  or 16 visits   Status Revised   SLP LONG TERM GOAL #3   Title pt will complete any HEP with rare min A (began 08-09-14)   Period --  or 16 visits   Status Deferred          Plan - 10/16/14 1056    Clinical Impression Statement Pt's verbal expression skills improved since last week. Simple conversation functional today with rare min A.    Speech Therapy Frequency 2x / week   Duration --  3 weeks   Treatment/Interventions Language facilitation;Cueing hierarchy;Multimodal communcation approach;Functional tasks;Patient/family education;Compensatory strategies;Internal/external aids;SLP instruction and feedback   Potential to Achieve Goals Good   Potential Considerations Severity of impairments        Problem List Patient Active Problem List   Diagnosis Date Noted  . Essential hypertension 09/18/2014  . Seizure 07/12/2014  . History of CVA with residual deficit 05/30/2014  . Seizures 05/11/2014  . CVA (cerebral infarction) 05/08/2014  . Diabetes mellitus type 2, controlled 05/08/2014  . Hypothyroidism 05/08/2014  . Hypercholesteremia 11/29/2013  . Status post laparoscopy 11/01/2013  . OSA on CPAP 10/31/2013  . HTN (hypertension) 10/31/2013  . Morbid obesity 10/31/2013  . Leg edema 10/31/2013  .  Ovarian cyst 10/31/2013  . Preoperative cardiovascular examination 10/31/2013  . Pelvic mass 10/31/2013  . DM type 2 (diabetes mellitus, type 2) 10/31/2013  . History of CHF (congestive heart failure) 10/31/2013  . Acute renal insufficiency 10/31/2013  . Unspecified hypothyroidism 10/31/2013    SCHINKE,CARL,SLP 10/16/2014, 11:01 AM  [facility] [address] [place], [place], [address] Phone: [phone]   Fax:  [phone]

## 2014-10-18 ENCOUNTER — Ambulatory Visit: Payer: BLUE CROSS/BLUE SHIELD | Admitting: Physical Therapy

## 2014-10-18 ENCOUNTER — Ambulatory Visit: Payer: BLUE CROSS/BLUE SHIELD

## 2014-10-18 ENCOUNTER — Ambulatory Visit: Payer: BLUE CROSS/BLUE SHIELD | Admitting: Occupational Therapy

## 2014-10-18 ENCOUNTER — Encounter: Payer: Self-pay | Admitting: Physical Therapy

## 2014-10-18 DIAGNOSIS — M6281 Muscle weakness (generalized): Secondary | ICD-10-CM

## 2014-10-18 DIAGNOSIS — R6889 Other general symptoms and signs: Secondary | ICD-10-CM

## 2014-10-18 DIAGNOSIS — R269 Unspecified abnormalities of gait and mobility: Secondary | ICD-10-CM

## 2014-10-18 DIAGNOSIS — IMO0002 Reserved for concepts with insufficient information to code with codable children: Secondary | ICD-10-CM

## 2014-10-18 DIAGNOSIS — R2689 Other abnormalities of gait and mobility: Secondary | ICD-10-CM

## 2014-10-18 DIAGNOSIS — I69898 Other sequelae of other cerebrovascular disease: Secondary | ICD-10-CM | POA: Diagnosis not present

## 2014-10-18 DIAGNOSIS — R482 Apraxia: Secondary | ICD-10-CM

## 2014-10-18 DIAGNOSIS — R4701 Aphasia: Secondary | ICD-10-CM

## 2014-10-18 NOTE — Patient Instructions (Signed)
Speech Exercises  Repeat each one 2 times, twice a day  Call the cat "Buttercup"    A calendar of [place]    Four floors to cover    Yellow oil ointment    Catastrophe in [place]' plums    The church's chimes chimed    Telling time 'til eleven    Five valve levers    Keep the gate closed    Go see that guy    Fat cows give milk    [employer] Gophers    Fat frogs flip freely

## 2014-10-18 NOTE — Therapy (Signed)
[facility] [address] [place], [place], [address] Phone: [phone]   Fax:  [phone]  Physical Therapy Treatment  Patient Details  Name: Abigail Johns MRN: [id number] Date of Birth: 1951/05/08 Referring Provider:  Prince Solian, MD  Encounter Date: 10/18/2014      PT End of Session - 10/18/14 1015    Visit Number 9   Number of Visits 25   Date for PT Re-Evaluation 11/30/14   PT Start Time 3785   PT Stop Time 1100   PT Time Calculation (min) 45 min   Equipment Utilized During Treatment Gait belt   Activity Tolerance Patient tolerated treatment well   Behavior During Therapy [facility] for tasks assessed/performed      Past Medical History  Diagnosis Date  . CHF (congestive heart failure)   . Hypertension   . High cholesterol   . Thyroid disease   . Diabetes mellitus   . Back pain, chronic   . Renal disorder   . Obesity   . OSA on CPAP   . Edema     Past Surgical History  Procedure Laterality Date  . Nephrolithotomy      Right  . Abdominal hysterectomy    . Eye surgery    . Bunionectomy      both feet  . Left knee surgery      Torn meniscus  . US echocardiography  12/24/2011    mild LVH,mild TR,trace MR,PI  . Laparoscopic ovarian cystectomy Right 11/01/2013    Procedure: LAPAROSCOPIC RIGHT OVARIAN CYSTECTOMY WITH COLLECTION OF WASHINGS ;  Surgeon: Ena Dawley, MD;  Location: [facility];  Service: Gynecology;  Laterality: Right;  . Laparoscopic lysis of adhesions N/A 11/01/2013    Procedure: LAPAROSCOPIC LYSIS OF ADHESIONS;  Surgeon: Ena Dawley, MD;  Location: [facility];  Service: Gynecology;  Laterality: N/A;  . Laparoscopy N/A 11/01/2013    Procedure: LAPAROSCOPY DIAGNOSTIC;  Surgeon: Ena Dawley, MD;  Location: [facility];  Service: Gynecology;  Laterality: N/A;  . Tee without cardioversion N/A 05/11/2014    Procedure: TRANSESOPHAGEAL ECHOCARDIOGRAM (TEE);  Surgeon: Sanda Klein, MD;   Location: [facility];  Service: Cardiovascular;  Laterality: N/A;  . Loop recorder implant N/A 05/11/2014    Procedure: LOOP RECORDER IMPLANT;  Surgeon: Coralyn Mark, MD;  Location: [facility];  Service: Cardiovascular;  Laterality: N/A;    There were no vitals filed for this visit.  Visit Diagnosis:  Lack of coordination due to stroke  Abnormality of gait  Balance problems  Activity intolerance  Weakness due to cerebrovascular accident      Subjective Assessment - 10/18/14 1017    Symptoms No falls. A little sore after last PT session but better now.   Currently in Pain? Yes   Pain Score 4    Pain Location Shoulder   Pain Orientation Right;Left   Pain Descriptors / Indicators Sore   Pain Onset 1 to 4 weeks ago   Pain Frequency Constant   Aggravating Factors  medication issue   Pain Relieving Factors changing meds   Multiple Pain Sites No                       [facility] Adult PT Treatment/Exercise - 10/18/14 1015    Transfers   Sit to Stand 5: Supervision;From chair/3-in-1;Without upper extremity assist   Stand to Sit 5: Supervision;To chair/3-in-1;Without upper extremity assist   Ambulation/Gait   Ambulation/Gait Yes   Ambulation/Gait Assistance 5: Supervision;4: Min  guard  tactile cues on maintaining speed   Ambulation/Gait Assistance Details tactile cues for gait speed, step length, posture. Working on talking, scanning & carrying water while walking   Ambulation Distance (Feet) 600 Feet  600' X 1, 300' X 2   Assistive device Straight cane   Gait Pattern Step-through pattern;Decreased dorsiflexion - right   Ambulation Surface Level;Indoor   Stairs Yes   Stairs Assistance 5: Supervision;4: Min assist  SBA with 1 rail / cane, MinA 2 rails reciprocal   Stairs Assistance Details (indicate cue type and reason) cues on reciprocal   Stair Management Technique One rail Right;With cane;Step to pattern;Two rails;Alternating pattern   Number of Stairs 4  3  reps   Door Management --   Ramp 5: Supervision  single point cane   Curb 5: Supervision  single point cane   Balance   Balance Assessed Yes   Dynamic Standing Balance   Dynamic Standing - Balance Support No upper extremity supported   Dynamic Standing - Level of Assistance 4: Min assist   Dynamic Standing - Balance Activities Rocker board   High Level Balance   High Level Balance Activities Braiding;Side stepping;Tandem walking;Marching forwards;Marching backwards;Backward walking  at counter, tiptoe walking   High Level Balance Comments verbal & tactile cues                  PT Short Term Goals - 10/09/14 1107    PT SHORT TERM GOAL #1   Title demonstrates understanding of updated HEP (Target Date: 11/02/14)   Time 30   Period Days   Status On-going   PT SHORT TERM GOAL #2   Title Gait Velocity >1.4 ft/sec with single point cane (Target Date: 11/02/14)   Time 30   Period Days   Status On-going   PT SHORT TERM GOAL #3   Title Berg Balance > 45/56 (Target Date: 11/02/14)   Time 30   Period Days   Status On-going   PT SHORT TERM GOAL #4   Title Timed Up & Go <20sec with single point cane (Target Date: 11/02/14)   Time 30   Period Days   Status On-going   PT SHORT TERM GOAL #5   Title ambulates 300' outside including some grass surfaces with single point cane and negotiates ramp /curb with supervision. (Target Date: 11/02/14)   Time 30   Period Days   Status On-going           PT Long Term Goals - 10/09/14 1107    PT LONG TERM GOAL #1   Title demonstrates HEP correctly. (NEW Target Date: 11/30/14)   Baseline partially met 10/02/14 Patient understands current HEP but needs to be progressed.   Time 8   Period Weeks   Status On-going   PT LONG TERM GOAL #2   Title Gait Velocity >1.0 ft/sec target date 3/12/206   Baseline MET 10/02/14 at 1.12 ft/sec   Time 4   Period Weeks   Status Achieved   PT LONG TERM GOAL #3   Title Berg Balance >/= 50/56 (New Target Date:  11/30/14)   Baseline 10/02/14 improved but not met Berg 43/56 (initial was 37/56)   Time 8   Period Weeks   Status On-going   PT LONG TERM GOAL #4   Title Timed Up & Go <18 seconds (New Target Date: 11/30/14)   Baseline 10/01/14 - improved but not met TUG 22.55 sec with single point cane   Time 8   Period  Weeks   Status On-going   PT LONG TERM GOAL #5   Title ambulates 300' & negotiates ramp/curb with single point cane modified independent. (New Target Date: 11/30/14)   Baseline 10/02/14 improved but not met - patient ambulates 300' with single point cane with supervision on level surfaces and min A on ramp /curb.   Time 8   Period Weeks   Status On-going   PT LONG TERM GOAL #6   Title Gait Velocity >1.8 ft/sec (New Target Date: 11/30/14)   Time 8   Period Weeks   Status On-going               Plan - 10/18/14 1015    Clinical Impression Statement Patient requires tactile cues to increase speed especially when multi-tasking. Patient was short of breath with activities and required rests.   Pt will benefit from skilled therapeutic intervention in order to improve on the following deficits Abnormal gait;Decreased activity tolerance;Decreased balance;Decreased endurance;Decreased mobility;Decreased range of motion;Decreased strength   Rehab Potential Good   PT Frequency 2x / week   PT Duration 8 weeks   PT Treatment/Interventions Therapeutic exercise;ADLs/Self Care Home Management;DME Instruction;Gait training;Stair training;Functional mobility training;Therapeutic activities;Balance training;Neuromuscular re-education;Patient/family education   PT Next Visit Plan Continue gait with SPC over uneven terrain/grass, dynamic balance activities with no UE support.   Consulted and Agree with Plan of Care Patient;Family member/caregiver   Family Member Consulted aunt        Problem List Patient Active Problem List   Diagnosis Date Noted  . Essential hypertension 09/18/2014  . Seizure  07/12/2014  . History of CVA with residual deficit 05/30/2014  . Seizures 05/11/2014  . CVA (cerebral infarction) 05/08/2014  . Diabetes mellitus type 2, controlled 05/08/2014  . Hypothyroidism 05/08/2014  . Hypercholesteremia 11/29/2013  . Status post laparoscopy 11/01/2013  . OSA on CPAP 10/31/2013  . HTN (hypertension) 10/31/2013  . Morbid obesity 10/31/2013  . Leg edema 10/31/2013  . Ovarian cyst 10/31/2013  . Preoperative cardiovascular examination 10/31/2013  . Pelvic mass 10/31/2013  . DM type 2 (diabetes mellitus, type 2) 10/31/2013  . History of CHF (congestive heart failure) 10/31/2013  . Acute renal insufficiency 10/31/2013  . Unspecified hypothyroidism 10/31/2013    Jamey Reas PT, DPT 10/18/2014, 12:53 PM  [facility] [address] [place], [place], [address] Phone: [phone]   Fax:  [phone]

## 2014-10-18 NOTE — Therapy (Signed)
[facility] [address] [place], [place], [address] Phone: [phone]   Fax:  [phone]  Speech Language Pathology Treatment  Patient Details  Name: Abigail Johns MRN: [id number] Date of Birth: Mar 29, 1951 Referring Provider:  Prince Solian, MD  Encounter Date: 10/18/2014      End of Session - 10/18/14 1222    Visit Number 11   Number of Visits 16   Date for SLP Re-Evaluation 11/08/14   SLP Start Time 40   SLP Stop Time  1145   SLP Time Calculation (min) 43 min   Activity Tolerance Patient limited by fatigue      Past Medical History  Diagnosis Date  . CHF (congestive heart failure)   . Hypertension   . High cholesterol   . Thyroid disease   . Diabetes mellitus   . Back pain, chronic   . Renal disorder   . Obesity   . OSA on CPAP   . Edema     Past Surgical History  Procedure Laterality Date  . Nephrolithotomy      Right  . Abdominal hysterectomy    . Eye surgery    . Bunionectomy      both feet  . Left knee surgery      Torn meniscus  . US echocardiography  12/24/2011    mild LVH,mild TR,trace MR,PI  . Laparoscopic ovarian cystectomy Right 11/01/2013    Procedure: LAPAROSCOPIC RIGHT OVARIAN CYSTECTOMY WITH COLLECTION OF WASHINGS ;  Surgeon: Ena Dawley, MD;  Location: [facility];  Service: Gynecology;  Laterality: Right;  . Laparoscopic lysis of adhesions N/A 11/01/2013    Procedure: LAPAROSCOPIC LYSIS OF ADHESIONS;  Surgeon: Ena Dawley, MD;  Location: [facility];  Service: Gynecology;  Laterality: N/A;  . Laparoscopy N/A 11/01/2013    Procedure: LAPAROSCOPY DIAGNOSTIC;  Surgeon: Ena Dawley, MD;  Location: [facility];  Service: Gynecology;  Laterality: N/A;  . Tee without cardioversion N/A 05/11/2014    Procedure: TRANSESOPHAGEAL ECHOCARDIOGRAM (TEE);  Surgeon: Sanda Klein, MD;  Location: [facility];  Service: Cardiovascular;  Laterality: N/A;  . Loop recorder implant N/A 05/11/2014     Procedure: LOOP RECORDER IMPLANT;  Surgeon: Coralyn Mark, MD;  Location: [facility];  Service: Cardiovascular;  Laterality: N/A;    There were no vitals filed for this visit.  Visit Diagnosis: Aphasia  Apraxia      Subjective Assessment - 10/18/14 1133    Symptoms "We showed to every -- staff, but I forget it." PT told SLP pt spoke almost the whole session for multitasking with ambulation.               ADULT SLP TREATMENT - 10/18/14 1104    General Information   Behavior/Cognition Alert;Cooperative;Pleasant mood   Treatment Provided   Treatment provided Cognitive-Linquistic   Pain Assessment   Pain Assessment 0-10   Pain Score 3    Pain Location shoulders   Pain Descriptors / Indicators Sore   Pain Intervention(s) Monitored during session   Cognitive-Linquistic Treatment   Treatment focused on Apraxia;Aphasia   Skilled Treatment Pt described objects' use with sentences with extra time, functionally with 50% success. Paraphasic errors frequently (semantic and phonemic), with some agrammatical language and jargon during the last 15 minutes of session. Pt awareness of expressive errors approx 65%. Greater success earlier in session, by end of session pt's speech skill obviously decr'd.the last 15 minutes of therapy.   Assessment / Recommendations / Plan   Plan Continue with  current plan of care   Progression Toward Goals   Progression toward goals Progressing toward goals          SLP Education - 10/18/14 1222    Education provided Yes   Education Details HEP for apraxia   Person(s) Educated Patient   Methods Explanation;Demonstration;Verbal cues   Comprehension Verbalized understanding;Returned demonstration          SLP Short Term Goals - 10/04/14 1328    SLP SHORT TERM GOAL #1   Title pt will complete simple naming with 85% success over three sessions (began 08-09-14)   Status Achieved   SLP SHORT TERM GOAL #2   Title pt will complete functional  communication with simple wants/needs via multimodal communication with 80% success and rare min A (began 08-09-14)   Status Deferred  Pt no longer needs   SLP SHORT TERM GOAL #3   Title demo understanding of simple conversation 90% of the time (began 08-09-14)   Status Achieved   SLP SHORT TERM GOAL #4   Title demo understanding of 4-5 sentence spoken information by answering questions with 90% success and occasional min A (began 08-09-14)   Time --   Period --   Status Achieved   SLP SHORT TERM GOAL #5   Title pt will complete HEP with occasional min A (began 08-09-14)   Status Deferred  pt did not receive HEP due to pressence of aphasia          SLP Long Term Goals - 10/18/14 1229    SLP LONG TERM GOAL #1   Title pt will demo understanding of mod complex conversation with occasional min A (began 08-09-14)   Time 3   Period Weeks  or 16 visits)   Status On-going   SLP LONG TERM GOAL #2   Title pt will use multimodal communication PRN with rare min cues (began 08-09-14)   Time 3   Period Weeks  or 16 visits   Status Revised   SLP LONG TERM GOAL #3   Title pt will complete any HEP with rare min A (began 08-09-14)   Time 3   Period --  or 16 visits   Status On-going  HEP provided 10-18-14          Plan - 10/18/14 1227    Clinical Impression Statement Pt's verbal expression skills improved since last week, however when fatigued, speech skills definitely decr'd.   Speech Therapy Frequency 2x / week   Duration --  3 weeks   Treatment/Interventions Language facilitation;Cueing hierarchy;Multimodal communcation approach;Functional tasks;Patient/family education;Compensatory strategies;Internal/external aids;SLP instruction and feedback   Potential to Achieve Goals Good   Potential Considerations Severity of impairments        Problem List Patient Active Problem List   Diagnosis Date Noted  . Essential hypertension 09/18/2014  . Seizure 07/12/2014  . History of CVA  with residual deficit 05/30/2014  . Seizures 05/11/2014  . CVA (cerebral infarction) 05/08/2014  . Diabetes mellitus type 2, controlled 05/08/2014  . Hypothyroidism 05/08/2014  . Hypercholesteremia 11/29/2013  . Status post laparoscopy 11/01/2013  . OSA on CPAP 10/31/2013  . HTN (hypertension) 10/31/2013  . Morbid obesity 10/31/2013  . Leg edema 10/31/2013  . Ovarian cyst 10/31/2013  . Preoperative cardiovascular examination 10/31/2013  . Pelvic mass 10/31/2013  . DM type 2 (diabetes mellitus, type 2) 10/31/2013  . History of CHF (congestive heart failure) 10/31/2013  . Acute renal insufficiency 10/31/2013  . Unspecified hypothyroidism 10/31/2013    Garald Balding, SLP  10/18/2014, 12:35 PM  [facility] [address] [place], [place], [address] Phone: [phone]   Fax:  [phone]

## 2014-10-18 NOTE — Therapy (Signed)
[facility] [address] [place], [place], [address] Phone: [phone]   Fax:  [phone]  Occupational Therapy Treatment  Patient Details  Name: Abigail Johns MRN: [id number] Date of Birth: July 31, 1950 Referring Provider:  Prince Solian, MD  Encounter Date: 10/18/2014      OT End of Session - 10/18/14 1008    Visit Number 7   Number of Visits 17   Date for OT Re-Evaluation 11/23/14   Authorization Type [employer]   Authorization Time Period WEEK 4/8   OT Start Time 0935   OT Stop Time 1015   OT Time Calculation (min) 40 min   Activity Tolerance Patient tolerated treatment well      Past Medical History  Diagnosis Date  . CHF (congestive heart failure)   . Hypertension   . High cholesterol   . Thyroid disease   . Diabetes mellitus   . Back pain, chronic   . Renal disorder   . Obesity   . OSA on CPAP   . Edema     Past Surgical History  Procedure Laterality Date  . Nephrolithotomy      Right  . Abdominal hysterectomy    . Eye surgery    . Bunionectomy      both feet  . Left knee surgery      Torn meniscus  . US echocardiography  12/24/2011    mild LVH,mild TR,trace MR,PI  . Laparoscopic ovarian cystectomy Right 11/01/2013    Procedure: LAPAROSCOPIC RIGHT OVARIAN CYSTECTOMY WITH COLLECTION OF WASHINGS ;  Surgeon: Ena Dawley, MD;  Location: [facility];  Service: Gynecology;  Laterality: Right;  . Laparoscopic lysis of adhesions N/A 11/01/2013    Procedure: LAPAROSCOPIC LYSIS OF ADHESIONS;  Surgeon: Ena Dawley, MD;  Location: [facility];  Service: Gynecology;  Laterality: N/A;  . Laparoscopy N/A 11/01/2013    Procedure: LAPAROSCOPY DIAGNOSTIC;  Surgeon: Ena Dawley, MD;  Location: [facility];  Service: Gynecology;  Laterality: N/A;  . Tee without cardioversion N/A 05/11/2014    Procedure: TRANSESOPHAGEAL ECHOCARDIOGRAM (TEE);  Surgeon: Sanda Klein, MD;  Location: [facility];  Service:  Cardiovascular;  Laterality: N/A;  . Loop recorder implant N/A 05/11/2014    Procedure: LOOP RECORDER IMPLANT;  Surgeon: Coralyn Mark, MD;  Location: [facility];  Service: Cardiovascular;  Laterality: N/A;    There were no vitals filed for this visit.  Visit Diagnosis:  Lack of coordination due to stroke  Generalized muscle weakness      Subjective Assessment - 10/18/14 0938    Pertinent History CVA 03/2014, another mild CVA 04/2014 but all impairments/deficits from 1st CVA   Patient Stated Goals Get back to work   Currently in Pain? Yes   Pain Score 4    Pain Location Shoulder   Pain Orientation Right;Left   Pain Descriptors / Indicators Sore   Pain Onset 1 to 4 weeks ago   Aggravating Factors  pt reports due to plavix and bruising   Pain Relieving Factors changing meds            [facility] OT Assessment - 10/18/14 0940    Coordination   Right 9 Hole Peg Test 39.69 sec   Box and Blocks Rt = 31                 OT Treatments/Exercises (OP) - 10/18/14 0001    Exercises   Exercises Shoulder   Shoulder Exercises: ROM/Strengthening   UBE (Upper Arm Bike) x 10 min.  Level 3.5 for reciprocal movement and strength/endurance   Fine Motor Coordination   Small Pegboard Placing small pegs in pegboard with mod drops and extra time.                   OT Short Term Goals - 10/18/14 0947    OT SHORT TERM GOAL #1   Title Independent w/ HEP for coordination and Rt shoulder (due 10/25/14)   Time 4   Period Weeks   Status Achieved   OT SHORT TERM GOAL #2   Title Improve RUE functional use as evidenced by performing 38 blocks or greater on Box & Blocks test    Baseline eval = 31   Time 4   Period Weeks   Status Not Met  10/18/14 = 31 (same as eval)   OT SHORT TERM GOAL #3   Title Improve coordination as evidenced by performing 9 hole peg test in 30 sec. or less Rt hand   Baseline eval = 36.60 sec.   Time 4   Period Weeks   Status Not Met  10/18/14 = 39.69 sec.     OT SHORT TERM GOAL #4   Title Pt/family to verbalize understanding w/ CVA warning signs and risk factors   Time 4   Period Weeks   Status Achieved   OT SHORT TERM GOAL #5   Title Pt to perform functional reaching at 115 degrees sh. flexion to retrieve and replace light objects from shelf consistently   Baseline 75% sh. flex (105-110*)   Time 4   Period Weeks   Status Achieved           OT Long Term Goals - 09/25/14 1033    OT LONG TERM GOAL #1   Title Independent w/ updated strengthening HEP (DUE 11/23/14)   Time 8   Period Weeks   Status On-going   OT LONG TERM GOAL #2   Title Improve Rt hand strength to 48 lbs or greater for gripping/lifting tasks   Baseline eval = 40 lbs   Time 8   Period Weeks   Status On-going   OT LONG TERM GOAL #3   Title Pt to return to full IADLS    Baseline eval = only performing simple IADLS   Time 8   Period Weeks   Status On-going   OT LONG TERM GOAL #4   Title Pt to perform financial management tasks at 90% accuracy w/ extra time prn    Baseline eval = dependent d/t aphasia   Time 8   Period Weeks   Status On-going               Plan - 10/18/14 1008    Clinical Impression Statement Pt did not improve with coordination for STG's #2 and #3. Pt appeared to drop small pegs more today. However Rt shoulder ROM and reaching has improved   Plan continue neuro re-education RUE, grip strength   Consulted and Agree with Plan of Care Patient        Problem List Patient Active Problem List   Diagnosis Date Noted  . Essential hypertension 09/18/2014  . Seizure 07/12/2014  . History of CVA with residual deficit 05/30/2014  . Seizures 05/11/2014  . CVA (cerebral infarction) 05/08/2014  . Diabetes mellitus type 2, controlled 05/08/2014  . Hypothyroidism 05/08/2014  . Hypercholesteremia 11/29/2013  . Status post laparoscopy 11/01/2013  . OSA on CPAP 10/31/2013  . HTN (hypertension) 10/31/2013  . Morbid obesity 10/31/2013  .  Leg edema 10/31/2013  . Ovarian cyst 10/31/2013  . Preoperative cardiovascular examination 10/31/2013  . Pelvic mass 10/31/2013  . DM type 2 (diabetes mellitus, type 2) 10/31/2013  . History of CHF (congestive heart failure) 10/31/2013  . Acute renal insufficiency 10/31/2013  . Unspecified hypothyroidism 10/31/2013    Carey Bullocks, OTR/L 10/18/2014, 10:12 AM  [facility] [address] [place], [place], [address] Phone: [phone]   Fax:  [phone]

## 2014-10-23 ENCOUNTER — Ambulatory Visit: Payer: BLUE CROSS/BLUE SHIELD

## 2014-10-23 ENCOUNTER — Ambulatory Visit: Payer: BLUE CROSS/BLUE SHIELD | Admitting: Occupational Therapy

## 2014-10-23 ENCOUNTER — Encounter: Payer: Self-pay | Admitting: Physical Therapy

## 2014-10-23 ENCOUNTER — Ambulatory Visit: Payer: BLUE CROSS/BLUE SHIELD | Admitting: Physical Therapy

## 2014-10-23 ENCOUNTER — Encounter: Payer: Self-pay | Admitting: Occupational Therapy

## 2014-10-23 DIAGNOSIS — M6281 Muscle weakness (generalized): Secondary | ICD-10-CM

## 2014-10-23 DIAGNOSIS — IMO0002 Reserved for concepts with insufficient information to code with codable children: Secondary | ICD-10-CM

## 2014-10-23 DIAGNOSIS — I69898 Other sequelae of other cerebrovascular disease: Secondary | ICD-10-CM | POA: Diagnosis not present

## 2014-10-23 DIAGNOSIS — R4701 Aphasia: Secondary | ICD-10-CM

## 2014-10-23 DIAGNOSIS — R482 Apraxia: Secondary | ICD-10-CM

## 2014-10-23 DIAGNOSIS — R269 Unspecified abnormalities of gait and mobility: Secondary | ICD-10-CM

## 2014-10-23 DIAGNOSIS — R6889 Other general symptoms and signs: Secondary | ICD-10-CM

## 2014-10-23 DIAGNOSIS — R2689 Other abnormalities of gait and mobility: Secondary | ICD-10-CM

## 2014-10-23 NOTE — Therapy (Signed)
[facility] [address] [place], [place], [address] Phone: [phone]   Fax:  [phone]  Physical Therapy Treatment  Patient Details  Name: Abigail Johns MRN: [id number] Date of Birth: 1950-11-14 Referring Provider:  Prince Solian, MD  Encounter Date: 10/23/2014      PT End of Session - 10/23/14 1220    Visit Number 10   Number of Visits 25   Date for PT Re-Evaluation 11/30/14   PT Start Time 1100   PT Stop Time 1145   PT Time Calculation (min) 45 min   Equipment Utilized During Treatment Gait belt   Activity Tolerance Patient tolerated treatment well   Behavior During Therapy [facility] for tasks assessed/performed      Past Medical History  Diagnosis Date  . CHF (congestive heart failure)   . Hypertension   . High cholesterol   . Thyroid disease   . Diabetes mellitus   . Back pain, chronic   . Renal disorder   . Obesity   . OSA on CPAP   . Edema     Past Surgical History  Procedure Laterality Date  . Nephrolithotomy      Right  . Abdominal hysterectomy    . Eye surgery    . Bunionectomy      both feet  . Left knee surgery      Torn meniscus  . US echocardiography  12/24/2011    mild LVH,mild TR,trace MR,PI  . Laparoscopic ovarian cystectomy Right 11/01/2013    Procedure: LAPAROSCOPIC RIGHT OVARIAN CYSTECTOMY WITH COLLECTION OF WASHINGS ;  Surgeon: Ena Dawley, MD;  Location: [facility];  Service: Gynecology;  Laterality: Right;  . Laparoscopic lysis of adhesions N/A 11/01/2013    Procedure: LAPAROSCOPIC LYSIS OF ADHESIONS;  Surgeon: Ena Dawley, MD;  Location: [facility];  Service: Gynecology;  Laterality: N/A;  . Laparoscopy N/A 11/01/2013    Procedure: LAPAROSCOPY DIAGNOSTIC;  Surgeon: Ena Dawley, MD;  Location: [facility];  Service: Gynecology;  Laterality: N/A;  . Tee without cardioversion N/A 05/11/2014    Procedure: TRANSESOPHAGEAL ECHOCARDIOGRAM (TEE);  Surgeon: Sanda Klein, MD;   Location: [facility];  Service: Cardiovascular;  Laterality: N/A;  . Loop recorder implant N/A 05/11/2014    Procedure: LOOP RECORDER IMPLANT;  Surgeon: Coralyn Mark, MD;  Location: [facility];  Service: Cardiovascular;  Laterality: N/A;    There were no vitals filed for this visit.  Visit Diagnosis:  Weakness due to cerebrovascular accident  Abnormality of gait  Balance problems  Activity intolerance                     [facility] Adult PT Treatment/Exercise - 10/23/14 1100    Transfers   Sit to Stand 5: Supervision;From chair/3-in-1;Without upper extremity assist   Stand to Sit 5: Supervision;To chair/3-in-1;Without upper extremity assist   Ambulation/Gait   Ambulation/Gait Yes   Ambulation/Gait Assistance 5: Supervision;4: Min guard  tactile cues on maintaining speed   Ambulation/Gait Assistance Details worked on talking while walking, scanning envirnment while maintaining speed   Ambulation Distance (Feet) 600 Feet  600' X 1, 300' X 1   Assistive device Straight cane   Gait Pattern Step-through pattern;Decreased dorsiflexion - right   Ambulation Surface Level;Indoor   Stairs Yes   Stairs Assistance 5: Supervision;4: Min assist  SBA with 1 rail / cane, MinA 2 rails reciprocal   Stair Management Technique One rail Right;With cane;Step to pattern;Two rails;Alternating pattern   Number of Stairs 4  3 reps   Ramp 5: Supervision  single point cane   Curb 5: Supervision  single point cane   Curb Details (indicate cue type and reason) cues on foot position prior to descend   Balance   Balance Assessed --   Dynamic Standing Balance   Dynamic Standing - Balance Support --   Dynamic Standing - Level of Assistance --   Dynamic Standing - Balance Activities --   High Level Balance   High Level Balance Activities Side stepping;Backward walking;Tandem walking  without device with minA   High Level Balance Comments verbal & tactile cues        PT instructed  patient and daughter to walk daily after daughter gets home increasing distance or time once set amount is not taxing. Or if shopping using electric carts to walk, rest, walk while shopping. Both verbalized understanding.        PT Education - 10/23/14 1219    Education provided Yes   Education Details CVA, DM & HTN diet, increasing activity level outside   [employer]) Educated Patient;Child(ren)   Methods Explanation   Comprehension Verbalized understanding          PT Short Term Goals - 10/09/14 1107    PT SHORT TERM GOAL #1   Title demonstrates understanding of updated HEP (Target Date: 11/02/14)   Time 30   Period Days   Status On-going   PT SHORT TERM GOAL #2   Title Gait Velocity >1.4 ft/sec with single point cane (Target Date: 11/02/14)   Time 30   Period Days   Status On-going   PT SHORT TERM GOAL #3   Title Berg Balance > 45/56 (Target Date: 11/02/14)   Time 30   Period Days   Status On-going   PT SHORT TERM GOAL #4   Title Timed Up & Go <20sec with single point cane (Target Date: 11/02/14)   Time 30   Period Days   Status On-going   PT SHORT TERM GOAL #5   Title ambulates 300' outside including some grass surfaces with single point cane and negotiates ramp /curb with supervision. (Target Date: 11/02/14)   Time 30   Period Days   Status On-going           PT Long Term Goals - 10/09/14 1107    PT LONG TERM GOAL #1   Title demonstrates HEP correctly. (NEW Target Date: 11/30/14)   Baseline partially met 10/02/14 Patient understands current HEP but needs to be progressed.   Time 8   Period Weeks   Status On-going   PT LONG TERM GOAL #2   Title Gait Velocity >1.0 ft/sec target date 3/12/206   Baseline MET 10/02/14 at 1.12 ft/sec   Time 4   Period Weeks   Status Achieved   PT LONG TERM GOAL #3   Title Berg Balance >/= 50/56 (New Target Date: 11/30/14)   Baseline 10/02/14 improved but not met Merrilee Jansky 43/56 (initial was 37/56)   Time 8   Period Weeks   Status On-going   PT  LONG TERM GOAL #4   Title Timed Up & Go <18 seconds (New Target Date: 11/30/14)   Baseline 10/01/14 - improved but not met TUG 22.55 sec with single point cane   Time 8   Period Weeks   Status On-going   PT LONG TERM GOAL #5   Title ambulates 300' & negotiates ramp/curb with single point cane modified independent. (New Target Date: 11/30/14)   Baseline 10/02/14 improved  but not met - patient ambulates 300' with single point cane with supervision on level surfaces and min A on ramp /curb.   Time 8   Period Weeks   Status On-going   PT LONG TERM GOAL #6   Title Gait Velocity >1.8 ft/sec (New Target Date: 11/30/14)   Time 8   Period Weeks   Status On-going               Plan - 10/23/14 1220    Clinical Impression Statement Patient and daughter report better understanding of diet with CVA, DM & HTN. When patient fatigues with gait, it is rapid. Patient and daughter verbalize understanding of increasing activity level.   Pt will benefit from skilled therapeutic intervention in order to improve on the following deficits Abnormal gait;Decreased activity tolerance;Decreased balance;Decreased endurance;Decreased mobility;Decreased range of motion;Decreased strength   Rehab Potential Good   PT Frequency 2x / week   PT Duration 8 weeks   PT Treatment/Interventions Therapeutic exercise;ADLs/Self Care Home Management;DME Instruction;Gait training;Stair training;Functional mobility training;Therapeutic activities;Balance training;Neuromuscular re-education;Patient/family education   PT Next Visit Plan Continue gait with SPC over uneven terrain/grass, dynamic balance activities with no UE support.   PT Home Exercise Plan balance Terrence Dupont   Consulted and Agree with Plan of Care Patient;Family member/caregiver   Family Member Consulted daughter        Problem List Patient Active Problem List   Diagnosis Date Noted  . Essential hypertension 09/18/2014  . Seizure 07/12/2014  . History of CVA with  residual deficit 05/30/2014  . Seizures 05/11/2014  . CVA (cerebral infarction) 05/08/2014  . Diabetes mellitus type 2, controlled 05/08/2014  . Hypothyroidism 05/08/2014  . Hypercholesteremia 11/29/2013  . Status post laparoscopy 11/01/2013  . OSA on CPAP 10/31/2013  . HTN (hypertension) 10/31/2013  . Morbid obesity 10/31/2013  . Leg edema 10/31/2013  . Ovarian cyst 10/31/2013  . Preoperative cardiovascular examination 10/31/2013  . Pelvic mass 10/31/2013  . DM type 2 (diabetes mellitus, type 2) 10/31/2013  . History of CHF (congestive heart failure) 10/31/2013  . Acute renal insufficiency 10/31/2013  . Unspecified hypothyroidism 10/31/2013    Cleburne Savini PT, DPT 10/23/2014, 12:24 PM  [facility] [address] [place], [place], [address] Phone: [phone]   Fax:  [phone]

## 2014-10-23 NOTE — Therapy (Signed)
[facility] [address] [place], [place], [address] Phone: [phone]   Fax:  [phone]  Occupational Therapy Treatment  Patient Details  Name: Abigail Johns MRN: [id number] Date of Birth: [birth year] Referring Provider:  Prince Solian, MD  Encounter Date: 10/23/2014      OT End of Session - 10/23/14 1024    Visit Number 7   Number of Visits 17   Date for OT Re-Evaluation 11/23/14   Authorization Type BCBS   Authorization Time Period WEEK 5/8   OT Start Time 1021   OT Stop Time 1100   OT Time Calculation (min) 39 min   Activity Tolerance Patient tolerated treatment well      Past Medical History  Diagnosis Date  . CHF (congestive heart failure)   . Hypertension   . High cholesterol   . Thyroid disease   . Diabetes mellitus   . Back pain, chronic   . Renal disorder   . Obesity   . OSA on CPAP   . Edema     Past Surgical History  Procedure Laterality Date  . Nephrolithotomy      Right  . Abdominal hysterectomy    . Eye surgery    . Bunionectomy      both feet  . Left knee surgery      Torn meniscus  . US echocardiography  12/24/2011    mild LVH,mild TR,trace MR,PI  . Laparoscopic ovarian cystectomy Right 11/01/2013    Procedure: LAPAROSCOPIC RIGHT OVARIAN CYSTECTOMY WITH COLLECTION OF WASHINGS ;  Surgeon: Ena Dawley, MD;  Location: [facility];  Service: Gynecology;  Laterality: Right;  . Laparoscopic lysis of adhesions N/A 11/01/2013    Procedure: LAPAROSCOPIC LYSIS OF ADHESIONS;  Surgeon: Ena Dawley, MD;  Location: [facility];  Service: Gynecology;  Laterality: N/A;  . Laparoscopy N/A 11/01/2013    Procedure: LAPAROSCOPY DIAGNOSTIC;  Surgeon: Ena Dawley, MD;  Location: [facility];  Service: Gynecology;  Laterality: N/A;  . Tee without cardioversion N/A 05/11/2014    Procedure: TRANSESOPHAGEAL ECHOCARDIOGRAM (TEE);  Surgeon: Sanda Klein, MD;  Location: [facility];  Service:  Cardiovascular;  Laterality: N/A;  . Loop recorder implant N/A 05/11/2014    Procedure: LOOP RECORDER IMPLANT;  Surgeon: Coralyn Mark, MD;  Location: [facility];  Service: Cardiovascular;  Laterality: N/A;    There were no vitals filed for this visit.  Visit Diagnosis:  Lack of coordination due to stroke  Generalized muscle weakness      Subjective Assessment - 10/23/14 1035    Symptoms pt denies pain.   Pertinent History CVA 03/2014, another mild CVA 04/2014 but all impairments/deficits from 1st CVA   Currently in Pain? No/denies                    OT Treatments/Exercises (OP) - 10/23/14 0001    Fine Motor Coordination   Small Pegboard Placing small pegs in pegboard with min drops and extra time.    Neurological Re-education Exercises   Hand Gripper with Medium Beads Picking up blocks using 35lbs sustained grip strength with min-mod difficulty/drops   Other Weight-Bearing Exercises 1 through hands on table in standing/modified quadraped with forward/backward wt. shifts with min cues.   Functional Reaching Activities   Mid Level to place checkers in connect 4 slots for increased activity tolerance, then to place washers of various sizes on vertical poles for increased coordination/activity tolerance.  OT Short Term Goals - 10/18/14 0947    OT SHORT TERM GOAL #1   Title Independent w/ HEP for coordination and Rt shoulder (due 10/25/14)   Time 4   Period Weeks   Status Achieved   OT SHORT TERM GOAL #2   Title Improve RUE functional use as evidenced by performing 38 blocks or greater on Box & Blocks test    Baseline eval = 31   Time 4   Period Weeks   Status Not Met  10/18/14 = 31 (same as eval)   OT SHORT TERM GOAL #3   Title Improve coordination as evidenced by performing 9 hole peg test in 30 sec. or less Rt hand   Baseline eval = 36.60 sec.   Time 4   Period Weeks   Status Not Met  10/18/14 = 39.69 sec.    OT SHORT TERM GOAL #4    Title Pt/family to verbalize understanding w/ CVA warning signs and risk factors   Time 4   Period Weeks   Status Achieved   OT SHORT TERM GOAL #5   Title Pt to perform functional reaching at 115 degrees sh. flexion to retrieve and replace light objects from shelf consistently   Baseline 75% sh. flex (105-110*)   Time 4   Period Weeks   Status Achieved           OT Long Term Goals - 09/25/14 1033    OT LONG TERM GOAL #1   Title Independent w/ updated strengthening HEP (DUE 11/23/14)   Time 8   Period Weeks   Status On-going   OT LONG TERM GOAL #2   Title Improve Rt hand strength to 48 lbs or greater for gripping/lifting tasks   Baseline eval = 40 lbs   Time 8   Period Weeks   Status On-going   OT LONG TERM GOAL #3   Title Pt to return to full IADLS    Baseline eval = only performing simple IADLS   Time 8   Period Weeks   Status On-going   OT LONG TERM GOAL #4   Title Pt to perform financial management tasks at 90% accuracy w/ extra time prn    Baseline eval = dependent d/t aphasia   Time 8   Period Weeks   Status On-going               Plan - 10/23/14 1220    Clinical Impression Statement Pt demo improved coordination with less drops today with small pegs.  Pt denies pain now.   Plan neuro re-ed, coordination   Consulted and Agree with Plan of Care Patient;Family member/caregiver   Family Member Consulted daughter        Problem List Patient Active Problem List   Diagnosis Date Noted  . Essential hypertension 09/18/2014  . Seizure 07/12/2014  . History of CVA with residual deficit 05/30/2014  . Seizures 05/11/2014  . CVA (cerebral infarction) 05/08/2014  . Diabetes mellitus type 2, controlled 05/08/2014  . Hypothyroidism 05/08/2014  . Hypercholesteremia 11/29/2013  . Status post laparoscopy 11/01/2013  . OSA on CPAP 10/31/2013  . HTN (hypertension) 10/31/2013  . Morbid obesity 10/31/2013  . Leg edema 10/31/2013  . Ovarian cyst 10/31/2013   . Preoperative cardiovascular examination 10/31/2013  . Pelvic mass 10/31/2013  . DM type 2 (diabetes mellitus, type 2) 10/31/2013  . History of CHF (congestive heart failure) 10/31/2013  . Acute renal insufficiency 10/31/2013  . Unspecified hypothyroidism 10/31/2013  [facility] 10/23/2014, 12:22 PM  [facility] [address] [place], [place], [address] Phone: [phone]   Fax:  [place], OTR/L 10/23/2014 12:22 PM

## 2014-10-23 NOTE — Patient Instructions (Signed)
Talk to people as much as you can!

## 2014-10-23 NOTE — Therapy (Signed)
[facility] [address] [place], [place], [address] Phone: [phone]   Fax:  [phone]  Speech Language Pathology Treatment  Patient Details  Name: Abigail Johns MRN: [id number] Date of Birth: 05/17/1951 Referring Provider:  Prince Solian, MD  Encounter Date: 10/23/2014      End of Session - 10/23/14 1015    Visit Number 12   Number of Visits 16   Date for SLP Re-Evaluation 11/08/14   SLP Start Time 0933   SLP Stop Time  1015   SLP Time Calculation (min) 42 min   Activity Tolerance Patient tolerated treatment well      Past Medical History  Diagnosis Date  . CHF (congestive heart failure)   . Hypertension   . High cholesterol   . Thyroid disease   . Diabetes mellitus   . Back pain, chronic   . Renal disorder   . Obesity   . OSA on CPAP   . Edema     Past Surgical History  Procedure Laterality Date  . Nephrolithotomy      Right  . Abdominal hysterectomy    . Eye surgery    . Bunionectomy      both feet  . Left knee surgery      Torn meniscus  . US echocardiography  12/24/2011    mild LVH,mild TR,trace MR,PI  . Laparoscopic ovarian cystectomy Right 11/01/2013    Procedure: LAPAROSCOPIC RIGHT OVARIAN CYSTECTOMY WITH COLLECTION OF WASHINGS ;  Surgeon: Ena Dawley, MD;  Location: [facility];  Service: Gynecology;  Laterality: Right;  . Laparoscopic lysis of adhesions N/A 11/01/2013    Procedure: LAPAROSCOPIC LYSIS OF ADHESIONS;  Surgeon: Ena Dawley, MD;  Location: [facility];  Service: Gynecology;  Laterality: N/A;  . Laparoscopy N/A 11/01/2013    Procedure: LAPAROSCOPY DIAGNOSTIC;  Surgeon: Ena Dawley, MD;  Location: [facility];  Service: Gynecology;  Laterality: N/A;  . Tee without cardioversion N/A 05/11/2014    Procedure: TRANSESOPHAGEAL ECHOCARDIOGRAM (TEE);  Surgeon: Sanda Klein, MD;  Location: [facility];  Service: Cardiovascular;  Laterality: N/A;  . Loop recorder implant N/A  05/11/2014    Procedure: LOOP RECORDER IMPLANT;  Surgeon: Coralyn Mark, MD;  Location: [facility];  Service: Cardiovascular;  Laterality: N/A;    There were no vitals filed for this visit.  Visit Diagnosis: Aphasia  Apraxia      Subjective Assessment - 10/23/14 0940    Symptoms "We went to hair - get my hair done."               ADULT SLP TREATMENT - 10/23/14 0941    General Information   Behavior/Cognition Alert;Cooperative;Pleasant mood   Treatment Provided   Treatment provided Cognitive-Linquistic   Pain Assessment   Pain Assessment No/denies pain   Cognitive-Linquistic Treatment   Treatment focused on Apraxia;Aphasia   Skilled Treatment Simple conversation functional today at beginning of session, re: vacation and plans around vacation. Pt described pictures with occasional aphasic errors, overall 70% success. As task progressed, pt's language showed signs of fatigue, by incr'd frequency of semantic errors.   Assessment / Recommendations / Plan   Plan Continue with current plan of care            SLP Short Term Goals - 10/04/14 1328    SLP SHORT TERM GOAL #1   Title pt will complete simple naming with 85% success over three sessions (began 08-09-14)   Status Achieved   SLP SHORT TERM GOAL #2  Title pt will complete functional communication with simple wants/needs via multimodal communication with 80% success and rare min A (began 08-09-14)   Status Deferred  Pt no longer needs   SLP SHORT TERM GOAL #3   Title demo understanding of simple conversation 90% of the time (began 08-09-14)   Status Achieved   SLP SHORT TERM GOAL #4   Title demo understanding of 4-5 sentence spoken information by answering questions with 90% success and occasional min A (began 08-09-14)   Time --   Period --   Status Achieved   SLP SHORT TERM GOAL #5   Title pt will complete HEP with occasional min A (began 08-09-14)   Status Deferred  pt did not receive HEP due to pressence  of aphasia          SLP Long Term Goals - 10/23/14 1006    SLP LONG TERM GOAL #1   Title pt will demo understanding of mod complex conversation with occasional min A (began 08-09-14)   Time 2   Period Weeks  or 16 visits)   Status On-going   SLP LONG TERM GOAL #2   Title pt will use multimodal communication PRN with rare min cues (began 08-09-14)   Time 2   Period Weeks  or 16 visits   Status Revised   SLP LONG TERM GOAL #3   Title pt will complete any HEP with rare min A (began 08-09-14)   Time 2   Period --  or 16 visits   Status On-going  HEP provided 10-18-14          Plan - 10/23/14 1016    Clinical Impression Statement Pt showed signs of fatigue, linguistically, after approx 30 minutes.   Speech Therapy Frequency 2x / week   Duration 2 weeks   Treatment/Interventions Language facilitation;Cueing hierarchy;Multimodal communcation approach;Functional tasks;Patient/family education;Compensatory strategies;Internal/external aids;SLP instruction and feedback   Potential to Achieve Goals Good   Potential Considerations Severity of impairments        Problem List Patient Active Problem List   Diagnosis Date Noted  . Essential hypertension 09/18/2014  . Seizure 07/12/2014  . History of CVA with residual deficit 05/30/2014  . Seizures 05/11/2014  . CVA (cerebral infarction) 05/08/2014  . Diabetes mellitus type 2, controlled 05/08/2014  . Hypothyroidism 05/08/2014  . Hypercholesteremia 11/29/2013  . Status post laparoscopy 11/01/2013  . OSA on CPAP 10/31/2013  . HTN (hypertension) 10/31/2013  . Morbid obesity 10/31/2013  . Leg edema 10/31/2013  . Ovarian cyst 10/31/2013  . Preoperative cardiovascular examination 10/31/2013  . Pelvic mass 10/31/2013  . DM type 2 (diabetes mellitus, type 2) 10/31/2013  . History of CHF (congestive heart failure) 10/31/2013  . Acute renal insufficiency 10/31/2013  . Unspecified hypothyroidism 10/31/2013    Garald Balding,  SLP 10/23/2014, 10:18 AM  [facility] [address] [place], [place], [address] Phone: [phone]   Fax:  [phone]

## 2014-10-25 ENCOUNTER — Ambulatory Visit: Payer: BLUE CROSS/BLUE SHIELD | Admitting: Occupational Therapy

## 2014-10-25 ENCOUNTER — Ambulatory Visit: Payer: BLUE CROSS/BLUE SHIELD | Admitting: Physical Therapy

## 2014-10-25 ENCOUNTER — Ambulatory Visit: Payer: BLUE CROSS/BLUE SHIELD

## 2014-10-25 ENCOUNTER — Encounter: Payer: Self-pay | Admitting: Physical Therapy

## 2014-10-25 VITALS — BP 105/73 | HR 89

## 2014-10-25 DIAGNOSIS — R269 Unspecified abnormalities of gait and mobility: Secondary | ICD-10-CM

## 2014-10-25 DIAGNOSIS — M6281 Muscle weakness (generalized): Secondary | ICD-10-CM

## 2014-10-25 DIAGNOSIS — IMO0002 Reserved for concepts with insufficient information to code with codable children: Secondary | ICD-10-CM

## 2014-10-25 DIAGNOSIS — I69359 Hemiplegia and hemiparesis following cerebral infarction affecting unspecified side: Secondary | ICD-10-CM

## 2014-10-25 DIAGNOSIS — R482 Apraxia: Secondary | ICD-10-CM

## 2014-10-25 DIAGNOSIS — R4701 Aphasia: Secondary | ICD-10-CM

## 2014-10-25 DIAGNOSIS — R2689 Other abnormalities of gait and mobility: Secondary | ICD-10-CM

## 2014-10-25 DIAGNOSIS — I69898 Other sequelae of other cerebrovascular disease: Secondary | ICD-10-CM | POA: Diagnosis not present

## 2014-10-25 NOTE — Therapy (Signed)
[facility] [address] [place], [place], [address] Phone: [phone]   Fax:  [phone]  Occupational Therapy Treatment  Patient Details  Name: Abigail Johns MRN: [id number] Date of Birth: 1950-12-23 Referring Provider:  Prince Solian, MD  Encounter Date: 10/25/2014      OT End of Session - 10/25/14 1101    Visit Number 8   Number of Visits 17   Date for OT Re-Evaluation 11/23/14   Authorization Type BCBS   Authorization Time Period WEEK 5/8   OT Start Time 1020   OT Stop Time 1100   OT Time Calculation (min) 40 min   Activity Tolerance Patient tolerated treatment well      Past Medical History  Diagnosis Date  . CHF (congestive heart failure)   . Hypertension   . High cholesterol   . Thyroid disease   . Diabetes mellitus   . Back pain, chronic   . Renal disorder   . Obesity   . OSA on CPAP   . Edema     Past Surgical History  Procedure Laterality Date  . Nephrolithotomy      Right  . Abdominal hysterectomy    . Eye surgery    . Bunionectomy      both feet  . Left knee surgery      Torn meniscus  . US echocardiography  12/24/2011    mild LVH,mild TR,trace MR,PI  . Laparoscopic ovarian cystectomy Right 11/01/2013    Procedure: LAPAROSCOPIC RIGHT OVARIAN CYSTECTOMY WITH COLLECTION OF WASHINGS ;  Surgeon: Ena Dawley, MD;  Location: [facility];  Service: Gynecology;  Laterality: Right;  . Laparoscopic lysis of adhesions N/A 11/01/2013    Procedure: LAPAROSCOPIC LYSIS OF ADHESIONS;  Surgeon: Ena Dawley, MD;  Location: [facility];  Service: Gynecology;  Laterality: N/A;  . Laparoscopy N/A 11/01/2013    Procedure: LAPAROSCOPY DIAGNOSTIC;  Surgeon: Ena Dawley, MD;  Location: [facility];  Service: Gynecology;  Laterality: N/A;  . Tee without cardioversion N/A 05/11/2014    Procedure: TRANSESOPHAGEAL ECHOCARDIOGRAM (TEE);  Surgeon: Sanda Klein, MD;  Location: [facility];  Service:  Cardiovascular;  Laterality: N/A;  . Loop recorder implant N/A 05/11/2014    Procedure: LOOP RECORDER IMPLANT;  Surgeon: Coralyn Mark, MD;  Location: [facility];  Service: Cardiovascular;  Laterality: N/A;    There were no vitals filed for this visit.  Visit Diagnosis:  Generalized muscle weakness  Hemiparesis affecting dominant side as late effect of cerebrovascular accident      Subjective Assessment - 10/25/14 1024    Symptoms "I fell this morning on both knees"   Pertinent History CVA 03/2014, another mild CVA 04/2014 but all impairments/deficits from 1st CVA   Patient Stated Goals Get back to work   Currently in Pain? Yes  both knees from fall this am 4/10 - P.T. informed                    OT Treatments/Exercises (OP) - 10/25/14 1043    Shoulder Exercises: ROM/Strengthening   UBE (Upper Arm Bike) x 10 min. Level 3 for reciprocal movement and strength/endurance   Neurological Re-education Exercises   Other Exercises 1 BUE shoulder flexion high range with ball seated with cues for RUE elbow extension. Progressed to scapula retraction over physioball in horizontal abduction bilaterally, then progressed to full shoulder flexion with min assist for Rt scapula/UE.    Other Exercises 2 Closed chain body on arm and arm on body  movements at 115* Rt shoulder flexion. Closed chain AA/ROM in high range sh. flexion alternating UE's, then BUE's simultaneously.    Functional Reaching Activities   Mid Level To place rubber washers on prongs at 90-100* Rt shoulder flexion with fatigue near end of task                  OT Short Term Goals - 10/18/14 0947    OT SHORT TERM GOAL #1   Title Independent w/ HEP for coordination and Rt shoulder (due 10/25/14)   Time 4   Period Weeks   Status Achieved   OT SHORT TERM GOAL #2   Title Improve RUE functional use as evidenced by performing 38 blocks or greater on Box & Blocks test    Baseline eval = 31   Time 4   Period  Weeks   Status Not Met  10/18/14 = 31 (same as eval)   OT SHORT TERM GOAL #3   Title Improve coordination as evidenced by performing 9 hole peg test in 30 sec. or less Rt hand   Baseline eval = 36.60 sec.   Time 4   Period Weeks   Status Not Met  10/18/14 = 39.69 sec.    OT SHORT TERM GOAL #4   Title Pt/family to verbalize understanding w/ CVA warning signs and risk factors   Time 4   Period Weeks   Status Achieved   OT SHORT TERM GOAL #5   Title Pt to perform functional reaching at 115 degrees sh. flexion to retrieve and replace light objects from shelf consistently   Baseline 75% sh. flex (105-110*)   Time 4   Period Weeks   Status Achieved           OT Long Term Goals - 09/25/14 1033    OT LONG TERM GOAL #1   Title Independent w/ updated strengthening HEP (DUE 11/23/14)   Time 8   Period Weeks   Status On-going   OT LONG TERM GOAL #2   Title Improve Rt hand strength to 48 lbs or greater for gripping/lifting tasks   Baseline eval = 40 lbs   Time 8   Period Weeks   Status On-going   OT LONG TERM GOAL #3   Title Pt to return to full IADLS    Baseline eval = only performing simple IADLS   Time 8   Period Weeks   Status On-going   OT LONG TERM GOAL #4   Title Pt to perform financial management tasks at 90% accuracy w/ extra time prn    Baseline eval = dependent d/t aphasia   Time 8   Period Weeks   Status On-going               Plan - 10/25/14 1102    Clinical Impression Statement Pt with fatigue RUE during functional reaching tasks. Pt with weakness posterior shoulder girdle.   Plan neuro re-education, coordination   Consulted and Agree with Plan of Care Patient        Problem List Patient Active Problem List   Diagnosis Date Noted  . Essential hypertension 09/18/2014  . Seizure 07/12/2014  . History of CVA with residual deficit 05/30/2014  . Seizures 05/11/2014  . CVA (cerebral infarction) 05/08/2014  . Diabetes mellitus type 2, controlled  05/08/2014  . Hypothyroidism 05/08/2014  . Hypercholesteremia 11/29/2013  . Status post laparoscopy 11/01/2013  . OSA on CPAP 10/31/2013  . HTN (hypertension) 10/31/2013  . Morbid obesity  10/31/2013  . Leg edema 10/31/2013  . Ovarian cyst 10/31/2013  . Preoperative cardiovascular examination 10/31/2013  . Pelvic mass 10/31/2013  . DM type 2 (diabetes mellitus, type 2) 10/31/2013  . History of CHF (congestive heart failure) 10/31/2013  . Acute renal insufficiency 10/31/2013  . Unspecified hypothyroidism 10/31/2013    Carey Bullocks, OTR/L 10/25/2014, 11:06 AM  [facility] [address] [place], [place], [address] Phone: [phone]   Fax:  [phone]

## 2014-10-25 NOTE — Therapy (Signed)
[facility] [address] [place], [place], [address] Phone: [phone]   Fax:  [phone]  Physical Therapy Treatment  Patient Details  Name: Abigail Johns MRN: [id number] Date of Birth: Apr 14, 1951 Referring Provider:  Prince Solian, MD  Encounter Date: 10/25/2014      PT End of Session - 10/25/14 1212    Visit Number 11   Number of Visits 25   Date for PT Re-Evaluation 11/30/14   PT Start Time 1105   PT Stop Time 1145   PT Time Calculation (min) 40 min   Equipment Utilized During Treatment Gait belt   Activity Tolerance Patient tolerated treatment well   Behavior During Therapy [facility] for tasks assessed/performed      Past Medical History  Diagnosis Date  . CHF (congestive heart failure)   . Hypertension   . High cholesterol   . Thyroid disease   . Diabetes mellitus   . Back pain, chronic   . Renal disorder   . Obesity   . OSA on CPAP   . Edema     Past Surgical History  Procedure Laterality Date  . Nephrolithotomy      Right  . Abdominal hysterectomy    . Eye surgery    . Bunionectomy      both feet  . Left knee surgery      Torn meniscus  . US echocardiography  12/24/2011    mild LVH,mild TR,trace MR,PI  . Laparoscopic ovarian cystectomy Right 11/01/2013    Procedure: LAPAROSCOPIC RIGHT OVARIAN CYSTECTOMY WITH COLLECTION OF WASHINGS ;  Surgeon: Ena Dawley, MD;  Location: [facility];  Service: Gynecology;  Laterality: Right;  . Laparoscopic lysis of adhesions N/A 11/01/2013    Procedure: LAPAROSCOPIC LYSIS OF ADHESIONS;  Surgeon: Ena Dawley, MD;  Location: [facility];  Service: Gynecology;  Laterality: N/A;  . Laparoscopy N/A 11/01/2013    Procedure: LAPAROSCOPY DIAGNOSTIC;  Surgeon: Ena Dawley, MD;  Location: [facility];  Service: Gynecology;  Laterality: N/A;  . Tee without cardioversion N/A 05/11/2014    Procedure: TRANSESOPHAGEAL ECHOCARDIOGRAM (TEE);  Surgeon: Sanda Klein, MD;   Location: [facility];  Service: Cardiovascular;  Laterality: N/A;  . Loop recorder implant N/A 05/11/2014    Procedure: LOOP RECORDER IMPLANT;  Surgeon: Coralyn Mark, MD;  Location: [facility];  Service: Cardiovascular;  Laterality: N/A;    Filed Vitals:   10/25/14 1127  BP: 105/73  Pulse: 89    Visit Diagnosis:  Weakness due to cerebrovascular accident  Abnormality of gait  Balance problems      Subjective Assessment - 10/25/14 1127    Symptoms Fell this morning. See PT note as long discussion to determine factors contributing to fall.   Currently in Pain? Yes   Pain Score 4    Pain Location Knee   Pain Orientation Right;Left   Aggravating Factors  fell onto knees this morning                       [facility] Adult PT Treatment/Exercise - 10/25/14 1100    Transfers   Sit to Stand 5: Supervision;From chair/3-in-1;Without upper extremity assist   Stand to Sit 5: Supervision;To chair/3-in-1;Without upper extremity assist   Ambulation/Gait   Ambulation/Gait Yes   Ambulation/Gait Assistance 5: Supervision   Ambulation Distance (Feet) 100 Feet  100' X 2   Assistive device Large base quad cane   Gait Pattern Step-through pattern;Decreased dorsiflexion - right   Stairs --  Stairs Assistance --   Stair Management Technique --   Number of Stairs --   Ramp --   Curb --   High Level Balance   High Level Balance Activities --   High Level Balance Comments --        Long discussion to determine how she fell. See assessment.        PT Education - 10/25/14 1211    Education provided Yes   Education Details reviewed signs of CVA, systems used to balance and how environment can change balance especially after CVA   Person(s) Educated Patient;Other (comment)  Aunt   Methods Explanation   Comprehension Verbalized understanding          PT Short Term Goals - 10/09/14 1107    PT SHORT TERM GOAL #1   Title demonstrates understanding of updated HEP  (Target Date: 11/02/14)   Time 30   Period Days   Status On-going   PT SHORT TERM GOAL #2   Title Gait Velocity >1.4 ft/sec with single point cane (Target Date: 11/02/14)   Time 30   Period Days   Status On-going   PT SHORT TERM GOAL #3   Title Berg Balance > 45/56 (Target Date: 11/02/14)   Time 30   Period Days   Status On-going   PT SHORT TERM GOAL #4   Title Timed Up & Go <20sec with single point cane (Target Date: 11/02/14)   Time 30   Period Days   Status On-going   PT SHORT TERM GOAL #5   Title ambulates 300' outside including some grass surfaces with single point cane and negotiates ramp /curb with supervision. (Target Date: 11/02/14)   Time 30   Period Days   Status On-going           PT Long Term Goals - 10/09/14 1107    PT LONG TERM GOAL #1   Title demonstrates HEP correctly. (NEW Target Date: 11/30/14)   Baseline partially met 10/02/14 Patient understands current HEP but needs to be progressed.   Time 8   Period Weeks   Status On-going   PT LONG TERM GOAL #2   Title Gait Velocity >1.0 ft/sec target date 3/12/206   Baseline MET 10/02/14 at 1.12 ft/sec   Time 4   Period Weeks   Status Achieved   PT LONG TERM GOAL #3   Title Berg Balance >/= 50/56 (New Target Date: 11/30/14)   Baseline 10/02/14 improved but not met Merrilee Jansky 43/56 (initial was 37/56)   Time 8   Period Weeks   Status On-going   PT LONG TERM GOAL #4   Title Timed Up & Go <18 seconds (New Target Date: 11/30/14)   Baseline 10/01/14 - improved but not met TUG 22.55 sec with single point cane   Time 8   Period Weeks   Status On-going   PT LONG TERM GOAL #5   Title ambulates 300' & negotiates ramp/curb with single point cane modified independent. (New Target Date: 11/30/14)   Baseline 10/02/14 improved but not met - patient ambulates 300' with single point cane with supervision on level surfaces and min A on ramp /curb.   Time 8   Period Weeks   Status On-going   PT LONG TERM GOAL #6   Title Gait Velocity >1.8 ft/sec  (New Target Date: 11/30/14)   Time 8   Period Weeks   Status On-going               Plan -  10/25/14 1213    Clinical Impression Statement patient appears to have fallen while walking in her bedroom without a cane on carpet with obstacles in floor and reaching to open window blinds. She appears to have contusions on knees and left elbow but no significant injury requiring medical attention. Patient & aunt appear to understand if pain increases or other symptoms show up, they need to seek medical attention.   Pt will benefit from skilled therapeutic intervention in order to improve on the following deficits Abnormal gait;Decreased activity tolerance;Decreased balance;Decreased endurance;Decreased mobility;Decreased range of motion;Decreased strength   Rehab Potential Good   PT Frequency 2x / week   PT Duration 8 weeks   PT Treatment/Interventions Therapeutic exercise;ADLs/Self Care Home Management;DME Instruction;Gait training;Stair training;Functional mobility training;Therapeutic activities;Balance training;Neuromuscular re-education;Patient/family education   PT Next Visit Plan Continue gait with SPC over uneven terrain/grass, dynamic balance activities with no UE support.   PT Home Exercise Plan balance Terrence Dupont   Consulted and Agree with Plan of Care Patient;Family member/caregiver   Family Member Consulted aunt        Problem List Patient Active Problem List   Diagnosis Date Noted  . Essential hypertension 09/18/2014  . Seizure 07/12/2014  . History of CVA with residual deficit 05/30/2014  . Seizures 05/11/2014  . CVA (cerebral infarction) 05/08/2014  . Diabetes mellitus type 2, controlled 05/08/2014  . Hypothyroidism 05/08/2014  . Hypercholesteremia 11/29/2013  . Status post laparoscopy 11/01/2013  . OSA on CPAP 10/31/2013  . HTN (hypertension) 10/31/2013  . Morbid obesity 10/31/2013  . Leg edema 10/31/2013  . Ovarian cyst 10/31/2013  . Preoperative cardiovascular  examination 10/31/2013  . Pelvic mass 10/31/2013  . DM type 2 (diabetes mellitus, type 2) 10/31/2013  . History of CHF (congestive heart failure) 10/31/2013  . Acute renal insufficiency 10/31/2013  . Unspecified hypothyroidism 10/31/2013    Proctor Carriker PT, DPT 10/25/2014, 12:17 PM  [facility] [address] [place], [place], [address] Phone: [phone]   Fax:  [phone]

## 2014-10-25 NOTE — Patient Instructions (Signed)
  Please complete the assigned speech therapy homework prior to your next session.  

## 2014-10-25 NOTE — Therapy (Signed)
[facility] [address] [place], [place], [address] Phone: [phone]   Fax:  [phone]  Speech Language Pathology Treatment  Patient Details  Name: Abigail Johns MRN: [id number] Date of Birth: Jan 21, 1951 Referring Provider:  Prince Solian, MD  Encounter Date: 10/25/2014      End of Session - 10/25/14 1005    Visit Number 13   Number of Visits 16   Date for SLP Re-Evaluation 11/08/14   SLP Start Time 0934   SLP Stop Time  1015   SLP Time Calculation (min) 41 min   Activity Tolerance Patient limited by pain;Treatment limited secondary to agitation      Past Medical History  Diagnosis Date  . CHF (congestive heart failure)   . Hypertension   . High cholesterol   . Thyroid disease   . Diabetes mellitus   . Back pain, chronic   . Renal disorder   . Obesity   . OSA on CPAP   . Edema     Past Surgical History  Procedure Laterality Date  . Nephrolithotomy      Right  . Abdominal hysterectomy    . Eye surgery    . Bunionectomy      both feet  . Left knee surgery      Torn meniscus  . US echocardiography  12/24/2011    mild LVH,mild TR,trace MR,PI  . Laparoscopic ovarian cystectomy Right 11/01/2013    Procedure: LAPAROSCOPIC RIGHT OVARIAN CYSTECTOMY WITH COLLECTION OF WASHINGS ;  Surgeon: Ena Dawley, MD;  Location: [facility];  Service: Gynecology;  Laterality: Right;  . Laparoscopic lysis of adhesions N/A 11/01/2013    Procedure: LAPAROSCOPIC LYSIS OF ADHESIONS;  Surgeon: Ena Dawley, MD;  Location: [facility];  Service: Gynecology;  Laterality: N/A;  . Laparoscopy N/A 11/01/2013    Procedure: LAPAROSCOPY DIAGNOSTIC;  Surgeon: Ena Dawley, MD;  Location: [facility];  Service: Gynecology;  Laterality: N/A;  . Tee without cardioversion N/A 05/11/2014    Procedure: TRANSESOPHAGEAL ECHOCARDIOGRAM (TEE);  Surgeon: Sanda Klein, MD;  Location: [facility];  Service: Cardiovascular;  Laterality: N/A;  .  Loop recorder implant N/A 05/11/2014    Procedure: LOOP RECORDER IMPLANT;  Surgeon: Coralyn Mark, MD;  Location: [facility];  Service: Cardiovascular;  Laterality: N/A;    There were no vitals filed for this visit.  Visit Diagnosis: Aphasia  Apraxia      Subjective Assessment - 10/25/14 0941    Symptoms Pt fell this morning in her bedroom. SLP encouraged pt to tell PT of this (PT approintment is not immediately following ST)               ADULT SLP TREATMENT - 10/25/14 0943    General Information   Behavior/Cognition Alert;Cooperative;Pleasant mood   Treatment Provided   Treatment provided Cognitive-Linquistic   Pain Assessment   Pain Assessment 0-10   Pain Score 4    Pain Location both knees   Pain Descriptors / Indicators Sore   Pain Intervention(s) Monitored during session   Cognitive-Linquistic Treatment   Treatment focused on Apraxia;Aphasia   Skilled Treatment Simple picture description completed with rare min A with consistent extra time and restarts/revisions. Pt demo'd understanding of mod complex conversation with occasional min A.  Mod copmlex conversation expressed with frequent restarts and revisions with extra time.   Assessment / Recommendations / Plan   Plan Continue with current plan of care   Progression Toward Goals   Progression toward goals Progressing toward  goals            SLP Short Term Goals - 10/04/14 1328    SLP SHORT TERM GOAL #1   Title pt will complete simple naming with 85% success over three sessions (began 08-09-14)   Status Achieved   SLP SHORT TERM GOAL #2   Title pt will complete functional communication with simple wants/needs via multimodal communication with 80% success and rare min A (began 08-09-14)   Status Deferred  Pt no longer needs   SLP SHORT TERM GOAL #3   Title demo understanding of simple conversation 90% of the time (began 08-09-14)   Status Achieved   SLP SHORT TERM GOAL #4   Title demo understanding of  4-5 sentence spoken information by answering questions with 90% success and occasional min A (began 08-09-14)   Time --   Period --   Status Achieved   SLP SHORT TERM GOAL #5   Title pt will complete HEP with occasional min A (began 08-09-14)   Status Deferred  pt did not receive HEP due to pressence of aphasia          SLP Long Term Goals - 10/25/14 1007    SLP LONG TERM GOAL #1   Title pt will demo understanding of mod complex conversation with occasional min A (began 08-09-14)   Time 2   Period Weeks  or 16 visits)   Status Achieved   SLP LONG TERM GOAL #2   Title pt will use multimodal communication PRN with rare min cues (began 08-09-14)   Time 2   Period Weeks  or 16 visits   Status Revised   SLP LONG TERM GOAL #3   Title pt will complete any HEP with rare min A (began 08-09-14)   Time 2   Period --  or 16 visits   Status On-going  HEP provided 10-18-14          Plan - 10/25/14 1015    Clinical Impression Statement Pt progress/performance today notably hindered by fall this morning - pt tearful about fall. Pt's expressive language in mod complex conversation notably worse than Tuesday and last week.   Speech Therapy Frequency 2x / week   Duration 2 weeks   Treatment/Interventions Language facilitation;Cueing hierarchy;Multimodal communcation approach;Functional tasks;Patient/family education;Compensatory strategies;Internal/external aids;SLP instruction and feedback   Potential to Achieve Goals Good   Potential Considerations Severity of impairments        Problem List Patient Active Problem List   Diagnosis Date Noted  . Essential hypertension 09/18/2014  . Seizure 07/12/2014  . History of CVA with residual deficit 05/30/2014  . Seizures 05/11/2014  . CVA (cerebral infarction) 05/08/2014  . Diabetes mellitus type 2, controlled 05/08/2014  . Hypothyroidism 05/08/2014  . Hypercholesteremia 11/29/2013  . Status post laparoscopy 11/01/2013  . OSA on CPAP  10/31/2013  . HTN (hypertension) 10/31/2013  . Morbid obesity 10/31/2013  . Leg edema 10/31/2013  . Ovarian cyst 10/31/2013  . Preoperative cardiovascular examination 10/31/2013  . Pelvic mass 10/31/2013  . DM type 2 (diabetes mellitus, type 2) 10/31/2013  . History of CHF (congestive heart failure) 10/31/2013  . Acute renal insufficiency 10/31/2013  . Unspecified hypothyroidism 10/31/2013    [facility] , SLP  10/25/2014, 10:16 AM  [facility] [address] [place], [place], [address] Phone: [phone]   Fax:  [phone]

## 2014-10-30 ENCOUNTER — Encounter: Payer: Self-pay | Admitting: Physical Therapy

## 2014-10-30 ENCOUNTER — Ambulatory Visit: Payer: BLUE CROSS/BLUE SHIELD | Admitting: Occupational Therapy

## 2014-10-30 ENCOUNTER — Ambulatory Visit: Payer: BLUE CROSS/BLUE SHIELD

## 2014-10-30 ENCOUNTER — Encounter: Payer: Self-pay | Admitting: Cardiovascular Disease

## 2014-10-30 ENCOUNTER — Ambulatory Visit: Payer: BLUE CROSS/BLUE SHIELD | Attending: Neurology | Admitting: Physical Therapy

## 2014-10-30 DIAGNOSIS — I69359 Hemiplegia and hemiparesis following cerebral infarction affecting unspecified side: Secondary | ICD-10-CM | POA: Diagnosis not present

## 2014-10-30 DIAGNOSIS — R269 Unspecified abnormalities of gait and mobility: Secondary | ICD-10-CM | POA: Insufficient documentation

## 2014-10-30 DIAGNOSIS — R279 Unspecified lack of coordination: Secondary | ICD-10-CM | POA: Insufficient documentation

## 2014-10-30 DIAGNOSIS — IMO0002 Reserved for concepts with insufficient information to code with codable children: Secondary | ICD-10-CM

## 2014-10-30 DIAGNOSIS — M6281 Muscle weakness (generalized): Secondary | ICD-10-CM | POA: Insufficient documentation

## 2014-10-30 DIAGNOSIS — R531 Weakness: Secondary | ICD-10-CM | POA: Diagnosis not present

## 2014-10-30 DIAGNOSIS — I6980 Unspecified sequelae of other cerebrovascular disease: Secondary | ICD-10-CM | POA: Insufficient documentation

## 2014-10-30 DIAGNOSIS — R4701 Aphasia: Secondary | ICD-10-CM | POA: Diagnosis not present

## 2014-10-30 DIAGNOSIS — R482 Apraxia: Secondary | ICD-10-CM | POA: Diagnosis not present

## 2014-10-30 DIAGNOSIS — R2689 Other abnormalities of gait and mobility: Secondary | ICD-10-CM

## 2014-10-30 DIAGNOSIS — I69898 Other sequelae of other cerebrovascular disease: Secondary | ICD-10-CM | POA: Diagnosis not present

## 2014-10-30 DIAGNOSIS — R6889 Other general symptoms and signs: Secondary | ICD-10-CM | POA: Diagnosis not present

## 2014-10-30 NOTE — Therapy (Signed)
[facility] [address] [place], [place], [address] Phone: [phone]   Fax:  [phone]  Speech Language Pathology Treatment  Patient Details  Name: Abigail Johns MRN: [id number] Date of Birth: 02/16/1951 Referring Provider:  Prince Solian, MD  Encounter Date: 10/30/2014      End of Session - 10/30/14 1010    Visit Number 14   Number of Visits 16   Date for SLP Re-Evaluation 11/08/14   SLP Start Time 0934   SLP Stop Time  1015   SLP Time Calculation (min) 41 min   Activity Tolerance Patient tolerated treatment well      Past Medical History  Diagnosis Date  . CHF (congestive heart failure)   . Hypertension   . High cholesterol   . Thyroid disease   . Diabetes mellitus   . Back pain, chronic   . Renal disorder   . Obesity   . OSA on CPAP   . Edema     Past Surgical History  Procedure Laterality Date  . Nephrolithotomy      Right  . Abdominal hysterectomy    . Eye surgery    . Bunionectomy      both feet  . Left knee surgery      Torn meniscus  . US echocardiography  12/24/2011    mild LVH,mild TR,trace MR,PI  . Laparoscopic ovarian cystectomy Right 11/01/2013    Procedure: LAPAROSCOPIC RIGHT OVARIAN CYSTECTOMY WITH COLLECTION OF WASHINGS ;  Surgeon: Ena Dawley, MD;  Location: [facility];  Service: Gynecology;  Laterality: Right;  . Laparoscopic lysis of adhesions N/A 11/01/2013    Procedure: LAPAROSCOPIC LYSIS OF ADHESIONS;  Surgeon: Ena Dawley, MD;  Location: [facility];  Service: Gynecology;  Laterality: N/A;  . Laparoscopy N/A 11/01/2013    Procedure: LAPAROSCOPY DIAGNOSTIC;  Surgeon: Ena Dawley, MD;  Location: [facility];  Service: Gynecology;  Laterality: N/A;  . Tee without cardioversion N/A 05/11/2014    Procedure: TRANSESOPHAGEAL ECHOCARDIOGRAM (TEE);  Surgeon: Sanda Klein, MD;  Location: [facility];  Service: Cardiovascular;  Laterality: N/A;  . Loop recorder implant N/A  05/11/2014    Procedure: LOOP RECORDER IMPLANT;  Surgeon: Coralyn Mark, MD;  Location: [facility];  Service: Cardiovascular;  Laterality: N/A;    There were no vitals filed for this visit.  Visit Diagnosis: Apraxia      Subjective Assessment - 10/30/14 0940    Subjective Pt told PT last visit re: fall.                ADULT SLP TREATMENT - 10/30/14 0942    General Information   Behavior/Cognition Alert;Cooperative;Pleasant mood   Treatment Provided   Treatment provided Cognitive-Linquistic   Pain Assessment   Pain Assessment No/denies pain   Cognitive-Linquistic Treatment   Treatment focused on Apraxia;Aphasia   Skilled Treatment Receptive language during speech tasks req'd min A rarely.  Expressive language tasks- description of pictures and similarities/differences with pictures usual mod A needed; pt intermittent awareness of expressive errors.     Assessment / Recommendations / Plan   Plan Continue with current plan of care   Progression Toward Goals   Progression toward goals Progressing toward goals            SLP Short Term Goals - 10/04/14 1328    SLP SHORT TERM GOAL #1   Title pt will complete simple naming with 85% success over three sessions (began 08-09-14)   Status Achieved   SLP SHORT  TERM GOAL #2   Title pt will complete functional communication with simple wants/needs via multimodal communication with 80% success and rare min A (began 08-09-14)   Status Deferred  Pt no longer needs   SLP SHORT TERM GOAL #3   Title demo understanding of simple conversation 90% of the time (began 08-09-14)   Status Achieved   SLP SHORT TERM GOAL #4   Title demo understanding of 4-5 sentence spoken information by answering questions with 90% success and occasional min A (began 08-09-14)   Time --   Period --   Status Achieved   SLP SHORT TERM GOAL #5   Title pt will complete HEP with occasional min A (began 08-09-14)   Status Deferred  pt did not receive HEP due  to pressence of aphasia          SLP Long Term Goals - 10/30/14 [place] #1   Title pt will demo understanding of mod complex conversation with occasional min A (began 08-09-14)   Time 1   Period Weeks  or 16 visits)   Status Achieved   SLP LONG TERM GOAL #2   Title pt will use multimodal communication PRN with rare min cues (began 08-09-14)   Time 1   Period Weeks  or 16 visits   Status Revised   SLP LONG TERM GOAL #3   Title pt will complete any HEP with rare min A (began 08-09-14)   Time 1   Period --  or 16 visits   Status On-going  HEP provided 10-18-14          Plan - 10/30/14 1010    Clinical Impression Statement Pt with incr'd success with conversation compared to structured tasks.    Speech Therapy Frequency 2x / week   Duration 1 week   Treatment/Interventions Language facilitation;Cueing hierarchy;Multimodal communcation approach;Functional tasks;Patient/family education;Compensatory strategies;Internal/external aids;SLP instruction and feedback   Potential to Achieve Goals Good   Potential Considerations Severity of impairments   Consulted and Agree with Plan of Care Patient        Problem List Patient Active Problem List   Diagnosis Date Noted  . Essential hypertension 09/18/2014  . Seizure 07/12/2014  . History of CVA with residual deficit 05/30/2014  . Seizures 05/11/2014  . CVA (cerebral infarction) 05/08/2014  . Diabetes mellitus type 2, controlled 05/08/2014  . Hypothyroidism 05/08/2014  . Hypercholesteremia 11/29/2013  . Status post laparoscopy 11/01/2013  . OSA on CPAP 10/31/2013  . HTN (hypertension) 10/31/2013  . Morbid obesity 10/31/2013  . Leg edema 10/31/2013  . Ovarian cyst 10/31/2013  . Preoperative cardiovascular examination 10/31/2013  . Pelvic mass 10/31/2013  . DM type 2 (diabetes mellitus, type 2) 10/31/2013  . History of CHF (congestive heart failure) 10/31/2013  . Acute renal insufficiency 10/31/2013  .  Unspecified hypothyroidism 10/31/2013    Garald Balding, SLP 10/30/2014, 10:20 AM  [facility] [address] [place], [place], [address] Phone: [phone]   Fax:  [phone]

## 2014-10-30 NOTE — Patient Instructions (Signed)
  Please complete the assigned speech therapy homework prior to your next session.  

## 2014-10-30 NOTE — Therapy (Signed)
[facility] [address] [place], [place], [address] Phone: [phone]   Fax:  [phone]  Occupational Therapy Treatment  Patient Details  Name: Abigail Johns MRN: [id number] Date of Birth: [birth year] Referring Provider:  Prince Solian, MD  Encounter Date: 10/30/2014      OT End of Session - 10/30/14 1415    Visit Number 9   Number of Visits 17   Date for OT Re-Evaluation 11/23/14   Authorization Type BC[employer]   Authorization Time Period WEEK 6/8   OT Start Time 1022   OT Stop Time 1105   OT Time Calculation (min) 43 min   Activity Tolerance Patient tolerated treatment well      Past Medical History  Diagnosis Date  . CHF (congestive heart failure)   . Hypertension   . High cholesterol   . Thyroid disease   . Diabetes mellitus   . Back pain, chronic   . Renal disorder   . Obesity   . OSA on CPAP   . Edema     Past Surgical History  Procedure Laterality Date  . Nephrolithotomy      Right  . Abdominal hysterectomy    . Eye surgery    . Bunionectomy      both feet  . Left knee surgery      Torn meniscus  . US echocardiography  12/24/2011    mild LVH,mild TR,trace MR,PI  . Laparoscopic ovarian cystectomy Right 11/01/2013    Procedure: LAPAROSCOPIC RIGHT OVARIAN CYSTECTOMY WITH COLLECTION OF WASHINGS ;  Surgeon: Ena Dawley, MD;  Location: [facility];  Service: Gynecology;  Laterality: Right;  . Laparoscopic lysis of adhesions N/A 11/01/2013    Procedure: LAPAROSCOPIC LYSIS OF ADHESIONS;  Surgeon: Ena Dawley, MD;  Location: [facility];  Service: Gynecology;  Laterality: N/A;  . Laparoscopy N/A 11/01/2013    Procedure: LAPAROSCOPY DIAGNOSTIC;  Surgeon: Ena Dawley, MD;  Location: [facility];  Service: Gynecology;  Laterality: N/A;  . Tee without cardioversion N/A 05/11/2014    Procedure: TRANSESOPHAGEAL ECHOCARDIOGRAM (TEE);  Surgeon: Sanda Klein, MD;  Location: [facility];  Service:  Cardiovascular;  Laterality: N/A;  . Loop recorder implant N/A 05/11/2014    Procedure: LOOP RECORDER IMPLANT;  Surgeon: Coralyn Mark, MD;  Location: [facility];  Service: Cardiovascular;  Laterality: N/A;    There were no vitals filed for this visit.  Visit Diagnosis:  Lack of coordination due to stroke  Generalized muscle weakness      Subjective Assessment - 10/30/14 1033    Subjective  "I haven't had any more falls since last week"   Pertinent History CVA 03/2014, another mild CVA 04/2014 but all impairments/deficits from 1st CVA   Patient Stated Goals Get back to work   Currently in Pain? No/denies                    OT Treatments/Exercises (OP) - 10/30/14 1039    Exercises   Exercises Hand   Shoulder Exercises: ROM/Strengthening   UBE (Upper Arm Bike) x 5 min. Level 6 for reciprocal movement and strength/endurance   Hand Exercises   Other Hand Exercises Gripper set at 55 lbs resistance initially but with max drops therefore decreased resistance to 35 lbs resistance to pick up 1" blocks for sustained grip strength. Pt with min to mod drops at 35 lbs resistance.    Fine Motor Coordination   Small Pegboard Placing small pegs in pegboard with occasional min drops. Progressed  to placing O'Connor pegs in pegboard and removing with min difficulty/drops                  OT Short Term Goals - 10/18/14 0947    OT SHORT TERM GOAL #1   Title Independent w/ HEP for coordination and Rt shoulder (due 10/25/14)   Time 4   Period Weeks   Status Achieved   OT SHORT TERM GOAL #2   Title Improve RUE functional use as evidenced by performing 38 blocks or greater on Box & Blocks test    Baseline eval = 31   Time 4   Period Weeks   Status Not Met  10/18/14 = 31 (same as eval)   OT SHORT TERM GOAL #3   Title Improve coordination as evidenced by performing 9 hole peg test in 30 sec. or less Rt hand   Baseline eval = 36.60 sec.   Time 4   Period Weeks   Status Not  Met  10/18/14 = 39.69 sec.    OT SHORT TERM GOAL #4   Title Pt/family to verbalize understanding w/ CVA warning signs and risk factors   Time 4   Period Weeks   Status Achieved   OT SHORT TERM GOAL #5   Title Pt to perform functional reaching at 115 degrees sh. flexion to retrieve and replace light objects from shelf consistently   Baseline 75% sh. flex (105-110*)   Time 4   Period Weeks   Status Achieved           OT Long Term Goals - 10/30/14 1418    OT LONG TERM GOAL #1   Title Independent w/ updated strengthening HEP (DUE 11/23/14)   Time 8   Period Weeks   Status On-going   OT LONG TERM GOAL #2   Title Improve Rt hand strength to 48 lbs or greater for gripping/lifting tasks   Baseline eval = 40 lbs   Time 8   Period Weeks   Status Achieved  Currently = 48 lbs   OT LONG TERM GOAL #3   Title Pt to return to full IADLS    Baseline eval = only performing simple IADLS   Time 8   Period Weeks   Status On-going   OT LONG TERM GOAL #4   Title Pt to perform financial management tasks at 90% accuracy w/ extra time prn    Baseline eval = dependent d/t aphasia   Time 8   Period Weeks   Status On-going               Plan - 10/30/14 1416    Clinical Impression Statement Pt progressing towards goals. Pt fatigues in    Plan high level reaching RUE, Neuro re-education   Consulted and Agree with Plan of Care Patient;Family member/caregiver        Problem List Patient Active Problem List   Diagnosis Date Noted  . Essential hypertension 09/18/2014  . Seizure 07/12/2014  . History of CVA with residual deficit 05/30/2014  . Seizures 05/11/2014  . CVA (cerebral infarction) 05/08/2014  . Diabetes mellitus type 2, controlled 05/08/2014  . Hypothyroidism 05/08/2014  . Hypercholesteremia 11/29/2013  . Status post laparoscopy 11/01/2013  . OSA on CPAP 10/31/2013  . HTN (hypertension) 10/31/2013  . Morbid obesity 10/31/2013  . Leg edema 10/31/2013  . Ovarian cyst  10/31/2013  . Preoperative cardiovascular examination 10/31/2013  . Pelvic mass 10/31/2013  . DM type 2 (diabetes mellitus, type 2) 10/31/2013  .  History of CHF (congestive heart failure) 10/31/2013  . Acute renal insufficiency 10/31/2013  . Unspecified hypothyroidism 10/31/2013    Carey Bullocks, OTR/L 10/30/2014, 2:19 PM  [facility] [address] [place], [place], [address] Phone: [phone]   Fax:  [phone]

## 2014-10-31 NOTE — Therapy (Signed)
[facility] [address] [place], [place], [address] Phone: [phone]   Fax:  [phone]  Physical Therapy Treatment  Patient Details  Name: Abigail Johns MRN: [id number] Date of Birth: 03-03-1951 Referring Provider:  Prince Solian, MD  Encounter Date: 10/30/2014      PT End of Session - 10/30/14 1100    Visit Number 11   Number of Visits 25   Date for PT Re-Evaluation 11/30/14   PT Start Time 1100   PT Stop Time 1145   PT Time Calculation (min) 45 min   Equipment Utilized During Treatment Gait belt   Activity Tolerance Patient tolerated treatment well   Behavior During Therapy [facility] for tasks assessed/performed      Past Medical History  Diagnosis Date  . CHF (congestive heart failure)   . Hypertension   . High cholesterol   . Thyroid disease   . Diabetes mellitus   . Back pain, chronic   . Renal disorder   . Obesity   . OSA on CPAP   . Edema     Past Surgical History  Procedure Laterality Date  . Nephrolithotomy      Right  . Abdominal hysterectomy    . Eye surgery    . Bunionectomy      both feet  . Left knee surgery      Torn meniscus  . US echocardiography  12/24/2011    mild LVH,mild TR,trace MR,PI  . Laparoscopic ovarian cystectomy Right 11/01/2013    Procedure: LAPAROSCOPIC RIGHT OVARIAN CYSTECTOMY WITH COLLECTION OF WASHINGS ;  Surgeon: Ena Dawley, MD;  Location: [facility];  Service: Gynecology;  Laterality: Right;  . Laparoscopic lysis of adhesions N/A 11/01/2013    Procedure: LAPAROSCOPIC LYSIS OF ADHESIONS;  Surgeon: Ena Dawley, MD;  Location: [facility];  Service: Gynecology;  Laterality: N/A;  . Laparoscopy N/A 11/01/2013    Procedure: LAPAROSCOPY DIAGNOSTIC;  Surgeon: Ena Dawley, MD;  Location: [facility];  Service: Gynecology;  Laterality: N/A;  . Tee without cardioversion N/A 05/11/2014    Procedure: TRANSESOPHAGEAL ECHOCARDIOGRAM (TEE);  Surgeon: Sanda Klein, MD;   Location: [facility];  Service: Cardiovascular;  Laterality: N/A;  . Loop recorder implant N/A 05/11/2014    Procedure: LOOP RECORDER IMPLANT;  Surgeon: Coralyn Mark, MD;  Location: [facility];  Service: Cardiovascular;  Laterality: N/A;    There were no vitals filed for this visit.  Visit Diagnosis:  Weakness due to cerebrovascular accident  Abnormality of gait  Balance problems  Activity intolerance      Subjective Assessment - 10/30/14 1108    Subjective no more falls. They picked up clutter items on floor.   Currently in Pain? No/denies            [facility] PT Assessment - 10/30/14 1100    Ambulation/Gait   Gait velocity 2.01 ft/sec  2.01 ft/sec with cane, 1.92 ft/sec without device   Berg Balance Test   Sit to Stand Able to stand without using hands and stabilize independently   Standing Unsupported Able to stand safely 2 minutes   Sitting with Back Unsupported but Feet Supported on Floor or Stool Able to sit safely and securely 2 minutes   Stand to Sit Sits safely with minimal use of hands   Transfers Able to transfer safely, minor use of hands   Standing Unsupported with Eyes Closed Able to stand 10 seconds safely   Standing Ubsupported with Feet Together Able to place feet together independently  and stand 1 minute safely   From Standing, Reach Forward with Outstretched Arm Can reach confidently >25 cm (10")   From Standing Position, Pick up Object from [employer] to pick up shoe safely and easily   From Standing Position, Turn to Look Behind Over each Shoulder Looks behind from both sides and weight shifts well   Turn 360 Degrees Able to turn 360 degrees safely but slowly   Standing Unsupported, Alternately Place Feet on Step/Stool Able to complete 4 steps without aid or supervision   Standing Unsupported, One Foot in [place] to take small step independently and hold 30 seconds   Standing on One Leg Tries to lift leg/unable to hold 3 seconds but remains  standing independently   Total Score 47   Timed Up and Go Test   Normal TUG (seconds) 15.09  15.09 sec without device, 15.15sec with cane                   [facility] Adult PT Treatment/Exercise - 10/30/14 1100    Transfers   Sit to Stand 6: Modified independent (Device/Increase time);Without upper extremity assist;From chair/3-in-1   Stand to Sit 6: Modified independent (Device/Increase time);Without upper extremity assist   Ambulation/Gait   Ambulation/Gait Yes   Ambulation/Gait Assistance 5: Supervision   Ambulation/Gait Assistance Details working on talking & walking, scanning, community with cane & household without device   Ambulation Distance (Feet) 500 Feet  500' & 200' with single point cane, 50' X3 without device   Assistive device Straight cane;None   Gait Pattern Step-through pattern;Decreased dorsiflexion - right   Stairs Yes   Stairs Assistance 5: Supervision   Stair Management Technique One rail Left   Number of Stairs 4   Ramp 5: Supervision  single point cane   Curb 5: Supervision  single point cane                PT Education - 10/30/14 1100    Education provided Yes   Education Details walking in home with daughter without device   Person(s) Educated Patient;Child(ren)   Methods Explanation   Comprehension Verbalized understanding          PT Short Term Goals - 10/30/14 1100    PT SHORT TERM GOAL #1   Title demonstrates understanding of updated HEP (Target Date: 11/02/14)   Time 30   Period Days   Status On-going   PT SHORT TERM GOAL #2   Title Gait Velocity >1.4 ft/sec with single point cane (Target Date: 11/02/14)   Baseline MET 10/30/14 Gait Velocity 2.01 ft/sec with cane and 1.92 ft/sec no device   Time 30   Period Days   Status Achieved   PT SHORT TERM GOAL #3   Title Berg Balance > 45/56 (Target Date: 11/02/14)   Baseline MET 10/30/14 Berg 47/56   Time 30   Period Days   Status Achieved   PT SHORT TERM GOAL #4   Title Timed Up &  Go <20sec with single point cane (Target Date: 11/02/14)   Baseline MET 10/30/14 TUG 15.09sec without device & 15.15sec with cane   Time 30   Period Days   Status Achieved   PT SHORT TERM GOAL #5   Title ambulates 300' outside including some grass surfaces with single point cane and negotiates ramp /curb with supervision. (Target Date: 11/02/14)   Baseline MET 10/30/14   Time 30   Period Days   Status Achieved  PT Long Term Goals - 10/09/14 1107    PT LONG TERM GOAL #1   Title demonstrates HEP correctly. (NEW Target Date: 11/30/14)   Baseline partially met 10/02/14 Patient understands current HEP but needs to be progressed.   Time 8   Period Weeks   Status On-going   PT LONG TERM GOAL #2   Title Gait Velocity >1.0 ft/sec target date 3/12/206   Baseline MET 10/02/14 at 1.12 ft/sec   Time 4   Period Weeks   Status Achieved   PT LONG TERM GOAL #3   Title Berg Balance >/= 50/56 (New Target Date: 11/30/14)   Baseline 10/02/14 improved but not met Merrilee Jansky 43/56 (initial was 37/56)   Time 8   Period Weeks   Status On-going   PT LONG TERM GOAL #4   Title Timed Up & Go <18 seconds (New Target Date: 11/30/14)   Baseline 10/01/14 - improved but not met TUG 22.55 sec with single point cane   Time 8   Period Weeks   Status On-going   PT LONG TERM GOAL #5   Title ambulates 300' & negotiates ramp/curb with single point cane modified independent. (New Target Date: 11/30/14)   Baseline 10/02/14 improved but not met - patient ambulates 300' with single point cane with supervision on level surfaces and min A on ramp /curb.   Time 8   Period Weeks   Status On-going   PT LONG TERM GOAL #6   Title Gait Velocity >1.8 ft/sec (New Target Date: 11/30/14)   Time 8   Period Weeks   Status On-going               Plan - 10/30/14 1100    Clinical Impression Statement Patient appears to have potential to ambulate in community with single point cane and in home without device with further PT. Her Berg  Balance score and Gait Velocity have improved to next level to indicate lower fall risk.   Pt will benefit from skilled therapeutic intervention in order to improve on the following deficits Abnormal gait;Decreased activity tolerance;Decreased balance;Decreased endurance;Decreased mobility;Decreased range of motion;Decreased strength   Rehab Potential Good   PT Frequency 2x / week   PT Duration 8 weeks   PT Treatment/Interventions Therapeutic exercise;ADLs/Self Care Home Management;DME Instruction;Gait training;Stair training;Functional mobility training;Therapeutic activities;Balance training;Neuromuscular re-education;Patient/family education   PT Next Visit Plan Continue community gait with SPC over uneven terrain/grass & household without device, dynamic balance activities with no UE support.   PT Home Exercise Plan balance Terrence Dupont   Consulted and Agree with Plan of Care Patient;Family member/caregiver   Family Member Consulted aunt        Problem List Patient Active Problem List   Diagnosis Date Noted  . Essential hypertension 09/18/2014  . Seizure 07/12/2014  . History of CVA with residual deficit 05/30/2014  . Seizures 05/11/2014  . CVA (cerebral infarction) 05/08/2014  . Diabetes mellitus type 2, controlled 05/08/2014  . Hypothyroidism 05/08/2014  . Hypercholesteremia 11/29/2013  . Status post laparoscopy 11/01/2013  . OSA on CPAP 10/31/2013  . HTN (hypertension) 10/31/2013  . Morbid obesity 10/31/2013  . Leg edema 10/31/2013  . Ovarian cyst 10/31/2013  . Preoperative cardiovascular examination 10/31/2013  . Pelvic mass 10/31/2013  . DM type 2 (diabetes mellitus, type 2) 10/31/2013  . History of CHF (congestive heart failure) 10/31/2013  . Acute renal insufficiency 10/31/2013  . Unspecified hypothyroidism 10/31/2013    Fonda Rochon PT, DPT 10/31/2014, 8:40 AM  [facility]  [facility] [address] [place],  [place], [address] Phone: [phone]   Fax:  [phone]

## 2014-11-01 ENCOUNTER — Ambulatory Visit: Payer: BLUE CROSS/BLUE SHIELD | Admitting: Occupational Therapy

## 2014-11-01 ENCOUNTER — Ambulatory Visit: Payer: BLUE CROSS/BLUE SHIELD | Admitting: Physical Therapy

## 2014-11-01 ENCOUNTER — Ambulatory Visit: Payer: BLUE CROSS/BLUE SHIELD

## 2014-11-07 ENCOUNTER — Ambulatory Visit: Payer: BLUE CROSS/BLUE SHIELD

## 2014-11-07 ENCOUNTER — Ambulatory Visit (INDEPENDENT_AMBULATORY_CARE_PROVIDER_SITE_OTHER): Payer: BLUE CROSS/BLUE SHIELD | Admitting: *Deleted

## 2014-11-07 DIAGNOSIS — I63412 Cerebral infarction due to embolism of left middle cerebral artery: Secondary | ICD-10-CM

## 2014-11-07 LAB — CUP PACEART REMOTE DEVICE CHECK: Date Time Interrogation Session: 20160517135251

## 2014-11-08 ENCOUNTER — Ambulatory Visit: Payer: BLUE CROSS/BLUE SHIELD

## 2014-11-08 ENCOUNTER — Encounter: Payer: BLUE CROSS/BLUE SHIELD | Admitting: Occupational Therapy

## 2014-11-08 DIAGNOSIS — I69898 Other sequelae of other cerebrovascular disease: Secondary | ICD-10-CM | POA: Diagnosis not present

## 2014-11-08 DIAGNOSIS — R482 Apraxia: Secondary | ICD-10-CM

## 2014-11-08 DIAGNOSIS — R4701 Aphasia: Secondary | ICD-10-CM

## 2014-11-08 NOTE — Therapy (Signed)
[facility] [address] [place], [place], [address] Phone: [phone]   Fax:  [phone]  Speech Language Pathology Treatment  Patient Details  Name: Genesi Stefanko MRN: [id number] Date of Birth: 16-May-1951 Referring Provider:  Prince Solian, MD  Encounter Date: 11/08/2014      End of Session - 11/08/14 0918    Visit Number 15   Number of Visits 16   Date for SLP Re-Evaluation 11/08/14   SLP Start Time 0848   SLP Stop Time  0930   SLP Time Calculation (min) 42 min   Activity Tolerance Patient tolerated treatment well      Past Medical History  Diagnosis Date  . CHF (congestive heart failure)   . Hypertension   . High cholesterol   . Thyroid disease   . Diabetes mellitus   . Back pain, chronic   . Renal disorder   . Obesity   . OSA on CPAP   . Edema     Past Surgical History  Procedure Laterality Date  . Nephrolithotomy      Right  . Abdominal hysterectomy    . Eye surgery    . Bunionectomy      both feet  . Left knee surgery      Torn meniscus  . US echocardiography  12/24/2011    mild LVH,mild TR,trace MR,PI  . Laparoscopic ovarian cystectomy Right 11/01/2013    Procedure: LAPAROSCOPIC RIGHT OVARIAN CYSTECTOMY WITH COLLECTION OF WASHINGS ;  Surgeon: Ena Dawley, MD;  Location: [facility];  Service: Gynecology;  Laterality: Right;  . Laparoscopic lysis of adhesions N/A 11/01/2013    Procedure: LAPAROSCOPIC LYSIS OF ADHESIONS;  Surgeon: Ena Dawley, MD;  Location: [facility];  Service: Gynecology;  Laterality: N/A;  . Laparoscopy N/A 11/01/2013    Procedure: LAPAROSCOPY DIAGNOSTIC;  Surgeon: Ena Dawley, MD;  Location: [facility];  Service: Gynecology;  Laterality: N/A;  . Tee without cardioversion N/A 05/11/2014    Procedure: TRANSESOPHAGEAL ECHOCARDIOGRAM (TEE);  Surgeon: Sanda Klein, MD;  Location: [facility];  Service: Cardiovascular;  Laterality: N/A;  . Loop recorder implant N/A  05/11/2014    Procedure: LOOP RECORDER IMPLANT;  Surgeon: Coralyn Mark, MD;  Location: [facility];  Service: Cardiovascular;  Laterality: N/A;    There were no vitals filed for this visit.  Visit Diagnosis: Apraxia  Aphasia      Subjective Assessment - 11/08/14 0850    Subjective Pt reports her talking has been better since last week.               ADULT SLP TREATMENT - 11/08/14 0851    General Information   Behavior/Cognition Alert;Cooperative;Pleasant mood   Treatment Provided   Treatment provided Cognitive-Linquistic   Pain Assessment   Pain Assessment No/denies pain   Cognitive-Linquistic Treatment   Treatment focused on Apraxia;Aphasia   Skilled Treatment Sentence level responses (explaining preposterous sentences) answered with rare mod A. Pt answered functionally with min A rarely. Pt with explanation of different topic than expected with 5 minute clarification and questioning cues consistently necessary. Pt repeated "tongue twisters" with occasional mod A.          SLP Education - 11/08/14 [phone]    Education provided Yes   Education Details HEP (tongue twisters) repetitions changed   Person(s) Educated Patient;Child(ren)   Methods Explanation;Demonstration   Comprehension Returned demonstration;Verbalized understanding;Verbal cues required          SLP Short Term Goals - 10/04/14 1328  SLP SHORT TERM GOAL #1   Title pt will complete simple naming with 85% success over three sessions (began 08-09-14)   Status Achieved   SLP SHORT TERM GOAL #2   Title pt will complete functional communication with simple wants/needs via multimodal communication with 80% success and rare min A (began 08-09-14)   Status Deferred  Pt no longer needs   SLP SHORT TERM GOAL #3   Title demo understanding of simple conversation 90% of the time (began 08-09-14)   Status Achieved   SLP SHORT TERM GOAL #4   Title demo understanding of 4-5 sentence spoken information by  answering questions with 90% success and occasional min A (began 08-09-14)   Time --   Period --   Status Achieved   SLP SHORT TERM GOAL #5   Title pt will complete HEP with occasional min A (began 08-09-14)   Status Deferred  pt did not receive HEP due to pressence of aphasia          SLP Long Term Goals - 11/08/14 7948    SLP LONG TERM GOAL #1   Title pt will demo understanding of mod complex conversation with occasional min A (began 08-09-14)   Period --  or 16 visits)   Status Achieved   SLP LONG TERM GOAL #2   Title pt will use multimodal communication PRN with rare min cues (began 08-09-14)   Period --  or 16 visits   Status Achieved   SLP LONG TERM GOAL #3   Title pt will complete any HEP with rare min A (began 08-09-14)   Time 1   Period --  or 16 visits   Status On-going  HEP provided 10-18-14          Plan - 11/08/14 0939    Clinical Impression Statement Pt remains unaware of all expressive errors. Is functional with extra time with mod complex conversation. Skilled ST needs to continue in order to maximize verbal expression and auditory comprehension   Speech Therapy Frequency 2x / week   Duration 1 week  16 visits total   Treatment/Interventions Language facilitation;Cueing hierarchy;Multimodal communcation approach;Functional tasks;Patient/family education;Compensatory strategies;Internal/external aids;SLP instruction and feedback   Potential to Achieve Goals Good   Potential Considerations Severity of impairments        Problem List Patient Active Problem List   Diagnosis Date Noted  . Essential hypertension 09/18/2014  . Seizure 07/12/2014  . History of CVA with residual deficit 05/30/2014  . Seizures 05/11/2014  . CVA (cerebral infarction) 05/08/2014  . Diabetes mellitus type 2, controlled 05/08/2014  . Hypothyroidism 05/08/2014  . Hypercholesteremia 11/29/2013  . Status post laparoscopy 11/01/2013  . OSA on CPAP 10/31/2013  . HTN (hypertension)  10/31/2013  . Morbid obesity 10/31/2013  . Leg edema 10/31/2013  . Ovarian cyst 10/31/2013  . Preoperative cardiovascular examination 10/31/2013  . Pelvic mass 10/31/2013  . DM type 2 (diabetes mellitus, type 2) 10/31/2013  . History of CHF (congestive heart failure) 10/31/2013  . Acute renal insufficiency 10/31/2013  . Unspecified hypothyroidism 10/31/2013    [facility], SLP 11/08/2014, 9:40 AM  [facility] [address] [place], [place], [address] Phone: [phone]   Fax:  [phone]

## 2014-11-08 NOTE — Patient Instructions (Addendum)
Speech Exercises  Repeat 2 times, 2 times a day  Call the cat "Buttercup" A calendar of [place] Four floors to cover Yellow oil ointment Fellow lovers of felines Catastrophe in [place]' plums The church's chimes chimed Telling time 'til eleven Five valve levers Keep the gate closed Go see that guy Fat cows give milk [employer] Gophers Fat frogs flip freely                  STOP here, for right now ----------------------------------------------------------- ----------------------------------------------------------- Frederik Schmidt into bed Get that game to Greg Thick thistles stick together Cinnamon aluminum linoleum Black bugs blood Lovely lemon linament Red leather, yellow leather  Big grocery buggy    Purple baby carriage [facility] Proper copper coffee pot Ripe purple cabbage Three free throws [employer] tackled  [employer] dipped the dessert  [employer] [place] Dunes that [employer] of [place] Shirts shrink, shells shouldn't [place] [employer] Take the tackle box File the flash message Give me five flapjacks Fundamental relatives Dye the pets purple

## 2014-11-09 NOTE — Progress Notes (Signed)
Loop recorder 

## 2014-11-13 ENCOUNTER — Ambulatory Visit: Payer: BLUE CROSS/BLUE SHIELD

## 2014-11-13 ENCOUNTER — Ambulatory Visit: Payer: BLUE CROSS/BLUE SHIELD | Admitting: Occupational Therapy

## 2014-11-13 DIAGNOSIS — R6889 Other general symptoms and signs: Secondary | ICD-10-CM

## 2014-11-13 DIAGNOSIS — R4701 Aphasia: Secondary | ICD-10-CM

## 2014-11-13 DIAGNOSIS — I69898 Other sequelae of other cerebrovascular disease: Secondary | ICD-10-CM | POA: Diagnosis not present

## 2014-11-13 DIAGNOSIS — R2689 Other abnormalities of gait and mobility: Secondary | ICD-10-CM

## 2014-11-13 DIAGNOSIS — IMO0002 Reserved for concepts with insufficient information to code with codable children: Secondary | ICD-10-CM

## 2014-11-13 DIAGNOSIS — R269 Unspecified abnormalities of gait and mobility: Secondary | ICD-10-CM

## 2014-11-13 DIAGNOSIS — M6281 Muscle weakness (generalized): Secondary | ICD-10-CM

## 2014-11-13 DIAGNOSIS — R482 Apraxia: Secondary | ICD-10-CM

## 2014-11-13 NOTE — Therapy (Signed)
[facility] [address] [place], [place], [address] Phone: [phone]   Fax:  [phone]  Speech Language Pathology Treatment  Patient Details  Name: Abigail Johns MRN: [id number] Date of Birth: 1950-09-27 Referring Provider:  Prince Solian, MD  Encounter Date: 11/13/2014      End of Session - 11/13/14 0933    Visit Number 16   Number of Visits 16   Date for SLP Re-Evaluation 01/11/15   SLP Start Time 0850   SLP Stop Time  0930   SLP Time Calculation (min) 40 min      Past Medical History  Diagnosis Date  . CHF (congestive heart failure)   . Hypertension   . High cholesterol   . Thyroid disease   . Diabetes mellitus   . Back pain, chronic   . Renal disorder   . Obesity   . OSA on CPAP   . Edema     Past Surgical History  Procedure Laterality Date  . Nephrolithotomy      Right  . Abdominal hysterectomy    . Eye surgery    . Bunionectomy      both feet  . Left knee surgery      Torn meniscus  . US echocardiography  12/24/2011    mild LVH,mild TR,trace MR,PI  . Laparoscopic ovarian cystectomy Right 11/01/2013    Procedure: LAPAROSCOPIC RIGHT OVARIAN CYSTECTOMY WITH COLLECTION OF WASHINGS ;  Surgeon: Ena Dawley, MD;  Location: [facility];  Service: Gynecology;  Laterality: Right;  . Laparoscopic lysis of adhesions N/A 11/01/2013    Procedure: LAPAROSCOPIC LYSIS OF ADHESIONS;  Surgeon: Ena Dawley, MD;  Location: [facility];  Service: Gynecology;  Laterality: N/A;  . Laparoscopy N/A 11/01/2013    Procedure: LAPAROSCOPY DIAGNOSTIC;  Surgeon: Ena Dawley, MD;  Location: [facility];  Service: Gynecology;  Laterality: N/A;  . Tee without cardioversion N/A 05/11/2014    Procedure: TRANSESOPHAGEAL ECHOCARDIOGRAM (TEE);  Surgeon: Sanda Klein, MD;  Location: [facility];  Service: Cardiovascular;  Laterality: N/A;  . Loop recorder implant N/A 05/11/2014    Procedure: LOOP RECORDER IMPLANT;  Surgeon:  Coralyn Mark, MD;  Location: [facility];  Service: Cardiovascular;  Laterality: N/A;    There were no vitals filed for this visit.  Visit Diagnosis: Apraxia - Plan: SLP plan of care cert/re-cert  Aphasia - Plan: SLP plan of care cert/re-cert      Subjective Assessment - 11/13/14 0854    Subjective "It's asking - me - to-- speak and then I --- have to earn it - to stay." (pt, re: phrases/sentences e.g. "Tuck Tommy into bed")   Patient is accompained by: Family member  daughter               ADULT SLP TREATMENT - 11/13/14 0857    General Information   Behavior/Cognition Alert;Cooperative;Pleasant mood   Treatment Provided   Treatment provided Cognitive-Linquistic   Pain Assessment   Pain Assessment 0-10   Cognitive-Linquistic Treatment   Treatment focused on Apraxia;Aphasia   Skilled Treatment SLP assessed pt's frustration level with phrases. SLP suggested pt complete phrases x3 attempts then move on. At the end of hte practice time, return to phrases not repeated/said correctly and attempt again x3. Conversation (simple) with approx 80% success with mod questioning cues. SLP had to repeat questions approx 20% of the time for pt to comprehend. Suggested to pt/daughter for pt to slow down when she talks to pt due to pt's decr'd auditory  comprehension.    Assessment / Recommendations / Plan   Plan Continue with current plan of care   Progression Toward Goals   Progression toward goals Progressing toward goals            SLP Short Term Goals - 11/13/14 0944    SLP SHORT TERM GOAL #1   Title pt will complete simple naming with 85% success over three sessions (began 08-09-14)   Status Achieved   SLP SHORT TERM GOAL #2   Title pt will complete functional communication with simple wants/needs via multimodal communication with 80% success and rare min A (began 08-09-14)   Status Deferred  Pt no longer needs   SLP SHORT TERM GOAL #3   Title demo understanding of simple  conversation 90% of the time (began 08-09-14)   Status Achieved   SLP SHORT TERM GOAL #4   Title demo understanding of 4-5 sentence spoken information by answering questions with 90% success and occasional min A (began 08-09-14)   Time 1   Period Weeks   Status Achieved   SLP SHORT TERM GOAL #5   Title pt will complete HEP with usual min A (began 11-13-14)   Time 4   Period Weeks   Status Revised   Additional Short Term Goals   Additional Short Term Goals Yes   SLP SHORT TERM GOAL #6   Title pt will demo understanding of mod complex conversation 90% success with occasional repeats (began 11-13-14)   Time 4   Period Weeks   Status New   SLP SHORT TERM GOAL #7   Title pt will participate in mod complex conversation with 80% success (i.e., functional) with occasional min A (began 11-13-14)   Time 4   Period Weeks   Status New          SLP Long Term Goals - 11/13/14 0940    SLP LONG TERM GOAL #1   Title pt will demo understanding of mod complex conversation with occasional min A (began 08-09-14)   Period --  or 16 visits)   Status Achieved   SLP LONG TERM GOAL #2   Title pt will use multimodal communication PRN with rare min cues (began 08-09-14)   Period --  or 16 visits   Status Achieved   SLP LONG TERM GOAL #3   Title pt will complete any HEP with occasional min A (began 11-13-14)   Time 8   Period Weeks  or 16 visits   Status Revised  HEP provided 10-18-14   SLP LONG TERM GOAL #4   Title pt will demonstrate mod complex conversation 80% success (i.e., functional conversation) with occasional min A (began 11-13-14)   Time 8   Period Weeks   Status New   SLP LONG TERM GOAL #5   Title pt will demo awareness of expressive errors >80% of the time (began 11-13-14)   Time 8   Period Weeks   Status New   Additional Long Term Goals   Additional Long Term Goals Yes   SLP LONG TERM GOAL #6   Title demo understanding of mod complex conversation with 90% success and rare repeats  (began 11-13-14)   Time 8   Period Weeks   Status New          Plan - 11/13/14 1517    Clinical Impression Statement Pt remains unaware of all expressive errors. Is functional with extra time and listener questions with mod complex conversation. Skilled ST needs to continue  in order to maximize verbal expression and auditory comprehension   Speech Therapy Frequency 2x / week   Duration --  8 weeks/16 visits (possibly 4 weeks/8 visits due to progress)   Treatment/Interventions Language facilitation;Cueing hierarchy;Multimodal communcation approach;Functional tasks;Patient/family education;Compensatory strategies;Internal/external aids;SLP instruction and feedback   Potential to Achieve Goals Good   Potential Considerations Severity of impairments   Consulted and Agree with Plan of Care Patient        Problem List Patient Active Problem List   Diagnosis Date Noted  . Essential hypertension 09/18/2014  . Seizure 07/12/2014  . History of CVA with residual deficit 05/30/2014  . Seizures 05/11/2014  . CVA (cerebral infarction) 05/08/2014  . Diabetes mellitus type 2, controlled 05/08/2014  . Hypothyroidism 05/08/2014  . Hypercholesteremia 11/29/2013  . Status post laparoscopy 11/01/2013  . OSA on CPAP 10/31/2013  . HTN (hypertension) 10/31/2013  . Morbid obesity 10/31/2013  . Leg edema 10/31/2013  . Ovarian cyst 10/31/2013  . Preoperative cardiovascular examination 10/31/2013  . Pelvic mass 10/31/2013  . DM type 2 (diabetes mellitus, type 2) 10/31/2013  . History of CHF (congestive heart failure) 10/31/2013  . Acute renal insufficiency 10/31/2013  . Unspecified hypothyroidism 10/31/2013    [facility], SLP 11/13/2014, 11:05 AM  [facility] [address] [place], [place], [address] Phone: [phone]   Fax:  [phone]

## 2014-11-13 NOTE — Therapy (Signed)
[facility] [address] [place], [place], [address] Phone: [phone]   Fax:  [phone]  Physical Therapy Treatment  Patient Details  Name: Abigail Johns MRN: [id number] Date of Birth: Dec 06, 1950 Referring Provider:  Prince Solian, MD  Encounter Date: 11/13/2014      PT End of Session - 11/13/14 2146    Visit Number 12   Number of Visits 25   Date for PT Re-Evaluation 11/30/14   Authorization Type BCBS   PT Start Time 0805  pt arrived late   PT Stop Time 0844   PT Time Calculation (min) 39 min   Equipment Utilized During Treatment Gait belt   Activity Tolerance Patient tolerated treatment well   Behavior During Therapy Flat affect      Past Medical History  Diagnosis Date  . CHF (congestive heart failure)   . Hypertension   . High cholesterol   . Thyroid disease   . Diabetes mellitus   . Back pain, chronic   . Renal disorder   . Obesity   . OSA on CPAP   . Edema     Past Surgical History  Procedure Laterality Date  . Nephrolithotomy      Right  . Abdominal hysterectomy    . Eye surgery    . Bunionectomy      both feet  . Left knee surgery      Torn meniscus  . US echocardiography  12/24/2011    mild LVH,mild TR,trace MR,PI  . Laparoscopic ovarian cystectomy Right 11/01/2013    Procedure: LAPAROSCOPIC RIGHT OVARIAN CYSTECTOMY WITH COLLECTION OF WASHINGS ;  Surgeon: Ena Dawley, MD;  Location: [facility];  Service: Gynecology;  Laterality: Right;  . Laparoscopic lysis of adhesions N/A 11/01/2013    Procedure: LAPAROSCOPIC LYSIS OF ADHESIONS;  Surgeon: Ena Dawley, MD;  Location: [facility];  Service: Gynecology;  Laterality: N/A;  . Laparoscopy N/A 11/01/2013    Procedure: LAPAROSCOPY DIAGNOSTIC;  Surgeon: Ena Dawley, MD;  Location: [facility];  Service: Gynecology;  Laterality: N/A;  . Tee without cardioversion N/A 05/11/2014    Procedure: TRANSESOPHAGEAL ECHOCARDIOGRAM (TEE);  Surgeon:  Sanda Klein, MD;  Location: [facility];  Service: Cardiovascular;  Laterality: N/A;  . Loop recorder implant N/A 05/11/2014    Procedure: LOOP RECORDER IMPLANT;  Surgeon: Coralyn Mark, MD;  Location: [facility];  Service: Cardiovascular;  Laterality: N/A;    There were no vitals filed for this visit.  Visit Diagnosis:  Abnormality of gait  Balance problems  Activity intolerance      Subjective Assessment - 11/13/14 0808    Subjective Pt denied falls or changes since last visit. Pt reported she slept most of the weekend.   Patient Stated Goals Walking better in community.   Currently in Pain? No/denies                         [facility] Adult PT Treatment/Exercise - 11/13/14 0811    Ambulation/Gait   Ambulation/Gait Yes   Ambulation/Gait Assistance 4: Min guard;4: Min assist   Ambulation/Gait Assistance Details VC's to improve R heel strike, stride length and to look straight ahead. Min guard to ensure safety and min A required during 1 LOB episode while turning head to the L side. Pt required seated rest breaks after each bout of ambulation 2/2 R LE fatigue. Min guard to min A to maintain balance outdoors in grass. R heel strike noted to improve after balance  activities at counter.   Ambulation Distance (Feet) --  230'x2 and 100' without SPC indoors, 500' with SPC outdoors   Assistive device Straight cane;None   Gait Pattern Step-through pattern;Decreased dorsiflexion - right   Balance   Balance Assessed Yes   Dynamic Standing Balance   Dynamic Standing - Balance Support Left upper extremity supported   Dynamic Standing - Level of Assistance 4: Min assist;Other (comment)  min guard   Dynamic Standing - Balance Activities Other (comment)   Dynamic Standing - Comments x10/LE: Ant. steps at counter to improve ant/lat weight shifting onto R LE. Alternating step over 1/2" beam x10/LE.                   PT Short Term Goals - 10/30/14 1100    PT SHORT  TERM GOAL #1   Title demonstrates understanding of updated HEP (Target Date: 11/02/14)   Time 30   Period Days   Status On-going   PT SHORT TERM GOAL #2   Title Gait Velocity >1.4 ft/sec with single point cane (Target Date: 11/02/14)   Baseline MET 10/30/14 Gait Velocity 2.01 ft/sec with cane and 1.92 ft/sec no device   Time 30   Period Days   Status Achieved   PT SHORT TERM GOAL #3   Title Berg Balance > 45/56 (Target Date: 11/02/14)   Baseline MET 10/30/14 Berg 47/56   Time 30   Period Days   Status Achieved   PT SHORT TERM GOAL #4   Title Timed Up & Go <20sec with single point cane (Target Date: 11/02/14)   Baseline MET 10/30/14 TUG 15.09sec without device & 15.15sec with cane   Time 30   Period Days   Status Achieved   PT SHORT TERM GOAL #5   Title ambulates 300' outside including some grass surfaces with single point cane and negotiates ramp /curb with supervision. (Target Date: 11/02/14)   Baseline MET 10/30/14   Time 30   Period Days   Status Achieved           PT Long Term Goals - 10/09/14 1107    PT LONG TERM GOAL #1   Title demonstrates HEP correctly. (NEW Target Date: 11/30/14)   Baseline partially met 10/02/14 Patient understands current HEP but needs to be progressed.   Time 8   Period Weeks   Status On-going   PT LONG TERM GOAL #2   Title Gait Velocity >1.0 ft/sec target date 3/12/206   Baseline MET 10/02/14 at 1.12 ft/sec   Time 4   Period Weeks   Status Achieved   PT LONG TERM GOAL #3   Title Berg Balance >/= 50/56 (New Target Date: 11/30/14)   Baseline 10/02/14 improved but not met Merrilee Jansky 43/56 (initial was 37/56)   Time 8   Period Weeks   Status On-going   PT LONG TERM GOAL #4   Title Timed Up & Go <18 seconds (New Target Date: 11/30/14)   Baseline 10/01/14 - improved but not met TUG 22.55 sec with single point cane   Time 8   Period Weeks   Status On-going   PT LONG TERM GOAL #5   Title ambulates 300' & negotiates ramp/curb with single point cane modified independent.  (New Target Date: 11/30/14)   Baseline 10/02/14 improved but not met - patient ambulates 300' with single point cane with supervision on level surfaces and min A on ramp /curb.   Time 8   Period Weeks   Status On-going  PT LONG TERM GOAL #6   Title Gait Velocity >1.8 ft/sec (New Target Date: 11/30/14)   Time 8   Period Weeks   Status On-going               Plan - 11/13/14 2147    Clinical Impression Statement Pt demonstrated progress as she was able to ambulate indoors without a SPC and outdoors with SPC. However, pt did require min A during dynamic gait activites due to LOB. Pt would continue to benefit from skilled PT to improve safety during functional mobility.   Pt will benefit from skilled therapeutic intervention in order to improve on the following deficits Abnormal gait;Decreased activity tolerance;Decreased balance;Decreased endurance;Decreased mobility;Decreased range of motion;Decreased strength   Rehab Potential Good   PT Frequency 2x / week   PT Duration 8 weeks   PT Treatment/Interventions Therapeutic exercise;ADLs/Self Care Home Management;DME Instruction;Gait training;Stair training;Functional mobility training;Therapeutic activities;Balance training;Neuromuscular re-education;Patient/family education   PT Next Visit Plan Continue community gait with SPC over uneven terrain/grass & household without device, dynamic balance activities with no UE support.   PT Home Exercise Plan balance Terrence Dupont   Consulted and Agree with Plan of Care Patient;Family member/caregiver   Family Member Consulted pt's daughter        Problem List Patient Active Problem List   Diagnosis Date Noted  . Essential hypertension 09/18/2014  . Seizure 07/12/2014  . History of CVA with residual deficit 05/30/2014  . Seizures 05/11/2014  . CVA (cerebral infarction) 05/08/2014  . Diabetes mellitus type 2, controlled 05/08/2014  . Hypothyroidism 05/08/2014  . Hypercholesteremia 11/29/2013  .  Status post laparoscopy 11/01/2013  . OSA on CPAP 10/31/2013  . HTN (hypertension) 10/31/2013  . Morbid obesity 10/31/2013  . Leg edema 10/31/2013  . Ovarian cyst 10/31/2013  . Preoperative cardiovascular examination 10/31/2013  . Pelvic mass 10/31/2013  . DM type 2 (diabetes mellitus, type 2) 10/31/2013  . History of CHF (congestive heart failure) 10/31/2013  . Acute renal insufficiency 10/31/2013  . Unspecified hypothyroidism 10/31/2013    Luca Burston L 11/13/2014, 9:50 PM  [facility] [address] [place], [place], [address] Phone: [phone]   Fax:  [phone]     Geoffry Paradise, PT,DPT 11/13/2014 9:50 PM Phone: [phone] Fax: [phone]

## 2014-11-13 NOTE — Therapy (Signed)
[facility] [address] [place], [place], [address] Phone: [phone]   Fax:  [phone]  Occupational Therapy Treatment  Patient Details  Name: Abigail Johns MRN: [id number] Date of Birth: [birth year] Referring Provider:  Prince Solian, MD  Encounter Date: 11/13/2014      OT End of Session - 11/13/14 1241    Visit Number 10   Number of Visits 17   Date for OT Re-Evaluation 11/23/14   Authorization Type BCBS   Authorization Time Period WEEK 7/8   OT Start Time 0932   OT Stop Time 1015   OT Time Calculation (min) 43 min   Activity Tolerance Patient tolerated treatment well      Past Medical History  Diagnosis Date  . CHF (congestive heart failure)   . Hypertension   . High cholesterol   . Thyroid disease   . Diabetes mellitus   . Back pain, chronic   . Renal disorder   . Obesity   . OSA on CPAP   . Edema     Past Surgical History  Procedure Laterality Date  . Nephrolithotomy      Right  . Abdominal hysterectomy    . Eye surgery    . Bunionectomy      both feet  . Left knee surgery      Torn meniscus  . US echocardiography  12/24/2011    mild LVH,mild TR,trace MR,PI  . Laparoscopic ovarian cystectomy Right 11/01/2013    Procedure: LAPAROSCOPIC RIGHT OVARIAN CYSTECTOMY WITH COLLECTION OF WASHINGS ;  Surgeon: Ena Dawley, MD;  Location: [facility];  Service: Gynecology;  Laterality: Right;  . Laparoscopic lysis of adhesions N/A 11/01/2013    Procedure: LAPAROSCOPIC LYSIS OF ADHESIONS;  Surgeon: Ena Dawley, MD;  Location: [facility];  Service: Gynecology;  Laterality: N/A;  . Laparoscopy N/A 11/01/2013    Procedure: LAPAROSCOPY DIAGNOSTIC;  Surgeon: Ena Dawley, MD;  Location: [facility];  Service: Gynecology;  Laterality: N/A;  . Tee without cardioversion N/A 05/11/2014    Procedure: TRANSESOPHAGEAL ECHOCARDIOGRAM (TEE);  Surgeon: Sanda Klein, MD;  Location: [facility];  Service:  Cardiovascular;  Laterality: N/A;  . Loop recorder implant N/A 05/11/2014    Procedure: LOOP RECORDER IMPLANT;  Surgeon: Coralyn Mark, MD;  Location: [facility];  Service: Cardiovascular;  Laterality: N/A;    There were no vitals filed for this visit.  Visit Diagnosis:  Lack of coordination due to stroke  Generalized muscle weakness      Subjective Assessment - 11/13/14 0937    Subjective  No recent falls since the one 3 weeks ago   Pertinent History CVA 03/2014, another mild CVA 04/2014 but all impairments/deficits from 1st CVA   Patient Stated Goals Get back to work   Currently in Pain? No/denies                      OT Treatments/Exercises (OP) - 11/13/14 2353    Shoulder Exercises: ROM/Strengthening   UBE (Upper Arm Bike) x 5 min. Level 3 for reciprocal movement and strength/endurance (going backwards for increased posterior sh. girdle strength)   Neurological Re-education Exercises   Other Exercises 1 Supine: scapula retraction with max tactile cues secondary to ? apraxia vs. decreased proprioception and difficulty following instructions due to aphasia. Pt with noted weakness Rt posterior shoulder girlde.    Other Exercises 2 Prone over physioball for scapula retraction with horizontal abduction with min difficulty and fatigue RUE. Min assist at  end due to fatigue   Functional Reaching Activities   Mid Level Placing medium sized pegs in pegboard vertical surface requiring 90-110* shoulder flexion for functional reaching, endurance, and coordination RUE w/ multiple rest breaks required due to fatigue                  OT Short Term Goals - 10/18/14 0947    OT SHORT TERM GOAL #1   Title Independent w/ HEP for coordination and Rt shoulder (due 10/25/14)   Time 4   Period Weeks   Status Achieved   OT SHORT TERM GOAL #2   Title Improve RUE functional use as evidenced by performing 38 blocks or greater on Box & Blocks test    Baseline eval = 31   Time 4    Period Weeks   Status Not Met  10/18/14 = 31 (same as eval)   OT SHORT TERM GOAL #3   Title Improve coordination as evidenced by performing 9 hole peg test in 30 sec. or less Rt hand   Baseline eval = 36.60 sec.   Time 4   Period Weeks   Status Not Met  10/18/14 = 39.69 sec.    OT SHORT TERM GOAL #4   Title Pt/family to verbalize understanding w/ CVA warning signs and risk factors   Time 4   Period Weeks   Status Achieved   OT SHORT TERM GOAL #5   Title Pt to perform functional reaching at 115 degrees sh. flexion to retrieve and replace light objects from shelf consistently   Baseline 75% sh. flex (105-110*)   Time 4   Period Weeks   Status Achieved           OT Long Term Goals - 10/30/14 1418    OT LONG TERM GOAL #1   Title Independent w/ updated strengthening HEP (DUE 11/23/14)   Time 8   Period Weeks   Status On-going   OT LONG TERM GOAL #2   Title Improve Rt hand strength to 48 lbs or greater for gripping/lifting tasks   Baseline eval = 40 lbs   Time 8   Period Weeks   Status Achieved  Currently = 48 lbs   OT LONG TERM GOAL #3   Title Pt to return to full IADLS    Baseline eval = only performing simple IADLS   Time 8   Period Weeks   Status On-going   OT LONG TERM GOAL #4   Title Pt to perform financial management tasks at 90% accuracy w/ extra time prn    Baseline eval = dependent d/t aphasia   Time 8   Period Weeks   Status On-going               Plan - 11/13/14 1243    Clinical Impression Statement Pt with remaining posterior shoulder girdle weakness and decreased endurance with functional reaching RUE   Plan Begin checking LTG's in prep for d/c week of 11/19/14, grip strength   Consulted and Agree with Plan of Care Patient;Family member/caregiver        Problem List Patient Active Problem List   Diagnosis Date Noted  . Essential hypertension 09/18/2014  . Seizure 07/12/2014  . History of CVA with residual deficit 05/30/2014  .  Seizures 05/11/2014  . CVA (cerebral infarction) 05/08/2014  . Diabetes mellitus type 2, controlled 05/08/2014  . Hypothyroidism 05/08/2014  . Hypercholesteremia 11/29/2013  . Status post laparoscopy 11/01/2013  . OSA on CPAP 10/31/2013  .  HTN (hypertension) 10/31/2013  . Morbid obesity 10/31/2013  . Leg edema 10/31/2013  . Ovarian cyst 10/31/2013  . Preoperative cardiovascular examination 10/31/2013  . Pelvic mass 10/31/2013  . DM type 2 (diabetes mellitus, type 2) 10/31/2013  . History of CHF (congestive heart failure) 10/31/2013  . Acute renal insufficiency 10/31/2013  . Unspecified hypothyroidism 10/31/2013    Carey Bullocks, OTR/L 11/13/2014, 12:46 PM  [facility] [address] [place], [place], [address] Phone: [phone]   Fax:  [phone]

## 2014-11-15 ENCOUNTER — Encounter: Payer: Self-pay | Admitting: Occupational Therapy

## 2014-11-15 ENCOUNTER — Ambulatory Visit: Payer: BLUE CROSS/BLUE SHIELD

## 2014-11-15 ENCOUNTER — Ambulatory Visit: Payer: BLUE CROSS/BLUE SHIELD | Admitting: Occupational Therapy

## 2014-11-15 DIAGNOSIS — IMO0002 Reserved for concepts with insufficient information to code with codable children: Secondary | ICD-10-CM

## 2014-11-15 DIAGNOSIS — R482 Apraxia: Secondary | ICD-10-CM

## 2014-11-15 DIAGNOSIS — R4701 Aphasia: Secondary | ICD-10-CM

## 2014-11-15 DIAGNOSIS — I69898 Other sequelae of other cerebrovascular disease: Secondary | ICD-10-CM | POA: Diagnosis not present

## 2014-11-15 DIAGNOSIS — M6281 Muscle weakness (generalized): Secondary | ICD-10-CM

## 2014-11-15 DIAGNOSIS — I69359 Hemiplegia and hemiparesis following cerebral infarction affecting unspecified side: Secondary | ICD-10-CM

## 2014-11-15 NOTE — Therapy (Signed)
[facility] [address] [place], [place], [address] Phone: [phone]   Fax:  [phone]  Speech Language Pathology Treatment  Patient Details  Name: Abigail Johns MRN: [id number] Date of Birth: 1951/03/22 Referring Provider:  Prince Solian, MD  Encounter Date: 11/15/2014      End of Session - 11/15/14 1049    Visit Number 17   Number of Visits 32   Date for SLP Re-Evaluation 01/11/15   SLP Start Time 66   SLP Stop Time  7654   SLP Time Calculation (min) 29 min      Past Medical History  Diagnosis Date  . CHF (congestive heart failure)   . Hypertension   . High cholesterol   . Thyroid disease   . Diabetes mellitus   . Back pain, chronic   . Renal disorder   . Obesity   . OSA on CPAP   . Edema     Past Surgical History  Procedure Laterality Date  . Nephrolithotomy      Right  . Abdominal hysterectomy    . Eye surgery    . Bunionectomy      both feet  . Left knee surgery      Torn meniscus  . US echocardiography  12/24/2011    mild LVH,mild TR,trace MR,PI  . Laparoscopic ovarian cystectomy Right 11/01/2013    Procedure: LAPAROSCOPIC RIGHT OVARIAN CYSTECTOMY WITH COLLECTION OF WASHINGS ;  Surgeon: Ena Dawley, MD;  Location: [facility];  Service: Gynecology;  Laterality: Right;  . Laparoscopic lysis of adhesions N/A 11/01/2013    Procedure: LAPAROSCOPIC LYSIS OF ADHESIONS;  Surgeon: Ena Dawley, MD;  Location: [facility];  Service: Gynecology;  Laterality: N/A;  . Laparoscopy N/A 11/01/2013    Procedure: LAPAROSCOPY DIAGNOSTIC;  Surgeon: Ena Dawley, MD;  Location: [facility];  Service: Gynecology;  Laterality: N/A;  . Tee without cardioversion N/A 05/11/2014    Procedure: TRANSESOPHAGEAL ECHOCARDIOGRAM (TEE);  Surgeon: Sanda Klein, MD;  Location: [facility];  Service: Cardiovascular;  Laterality: N/A;  . Loop recorder implant N/A 05/11/2014    Procedure: LOOP RECORDER IMPLANT;  Surgeon:  Coralyn Mark, MD;  Location: [facility];  Service: Cardiovascular;  Laterality: N/A;    There were no vitals filed for this visit.  Visit Diagnosis: Apraxia  Aphasia      Subjective Assessment - 11/15/14 1029    Subjective Pt's mother fell Tuesday following pt's tx here. Pt indicated she is anxious about that situation.    Currently in Pain? No/denies               ADULT SLP TREATMENT - 11/15/14 1030    General Information   Behavior/Cognition Alert;Cooperative;Pleasant mood   Treatment Provided   Treatment provided Cognitive-Linquistic   Cognitive-Linquistic Treatment   Treatment focused on Apraxia;Aphasia   Skilled Treatment Pt with explanation of events last two days at home with mod questioning cues from SLP as well as necessary to repeat questions due to pt decr'd auditory processing.  Pt described foods to SLP with usual mod A. Became tearful re: her mother - SLP had to cut session short.   Assessment / Recommendations / Plan   Plan Continue with current plan of care   Progression Toward Goals   Progression toward goals Progressing toward goals            SLP Short Term Goals - 11/13/14 0944    SLP SHORT TERM GOAL #1   Title pt will complete  simple naming with 85% success over three sessions (began 08-09-14)   Status Achieved   SLP SHORT TERM GOAL #2   Title pt will complete functional communication with simple wants/needs via multimodal communication with 80% success and rare min A (began 08-09-14)   Status Deferred  Pt no longer needs   SLP SHORT TERM GOAL #3   Title demo understanding of simple conversation 90% of the time (began 08-09-14)   Status Achieved   SLP SHORT TERM GOAL #4   Title demo understanding of 4-5 sentence spoken information by answering questions with 90% success and occasional min A (began 08-09-14)   Time 1   Period Weeks   Status Achieved   SLP SHORT TERM GOAL #5   Title pt will complete HEP with usual min A (began 11-13-14)    Time 4   Period Weeks   Status Revised   Additional Short Term Goals   Additional Short Term Goals Yes   SLP SHORT TERM GOAL #6   Title pt will demo understanding of mod complex conversation 90% success with occasional repeats (began 11-13-14)   Time 4   Period Weeks   Status New   SLP SHORT TERM GOAL #7   Title pt will participate in mod complex conversation with 80% success (i.e., functional) with occasional min A (began 11-13-14)   Time 4   Period Weeks   Status New          SLP Long Term Goals - 11/13/14 0940    SLP LONG TERM GOAL #1   Title pt will demo understanding of mod complex conversation with occasional min A (began 08-09-14)   Period --  or 16 visits)   Status Achieved   SLP LONG TERM GOAL #2   Title pt will use multimodal communication PRN with rare min cues (began 08-09-14)   Period --  or 16 visits   Status Achieved   SLP LONG TERM GOAL #3   Title pt will complete any HEP with occasional min A (began 11-13-14)   Time 8   Period Weeks  or 16 visits   Status Revised  HEP provided 10-18-14   SLP LONG TERM GOAL #4   Title pt will demonstrate mod complex conversation 80% success (i.e., functional conversation) with occasional min A (began 11-13-14)   Time 8   Period Weeks   Status New   SLP LONG TERM GOAL #5   Title pt will demo awareness of expressive errors >80% of the time (began 11-13-14)   Time 8   Period Weeks   Status New   Additional Long Term Goals   Additional Long Term Goals Yes   SLP LONG TERM GOAL #6   Title demo understanding of mod complex conversation with 90% success and rare repeats (began 11-13-14)   Time 8   Period Weeks   Status New          Plan - 11/15/14 1049    Clinical Impression Statement Pt hindered today due to mother's fall and pt's concern for the situation.        Problem List Patient Active Problem List   Diagnosis Date Noted  . Essential hypertension 09/18/2014  . Seizure 07/12/2014  . History of CVA with  residual deficit 05/30/2014  . Seizures 05/11/2014  . CVA (cerebral infarction) 05/08/2014  . Diabetes mellitus type 2, controlled 05/08/2014  . Hypothyroidism 05/08/2014  . Hypercholesteremia 11/29/2013  . Status post laparoscopy 11/01/2013  . OSA on CPAP 10/31/2013  .  HTN (hypertension) 10/31/2013  . Morbid obesity 10/31/2013  . Leg edema 10/31/2013  . Ovarian cyst 10/31/2013  . Preoperative cardiovascular examination 10/31/2013  . Pelvic mass 10/31/2013  . DM type 2 (diabetes mellitus, type 2) 10/31/2013  . History of CHF (congestive heart failure) 10/31/2013  . Acute renal insufficiency 10/31/2013  . Unspecified hypothyroidism 10/31/2013    SCHINKE,CARL,SLP 11/15/2014, 10:50 AM  [facility] [address] [place], [place], [address] Phone: [phone]   Fax:  [phone]

## 2014-11-15 NOTE — Therapy (Signed)
[facility] [address] [place], [place], [address] Phone: [phone]   Fax:  [phone]  Occupational Therapy Treatment  Patient Details  Name: Abigail Johns MRN: [id number] Date of Birth: [birth year] Referring Provider:  Prince Solian, MD  Encounter Date: 11/15/2014      OT End of Session - 11/15/14 1158    Visit Number 11   Number of Visits 17   Date for OT Re-Evaluation 11/23/14   Authorization Type BCBS   Authorization Time Period WEEK 7/8   OT Start Time 0935   OT Stop Time 1015   OT Time Calculation (min) 40 min   Activity Tolerance Patient tolerated treatment well      Past Medical History  Diagnosis Date  . CHF (congestive heart failure)   . Hypertension   . High cholesterol   . Thyroid disease   . Diabetes mellitus   . Back pain, chronic   . Renal disorder   . Obesity   . OSA on CPAP   . Edema     Past Surgical History  Procedure Laterality Date  . Nephrolithotomy      Right  . Abdominal hysterectomy    . Eye surgery    . Bunionectomy      both feet  . Left knee surgery      Torn meniscus  . US echocardiography  12/24/2011    mild LVH,mild TR,trace MR,PI  . Laparoscopic ovarian cystectomy Right 11/01/2013    Procedure: LAPAROSCOPIC RIGHT OVARIAN CYSTECTOMY WITH COLLECTION OF WASHINGS ;  Surgeon: Ena Dawley, MD;  Location: [facility];  Service: Gynecology;  Laterality: Right;  . Laparoscopic lysis of adhesions N/A 11/01/2013    Procedure: LAPAROSCOPIC LYSIS OF ADHESIONS;  Surgeon: Ena Dawley, MD;  Location: [facility];  Service: Gynecology;  Laterality: N/A;  . Laparoscopy N/A 11/01/2013    Procedure: LAPAROSCOPY DIAGNOSTIC;  Surgeon: Ena Dawley, MD;  Location: [facility];  Service: Gynecology;  Laterality: N/A;  . Tee without cardioversion N/A 05/11/2014    Procedure: TRANSESOPHAGEAL ECHOCARDIOGRAM (TEE);  Surgeon: Sanda Klein, MD;  Location: [facility];  Service:  Cardiovascular;  Laterality: N/A;  . Loop recorder implant N/A 05/11/2014    Procedure: LOOP RECORDER IMPLANT;  Surgeon: Coralyn Mark, MD;  Location: [facility];  Service: Cardiovascular;  Laterality: N/A;    There were no vitals filed for this visit.  Visit Diagnosis:  Hemiparesis affecting dominant side as late effect of cerebrovascular accident  Generalized muscle weakness  Lack of coordination due to stroke      Subjective Assessment - 11/15/14 0937    Subjective  "I fell yesterday, slowly down"   Pertinent History CVA 03/2014, another mild CVA 04/2014 but all impairments/deficits from 1st CVA   Patient Stated Goals Get back to work   Currently in Pain? No/denies            [facility] OT Assessment - 11/15/14 0001    Coordination   Right 9 Hole Peg Test 27.69 sec.    Box and Blocks Rt = 37                   OT Treatments/Exercises (OP) - 11/15/14 1030    ADLs   ADL Comments Assessed remaining unmet STG's and LTG's 2-5 in prep for d/c next week. Pt with increased coordination with 9 hole peg test and increased RUE use demo with increased number of blocks on Box & Blocks test   Neurological Re-education Exercises  Other Exercises 1 Standing: AA/ROM BUE'S in full shoulder flexion along wall using physioball; progressed to scapula stabalization/retraction with full shoulder flexion on ball along wall. AA/ROM RUE along side of wall with forearm in neutral rotation. Min cues provided for correct alignment and positioning. Pt also required min assist for scapula/RUE with full flexion                  OT Short Term Goals - 11/15/14 0948    OT SHORT TERM GOAL #1   Title Independent w/ HEP for coordination and Rt shoulder (due 10/25/14)   Time 4   Period Weeks   Status Achieved   OT SHORT TERM GOAL #2   Title Improve RUE functional use as evidenced by performing 38 blocks or greater on Box & Blocks test    Baseline eval = 31   Time 4   Period Weeks    Status Not Met  10/18/14 = 31 (same as eval), 11/15/14 = 37 blocks   OT SHORT TERM GOAL #3   Title Improve coordination as evidenced by performing 9 hole peg test in 30 sec. or less Rt hand   Baseline eval = 36.60 sec.   Time 4   Period Weeks   Status Achieved  10/18/14 = 39.69 sec., met on 11/15/14 = 27.69 sec.    OT SHORT TERM GOAL #4   Title Pt/family to verbalize understanding w/ CVA warning signs and risk factors   Time 4   Period Weeks   Status Achieved   OT SHORT TERM GOAL #5   Title Pt to perform functional reaching at 115 degrees sh. flexion to retrieve and replace light objects from shelf consistently   Baseline 75% sh. flex (105-110*)   Time 4   Period Weeks   Status Achieved           OT Long Term Goals - 11/15/14 0957    OT LONG TERM GOAL #1   Title Independent w/ updated strengthening HEP (DUE 11/23/14)   Time 8   Period Weeks   Status On-going   OT LONG TERM GOAL #2   Title Improve Rt hand strength to 48 lbs or greater for gripping/lifting tasks   Baseline eval = 40 lbs   Time 8   Period Weeks   Status Achieved  Currently = 48 lbs   OT LONG TERM GOAL #3   Title Pt to return to full IADLS    Baseline eval = only performing simple IADLS   Time 8   Period Weeks   Status Partially Met  Pt assisting with cooking but not performing independently (requires supervision/assist), Pt performing light cleaning   OT LONG TERM GOAL #4   Title Pt to perform financial management tasks at 90% accuracy w/ extra time prn    Baseline eval = dependent d/t aphasia   Time 8   Period Weeks   Status Deferred  due to aphasia               Plan - 11/15/14 1153    Clinical Impression Statement Pt met STG #3, and only 1 block shy of meeting STG #2. Pt partially met LTG #3, deferred LTG #4 due to aphasia   Plan Assess remaining LTG and anticipate d/c week of 11/19/14   Consulted and Agree with Plan of Care Patient        Problem List Patient Active Problem List    Diagnosis Date Noted  . Essential hypertension 09/18/2014  .  Seizure 07/12/2014  . History of CVA with residual deficit 05/30/2014  . Seizures 05/11/2014  . CVA (cerebral infarction) 05/08/2014  . Diabetes mellitus type 2, controlled 05/08/2014  . Hypothyroidism 05/08/2014  . Hypercholesteremia 11/29/2013  . Status post laparoscopy 11/01/2013  . OSA on CPAP 10/31/2013  . HTN (hypertension) 10/31/2013  . Morbid obesity 10/31/2013  . Leg edema 10/31/2013  . Ovarian cyst 10/31/2013  . Preoperative cardiovascular examination 10/31/2013  . Pelvic mass 10/31/2013  . DM type 2 (diabetes mellitus, type 2) 10/31/2013  . History of CHF (congestive heart failure) 10/31/2013  . Acute renal insufficiency 10/31/2013  . Unspecified hypothyroidism 10/31/2013    Carey Bullocks, OTR/L 11/15/2014, 12:12 PM  [facility] [address] [place], [place], [address] Phone: [phone]   Fax:  [phone]

## 2014-11-15 NOTE — Patient Instructions (Signed)
Do some of the speech papers you have at home, over the weekend.

## 2014-11-20 ENCOUNTER — Ambulatory Visit: Payer: BLUE CROSS/BLUE SHIELD | Admitting: Occupational Therapy

## 2014-11-20 ENCOUNTER — Ambulatory Visit: Payer: BLUE CROSS/BLUE SHIELD

## 2014-11-22 ENCOUNTER — Ambulatory Visit: Payer: BLUE CROSS/BLUE SHIELD | Admitting: Occupational Therapy

## 2014-11-22 ENCOUNTER — Ambulatory Visit: Payer: BLUE CROSS/BLUE SHIELD

## 2014-11-22 ENCOUNTER — Encounter: Payer: Self-pay | Admitting: Occupational Therapy

## 2014-11-22 DIAGNOSIS — R482 Apraxia: Secondary | ICD-10-CM

## 2014-11-22 DIAGNOSIS — M6281 Muscle weakness (generalized): Secondary | ICD-10-CM

## 2014-11-22 DIAGNOSIS — R4701 Aphasia: Secondary | ICD-10-CM

## 2014-11-22 DIAGNOSIS — I69898 Other sequelae of other cerebrovascular disease: Secondary | ICD-10-CM | POA: Diagnosis not present

## 2014-11-22 DIAGNOSIS — I69359 Hemiplegia and hemiparesis following cerebral infarction affecting unspecified side: Secondary | ICD-10-CM

## 2014-11-22 NOTE — Patient Instructions (Signed)
1. Grip Strengthening (Resistive Putty)   Squeeze putty using thumb and all fingers. Repeat _20___ times. Do __2__ sessions per day.   2. Roll putty into tube on table and pinch between each finger and thumb x 10 reps each. (can do ring and small finger together)      Strengthening: Resisted Flexion   Hold tubing with _Rt____ arm(s) at side. Pull forward and up. Move shoulder through pain-free range of motion. Repeat __10__ times per set.  Do _1-2_ sessions per day , every other day   Strengthening: Resisted Extension   Hold tubing in __Rt___ hand(s), arm forward. Pull arm back, elbow straight. Repeat _10___ times per set. Do _1-2___ sessions per day, every other day.   Resisted Horizontal Abduction: Bilateral   Sit or stand, tubing in both hands, arms out in front. Keeping arms straight, pinch shoulder blades together and stretch arms out. Repeat _10___ times per set. Do _1-2___ sessions per day, every other day.   Elbow Flexion: Resisted   With tubing held in __Rt____ hand(s) and other end secured under foot, curl arm up as far as possible. Repeat _10___ times per set. Do _1-2___ sessions per day, every other day.    Elbow Extension: Resisted   Sit in chair with resistive band secured at armrest (or hold with other hand) and ___Rt____ elbow bent. Straighten elbow. Repeat _10___ times per set.  Do _1-2___ sessions per day, every other day.   Copyright  VHI. All rights reserved.

## 2014-11-22 NOTE — Therapy (Signed)
[facility] [address] [place], [place], [address] Phone: [phone]   Fax:  [phone]  Occupational Therapy Treatment  Patient Details  Name: Abigail Johns MRN: [id number] Date of Birth: 1950/09/29 Referring Provider:  Prince Solian, MD  Encounter Date: 11/22/2014      OT End of Session - 11/22/14 1621    Visit Number 12   Number of Visits 17   Date for OT Re-Evaluation 11/23/14   Authorization Type BCBS   Authorization Time Period week 8/8   OT Start Time 1450   OT Stop Time 1530   OT Time Calculation (min) 40 min   Activity Tolerance Patient tolerated treatment well      Past Medical History  Diagnosis Date  . CHF (congestive heart failure)   . Hypertension   . High cholesterol   . Thyroid disease   . Diabetes mellitus   . Back pain, chronic   . Renal disorder   . Obesity   . OSA on CPAP   . Edema     Past Surgical History  Procedure Laterality Date  . Nephrolithotomy      Right  . Abdominal hysterectomy    . Eye surgery    . Bunionectomy      both feet  . Left knee surgery      Torn meniscus  . US echocardiography  12/24/2011    mild LVH,mild TR,trace MR,PI  . Laparoscopic ovarian cystectomy Right 11/01/2013    Procedure: LAPAROSCOPIC RIGHT OVARIAN CYSTECTOMY WITH COLLECTION OF WASHINGS ;  Surgeon: Ena Dawley, MD;  Location: [facility];  Service: Gynecology;  Laterality: Right;  . Laparoscopic lysis of adhesions N/A 11/01/2013    Procedure: LAPAROSCOPIC LYSIS OF ADHESIONS;  Surgeon: Ena Dawley, MD;  Location: [facility];  Service: Gynecology;  Laterality: N/A;  . Laparoscopy N/A 11/01/2013    Procedure: LAPAROSCOPY DIAGNOSTIC;  Surgeon: Ena Dawley, MD;  Location: [facility];  Service: Gynecology;  Laterality: N/A;  . Tee without cardioversion N/A 05/11/2014    Procedure: TRANSESOPHAGEAL ECHOCARDIOGRAM (TEE);  Surgeon: Sanda Klein, MD;  Location: [facility];  Service:  Cardiovascular;  Laterality: N/A;  . Loop recorder implant N/A 05/11/2014    Procedure: LOOP RECORDER IMPLANT;  Surgeon: Coralyn Mark, MD;  Location: [facility];  Service: Cardiovascular;  Laterality: N/A;    There were no vitals filed for this visit.  Visit Diagnosis:  Hemiparesis affecting dominant side as late effect of cerebrovascular accident  Generalized muscle weakness      Subjective Assessment - 11/22/14 1453    Subjective  "These are fine" (re: HEP's)   Pertinent History CVA 03/2014, another mild CVA 04/2014 but all impairments/deficits from 1st CVA   Patient Stated Goals Get back to work   Currently in Pain? No/denies                      OT Treatments/Exercises (OP) - 11/22/14 0001    ADLs   ADL Comments Reviewed goals and progress to date with pt and daugther   Shoulder Exercises: ROM/Strengthening   Other ROM/Strengthening Exercises Pt issued theraband HEP for RUE in shoulder flexion, shoulder extension, horizontal abduction, elbow flexion, elbow extension (see pt instructions/enducation). Pt return demo of each x 10 reps   Hand Exercises   Other Hand Exercises Pt issued putty HEP for mass grasp and tip pinch with green resistance. Pt return demo x 10 reps each  OT Education - 11/22/14 1523    Education provided Yes   Education Details Theraband HEP, Putty HEP   Person(s) Educated Patient;Child(ren)   Methods Explanation;Demonstration;Handout   Comprehension Verbalized understanding;Returned demonstration          OT Short Term Goals - 11/15/14 0948    OT SHORT TERM GOAL #1   Title Independent w/ HEP for coordination and Rt shoulder (due 10/25/14)   Time 4   Period Weeks   Status Achieved   OT SHORT TERM GOAL #2   Title Improve RUE functional use as evidenced by performing 38 blocks or greater on Box & Blocks test    Baseline eval = 31   Time 4   Period Weeks   Status Not Met  10/18/14 = 31 (same as eval), 11/15/14  = 37 blocks   OT SHORT TERM GOAL #3   Title Improve coordination as evidenced by performing 9 hole peg test in 30 sec. or less Rt hand   Baseline eval = 36.60 sec.   Time 4   Period Weeks   Status Achieved  10/18/14 = 39.69 sec., met on 11/15/14 = 27.69 sec.    OT SHORT TERM GOAL #4   Title Pt/family to verbalize understanding w/ CVA warning signs and risk factors   Time 4   Period Weeks   Status Achieved   OT SHORT TERM GOAL #5   Title Pt to perform functional reaching at 115 degrees sh. flexion to retrieve and replace light objects from shelf consistently   Baseline 75% sh. flex (105-110*)   Time 4   Period Weeks   Status Achieved           OT Long Term Goals - 11/22/14 1622    OT LONG TERM GOAL #1   Title Independent w/ updated strengthening HEP (DUE 11/23/14)   Time 8   Period Weeks   Status Achieved   OT LONG TERM GOAL #2   Title Improve Rt hand strength to 48 lbs or greater for gripping/lifting tasks   Baseline eval = 40 lbs   Time 8   Period Weeks   Status Achieved  Currently = 48 lbs   OT LONG TERM GOAL #3   Title Pt to return to full IADLS    Baseline eval = only performing simple IADLS   Time 8   Period Weeks   Status Partially Met  Pt assisting with cooking but not performing independently (requires supervision/assist), Pt performing light cleaning   OT LONG TERM GOAL #4   Title Pt to perform financial management tasks at 90% accuracy w/ extra time prn    Baseline eval = dependent d/t aphasia   Time 8   Period Weeks   Status Deferred  due to aphasia               Plan - 11/22/14 1622    Clinical Impression Statement Pt met LTG's #1 and #2, partially met LTG #3. Therapist deferred LTG #4 due to aphasia   Plan D/C O.Donnajean Lopes and Agree with Plan of Care Patient;Family member/caregiver        Problem List Patient Active Problem List   Diagnosis Date Noted  . Essential hypertension 09/18/2014  . Seizure 07/12/2014  . History of CVA  with residual deficit 05/30/2014  . Seizures 05/11/2014  . CVA (cerebral infarction) 05/08/2014  . Diabetes mellitus type 2, controlled 05/08/2014  . Hypothyroidism 05/08/2014  . Hypercholesteremia 11/29/2013  . Status  post laparoscopy 11/01/2013  . OSA on CPAP 10/31/2013  . HTN (hypertension) 10/31/2013  . Morbid obesity 10/31/2013  . Leg edema 10/31/2013  . Ovarian cyst 10/31/2013  . Preoperative cardiovascular examination 10/31/2013  . Pelvic mass 10/31/2013  . DM type 2 (diabetes mellitus, type 2) 10/31/2013  . History of CHF (congestive heart failure) 10/31/2013  . Acute renal insufficiency 10/31/2013  . Unspecified hypothyroidism 10/31/2013     OCCUPATIONAL THERAPY DISCHARGE SUMMARY  Visits from Start of Care: 12  Current functional level related to goals / functional outcomes: SEE ABOVE   Remaining deficits: Strength RUE Coordination RUE   Education / Equipment: Pt provided w/ CVA education; coordination, putty, cane and theraband HEP's  Plan: Patient agrees to discharge.  Patient goals were partially met. Patient is being discharged due to being pleased with the current functional level.  ?????       Carey Bullocks, OTR/L 11/22/2014, 4:25 PM  [facility] [address] [place], [place], [address] Phone: [phone]   Fax:  [phone]

## 2014-11-23 NOTE — Therapy (Signed)
[facility] [address] [place], [place], [address] Phone: [phone]   Fax:  [phone]  Speech Language Pathology Treatment  Patient Details  Name: Abigail Johns MRN: [id number] Date of Birth: 29-Apr-1951 Referring Provider:  Prince Solian, MD  Encounter Date: 11/22/2014      End of Session - 11/23/14 0846    Visit Number 18   Number of Visits 32   Date for SLP Re-Evaluation 01/11/15   SLP Start Time 5631   SLP Stop Time  4970   SLP Time Calculation (min) 45 min   Activity Tolerance Patient tolerated treatment well      Past Medical History  Diagnosis Date  . CHF (congestive heart failure)   . Hypertension   . High cholesterol   . Thyroid disease   . Diabetes mellitus   . Back pain, chronic   . Renal disorder   . Obesity   . OSA on CPAP   . Edema     Past Surgical History  Procedure Laterality Date  . Nephrolithotomy      Right  . Abdominal hysterectomy    . Eye surgery    . Bunionectomy      both feet  . Left knee surgery      Torn meniscus  . US echocardiography  12/24/2011    mild LVH,mild TR,trace MR,PI  . Laparoscopic ovarian cystectomy Right 11/01/2013    Procedure: LAPAROSCOPIC RIGHT OVARIAN CYSTECTOMY WITH COLLECTION OF WASHINGS ;  Surgeon: Ena Dawley, MD;  Location: [facility];  Service: Gynecology;  Laterality: Right;  . Laparoscopic lysis of adhesions N/A 11/01/2013    Procedure: LAPAROSCOPIC LYSIS OF ADHESIONS;  Surgeon: Ena Dawley, MD;  Location: [facility];  Service: Gynecology;  Laterality: N/A;  . Laparoscopy N/A 11/01/2013    Procedure: LAPAROSCOPY DIAGNOSTIC;  Surgeon: Ena Dawley, MD;  Location: [facility];  Service: Gynecology;  Laterality: N/A;  . Tee without cardioversion N/A 05/11/2014    Procedure: TRANSESOPHAGEAL ECHOCARDIOGRAM (TEE);  Surgeon: Sanda Klein, MD;  Location: [facility];  Service: Cardiovascular;  Laterality: N/A;  . Loop recorder implant N/A  05/11/2014    Procedure: LOOP RECORDER IMPLANT;  Surgeon: Coralyn Mark, MD;  Location: [facility];  Service: Cardiovascular;  Laterality: N/A;    There were no vitals filed for this visit.  Visit Diagnosis: Aphasia  Apraxia      Subjective Assessment - 11/22/14 1545    Subjective Pt explained she has had stomach bug  "She playin' the ball..." (pt trying to tell SLP pt's mother fell)               ADULT SLP TREATMENT - 11/22/14 1538    General Information   Behavior/Cognition Alert;Cooperative;Pleasant mood   Treatment Provided   Treatment provided Cognitive-Linquistic   Pain Assessment   Pain Assessment No/denies pain   Cognitive-Linquistic Treatment   Treatment focused on Apraxia;Aphasia   Skilled Treatment Complex conversation about pt's mother non-functional. SLP facilitated session to address compensations: writing was challenging for pt at higher than word levels. Pt reported she had stomach flu the last three days and explained that with mod questioning cues from SLP usually.   Assessment / Recommendations / Plan   Plan Continue with current plan of care   Progression Toward Goals   Progression toward goals Not progressing toward goals (comment)  suspect flu the last few days played role with this            SLP Short  Term Goals - 11/22/14 0849    SLP SHORT TERM GOAL #1   Title pt will complete simple naming with 85% success over three sessions (began 08-09-14)   Status Achieved   SLP SHORT TERM GOAL #2   Title pt will complete functional communication with simple wants/needs via multimodal communication with 80% success and rare min A (began 08-09-14)   Status Deferred  Pt no longer needs   SLP SHORT TERM GOAL #3   Title demo understanding of simple conversation 90% of the time (began 08-09-14)   Status Achieved   SLP SHORT TERM GOAL #4   Title demo understanding of 4-5 sentence spoken information by answering questions with 90% success and occasional  min A (began 08-09-14)   Time 3   Period Weeks   Status Achieved   SLP SHORT TERM GOAL #5   Title pt will complete HEP with usual min A (began 11-13-14)   Time 3   Period Weeks   Status Revised   SLP SHORT TERM GOAL #6   Title pt will demo understanding of mod complex conversation 90% success with occasional repeats (began 11-13-14)   Time 4   Period Weeks   Status On-going   SLP SHORT TERM GOAL #7   Title pt will participate in mod complex conversation with 80% success (i.e., functional) with occasional min A (began 11-13-14)   Time 4   Period Weeks   Status On-going          SLP Long Term Goals - 11/23/14 0849    SLP LONG TERM GOAL #1   Title pt will demo understanding of mod complex conversation with occasional min A (began 08-09-14)   Period --  or 16 visits)   Status Achieved   SLP LONG TERM GOAL #2   Title pt will use multimodal communication PRN with rare min cues (began 08-09-14)   Period --  or 16 visits   Status Achieved   SLP LONG TERM GOAL #3   Title pt will complete any HEP with occasional min A (began 11-13-14)   Time 7   Period Weeks  or 16 visits   Status Revised  HEP provided 10-18-14   SLP LONG TERM GOAL #4   Title pt will demonstrate mod complex conversation 80% success (i.e., functional conversation) with occasional min A (began 11-13-14)   Time 7   Period Weeks   Status On-going   SLP LONG TERM GOAL #5   Title pt will demo awareness of expressive errors >80% of the time (began 11-13-14)   Time 7   Period Weeks   Status On-going   SLP LONG TERM GOAL #6   Title demo understanding of mod complex conversation with 90% success and rare repeats (began 11-13-14)   Time 7   Period Weeks   Status On-going          Plan - 11/23/14 0847    Clinical Impression Statement Pt hindered today due to sickness the past three days. Pt with minimal change from last session last week. Suspect pt has not practiced as directed since last session, certainly the last  three days.   Speech Therapy Frequency 2x / week   Duration --  7 weeks, or 15 remaining visits   Treatment/Interventions Language facilitation;Cueing hierarchy;Multimodal communcation approach;Functional tasks;Patient/family education;Compensatory strategies;Internal/external aids;SLP instruction and feedback   Potential to Achieve Goals Good   Potential Considerations Severity of impairments        Problem List Patient Active Problem  List   Diagnosis Date Noted  . Essential hypertension 09/18/2014  . Seizure 07/12/2014  . History of CVA with residual deficit 05/30/2014  . Seizures 05/11/2014  . CVA (cerebral infarction) 05/08/2014  . Diabetes mellitus type 2, controlled 05/08/2014  . Hypothyroidism 05/08/2014  . Hypercholesteremia 11/29/2013  . Status post laparoscopy 11/01/2013  . OSA on CPAP 10/31/2013  . HTN (hypertension) 10/31/2013  . Morbid obesity 10/31/2013  . Leg edema 10/31/2013  . Ovarian cyst 10/31/2013  . Preoperative cardiovascular examination 10/31/2013  . Pelvic mass 10/31/2013  . DM type 2 (diabetes mellitus, type 2) 10/31/2013  . History of CHF (congestive heart failure) 10/31/2013  . Acute renal insufficiency 10/31/2013  . Unspecified hypothyroidism 10/31/2013    [facility], SLP 11/23/2014, 8:51 AM  [facility] [address] [place], [place], [address] Phone: [phone]   Fax:  [phone]

## 2014-11-27 ENCOUNTER — Ambulatory Visit: Payer: BLUE CROSS/BLUE SHIELD | Admitting: Physical Therapy

## 2014-11-27 ENCOUNTER — Ambulatory Visit: Payer: BLUE CROSS/BLUE SHIELD

## 2014-11-29 ENCOUNTER — Encounter: Payer: Self-pay | Admitting: Physical Therapy

## 2014-11-29 ENCOUNTER — Ambulatory Visit: Payer: BLUE CROSS/BLUE SHIELD | Attending: Neurology | Admitting: Physical Therapy

## 2014-11-29 ENCOUNTER — Ambulatory Visit: Payer: BLUE CROSS/BLUE SHIELD

## 2014-11-29 DIAGNOSIS — I6980 Unspecified sequelae of other cerebrovascular disease: Secondary | ICD-10-CM | POA: Diagnosis not present

## 2014-11-29 DIAGNOSIS — M6281 Muscle weakness (generalized): Secondary | ICD-10-CM | POA: Insufficient documentation

## 2014-11-29 DIAGNOSIS — R6889 Other general symptoms and signs: Secondary | ICD-10-CM

## 2014-11-29 DIAGNOSIS — R531 Weakness: Secondary | ICD-10-CM | POA: Insufficient documentation

## 2014-11-29 DIAGNOSIS — R4701 Aphasia: Secondary | ICD-10-CM | POA: Insufficient documentation

## 2014-11-29 DIAGNOSIS — R482 Apraxia: Secondary | ICD-10-CM | POA: Insufficient documentation

## 2014-11-29 DIAGNOSIS — R2689 Other abnormalities of gait and mobility: Secondary | ICD-10-CM

## 2014-11-29 DIAGNOSIS — I69359 Hemiplegia and hemiparesis following cerebral infarction affecting unspecified side: Secondary | ICD-10-CM | POA: Insufficient documentation

## 2014-11-29 DIAGNOSIS — IMO0002 Reserved for concepts with insufficient information to code with codable children: Secondary | ICD-10-CM

## 2014-11-29 DIAGNOSIS — R269 Unspecified abnormalities of gait and mobility: Secondary | ICD-10-CM

## 2014-11-29 DIAGNOSIS — R279 Unspecified lack of coordination: Secondary | ICD-10-CM | POA: Diagnosis not present

## 2014-11-29 DIAGNOSIS — I69898 Other sequelae of other cerebrovascular disease: Secondary | ICD-10-CM | POA: Diagnosis present

## 2014-11-29 NOTE — Therapy (Signed)
[facility] [address] [place], [place], [address] Phone: [phone]   Fax:  [phone]  Speech Language Pathology Treatment  Patient Details  Name: Abigail Johns MRN: [id number] Date of Birth: October 25, 1950 Referring Provider:  Prince Solian, MD  Encounter Date: 11/29/2014      End of Session - 11/29/14 1028    Visit Number 19   Number of Visits 32   Date for SLP Re-Evaluation 01/11/15   SLP Start Time 66   SLP Stop Time  1100   SLP Time Calculation (min) 40 min   Activity Tolerance Patient tolerated treatment well      Past Medical History  Diagnosis Date  . CHF (congestive heart failure)   . Hypertension   . High cholesterol   . Thyroid disease   . Diabetes mellitus   . Back pain, chronic   . Renal disorder   . Obesity   . OSA on CPAP   . Edema     Past Surgical History  Procedure Laterality Date  . Nephrolithotomy      Right  . Abdominal hysterectomy    . Eye surgery    . Bunionectomy      both feet  . Left knee surgery      Torn meniscus  . US echocardiography  12/24/2011    mild LVH,mild TR,trace MR,PI  . Laparoscopic ovarian cystectomy Right 11/01/2013    Procedure: LAPAROSCOPIC RIGHT OVARIAN CYSTECTOMY WITH COLLECTION OF WASHINGS ;  Surgeon: Ena Dawley, MD;  Location: [facility];  Service: Gynecology;  Laterality: Right;  . Laparoscopic lysis of adhesions N/A 11/01/2013    Procedure: LAPAROSCOPIC LYSIS OF ADHESIONS;  Surgeon: Ena Dawley, MD;  Location: [facility];  Service: Gynecology;  Laterality: N/A;  . Laparoscopy N/A 11/01/2013    Procedure: LAPAROSCOPY DIAGNOSTIC;  Surgeon: Ena Dawley, MD;  Location: [facility];  Service: Gynecology;  Laterality: N/A;  . Tee without cardioversion N/A 05/11/2014    Procedure: TRANSESOPHAGEAL ECHOCARDIOGRAM (TEE);  Surgeon: Sanda Klein, MD;  Location: [facility];  Service: Cardiovascular;  Laterality: N/A;  . Loop recorder implant N/A  05/11/2014    Procedure: LOOP RECORDER IMPLANT;  Surgeon: Coralyn Mark, MD;  Location: [facility];  Service: Cardiovascular;  Laterality: N/A;    There were no vitals filed for this visit.  Visit Diagnosis: Apraxia  Aphasia      Subjective Assessment - 11/29/14 1025    Subjective "Where did the - woman (daughter) go?"    Patient is accompained by: Family member  dtr, Lauren               ADULT SLP TREATMENT - 11/29/14 1026    General Information   Behavior/Cognition Alert;Cooperative;Pleasant mood   Treatment Provided   Treatment provided Cognitive-Linquistic   Pain Assessment   Pain Assessment No/denies pain   Cognitive-Linquistic Treatment   Treatment focused on Apraxia;Aphasia   Skilled Treatment Reveiewed tongue twisters with usual mod A. Pt reproted she is not practicing these. SLP reiterated pt needs to do them at LEAST once/day. Opposites provided with 88% success - SLP provided rare min A for 100% success. DEscriptions (sentence) with usual min-mod A for succes in comprehension. Pt aware of errors 60% of the time in strucutred tasks. Demonstrated aphasic errors today, mostly semantic errors approx 25% of the time.   Assessment / Recommendations / Plan   Plan Continue with current plan of care   Progression Toward Goals   Progression toward goals Progressing  toward goals            SLP Short Term Goals - 11/29/14 1029    SLP SHORT TERM GOAL #1   Title pt will complete simple naming with 85% success over three sessions (began 08-09-14)   Status Achieved   SLP SHORT TERM GOAL #2   Title pt will complete functional communication with simple wants/needs via multimodal communication with 80% success and rare min A (began 08-09-14)   Status Deferred  Pt no longer needs   SLP SHORT TERM GOAL #3   Title demo understanding of simple conversation 90% of the time (began 08-09-14)   Status Achieved   SLP SHORT TERM GOAL #4   Title demo understanding of 4-5  sentence spoken information by answering questions with 90% success and occasional min A (began 08-09-14)   Status Achieved   SLP SHORT TERM GOAL #5   Title pt will complete HEP with usual min A (began 11-13-14)   Time 2   Period Weeks   Status Revised   SLP SHORT TERM GOAL #6   Title pt will demo understanding of mod complex conversation 90% success with occasional repeats (began 11-13-14)   Time 2   Period Weeks   Status On-going   SLP SHORT TERM GOAL #7   Title pt will participate in mod complex conversation with 80% success (i.e., functional) with occasional min A (began 11-13-14)   Time 2   Period Weeks   Status On-going          SLP Long Term Goals - 11/29/14 1130    SLP LONG TERM GOAL #1   Title pt will demo understanding of mod complex conversation with occasional min A (began 08-09-14)   Period --  or 16 visits)   Status Achieved   SLP LONG TERM GOAL #2   Title pt will use multimodal communication PRN with rare min cues (began 08-09-14)   Period --  or 16 visits   Status Achieved   SLP LONG TERM GOAL #3   Title pt will complete any HEP with occasional min A (began 11-13-14)   Time 6   Period Weeks  or 16 visits   Status Revised  HEP provided 10-18-14   SLP LONG TERM GOAL #4   Title pt will demonstrate mod complex conversation 80% success (i.e., functional conversation) with occasional min A (began 11-13-14)   Time 6   Period Weeks   Status On-going   SLP LONG TERM GOAL #5   Title pt will demo awareness of expressive errors >80% of the time (began 11-13-14)   Time 6   Period Weeks   Status On-going   SLP LONG TERM GOAL #6   Title demo understanding of mod complex conversation with 90% success and rare repeats (began 11-13-14)   Time 6   Period Weeks   Status On-going          Plan - 11/29/14 1105    Clinical Impression Statement Pt's performance with structured tasks at the sentence level cont as deficient, but sentence communication is functional in simple  conversation (only). Word level with structured tasks are functional. Complications with daughter's health may be making pt's practice challenging at times.   Speech Therapy Frequency 2x / week   Duration --  6 weeks, or 14 remaining visits   Treatment/Interventions Language facilitation;Cueing hierarchy;Multimodal communcation approach;Functional tasks;Patient/family education;Compensatory strategies;Internal/external aids;SLP instruction and feedback   Potential to Achieve Goals Good   Potential Considerations Severity of  impairments        Problem List Patient Active Problem List   Diagnosis Date Noted  . Essential hypertension 09/18/2014  . Seizure 07/12/2014  . History of CVA with residual deficit 05/30/2014  . Seizures 05/11/2014  . CVA (cerebral infarction) 05/08/2014  . Diabetes mellitus type 2, controlled 05/08/2014  . Hypothyroidism 05/08/2014  . Hypercholesteremia 11/29/2013  . Status post laparoscopy 11/01/2013  . OSA on CPAP 10/31/2013  . HTN (hypertension) 10/31/2013  . Morbid obesity 10/31/2013  . Leg edema 10/31/2013  . Ovarian cyst 10/31/2013  . Preoperative cardiovascular examination 10/31/2013  . Pelvic mass 10/31/2013  . DM type 2 (diabetes mellitus, type 2) 10/31/2013  . History of CHF (congestive heart failure) 10/31/2013  . Acute renal insufficiency 10/31/2013  . Unspecified hypothyroidism 10/31/2013    [facility], SLP 11/29/2014, 11:33 AM  [facility] [address] [place], [place], [address] Phone: [phone]   Fax:  [phone]

## 2014-11-29 NOTE — Patient Instructions (Signed)
Please do the tongue twisters at LEAST once a day! They are helping your brain tell your mouth what to do when you talk in conversation.

## 2014-11-29 NOTE — Therapy (Signed)
[facility] [address] [place], [place], [address] Phone: [phone]   Fax:  [phone]  Physical Therapy Treatment  Patient Details  Name: Abigail Johns MRN: [id number] Date of Birth: [birth year] Referring Provider:  Prince Solian, MD  Encounter Date: 11/29/2014      PT End of Session - 11/29/14 0930    Visit Number 13   Number of Visits 25   Date for PT Re-Evaluation 12/14/14   Authorization Type [employer]   PT Start Time 0935   PT Stop Time 1015   PT Time Calculation (min) 40 min   Equipment Utilized During Treatment Gait belt   Activity Tolerance Patient tolerated treatment well   Behavior During Therapy Flat affect      Past Medical History  Diagnosis Date  . CHF (congestive heart failure)   . Hypertension   . High cholesterol   . Thyroid disease   . Diabetes mellitus   . Back pain, chronic   . Renal disorder   . Obesity   . OSA on CPAP   . Edema     Past Surgical History  Procedure Laterality Date  . Nephrolithotomy      Right  . Abdominal hysterectomy    . Eye surgery    . Bunionectomy      both feet  . Left knee surgery      Torn meniscus  . US echocardiography  12/24/2011    mild LVH,mild TR,trace MR,PI  . Laparoscopic ovarian cystectomy Right 11/01/2013    Procedure: LAPAROSCOPIC RIGHT OVARIAN CYSTECTOMY WITH COLLECTION OF WASHINGS ;  Surgeon: Ena Dawley, MD;  Location: [facility];  Service: Gynecology;  Laterality: Right;  . Laparoscopic lysis of adhesions N/A 11/01/2013    Procedure: LAPAROSCOPIC LYSIS OF ADHESIONS;  Surgeon: Ena Dawley, MD;  Location: [facility];  Service: Gynecology;  Laterality: N/A;  . Laparoscopy N/A 11/01/2013    Procedure: LAPAROSCOPY DIAGNOSTIC;  Surgeon: Ena Dawley, MD;  Location: [facility];  Service: Gynecology;  Laterality: N/A;  . Tee without cardioversion N/A 05/11/2014    Procedure: TRANSESOPHAGEAL ECHOCARDIOGRAM (TEE);  Surgeon: Sanda Klein, MD;   Location: [facility];  Service: Cardiovascular;  Laterality: N/A;  . Loop recorder implant N/A 05/11/2014    Procedure: LOOP RECORDER IMPLANT;  Surgeon: Coralyn Mark, MD;  Location: [facility];  Service: Cardiovascular;  Laterality: N/A;    There were no vitals filed for this visit.  Visit Diagnosis:  Weakness due to cerebrovascular accident  Activity intolerance  Balance problems  Abnormality of gait      Subjective Assessment - 11/29/14 0940    Subjective No falls or issues.   Currently in Pain? No/denies      Gait Training: Patient ambulated 400' with single point cane with SBA working on "talking & walking" and scanning environment with SBA. Patient ambulated 15' X 5 and 50' X 1 without device with SBA /  Cues on posture & step length.  Sit to stand to walking without hestitncy in parallel bars for safety but no support with SBA. Neuromuscular Re-Ed: Standing with feet together with eyes open & closed on floor head movements laterally, up/down & diagonals with SBA. Sidestepping, forward walking with Long steps and backwards gait, turning 360 with larger steps without UE assist with SBA.      [facility] PT Assessment - 11/29/14 0930    Berg Balance Test   Sit to Stand Able to stand without using hands and stabilize independently  Standing Unsupported Able to stand safely 2 minutes   Sitting with Back Unsupported but Feet Supported on Floor or Stool Able to sit safely and securely 2 minutes   Stand to Sit Sits safely with minimal use of hands   Transfers Able to transfer safely, minor use of hands   Standing Unsupported with Eyes Closed Able to stand 10 seconds safely   Standing Ubsupported with Feet Together Able to place feet together independently and stand 1 minute safely   From Standing, Reach Forward with Outstretched Arm Can reach confidently >25 cm (10")   From Standing Position, Pick up Object from Floor Able to pick up shoe safely and easily   From Standing  Position, Turn to Look Behind Over each Shoulder Looks behind from both sides and weight shifts well   Turn 360 Degrees Able to turn 360 degrees safely but slowly   Standing Unsupported, Alternately Place Feet on Step/Stool Able to complete 4 steps without aid or supervision   Standing Unsupported, One Foot in Front Able to plae foot ahead of the other independently and hold 30 seconds   Standing on One Leg Able to lift leg independently and hold equal to or more than 3 seconds   Total Score 49                             PT Education - 11/29/14 0930    Education provided Yes   Education Details activity level progression and consistency   Person(s) Educated Patient   Methods Explanation   Comprehension Verbalized understanding          PT Short Term Goals - 10/30/14 1100    PT SHORT TERM GOAL #1   Title demonstrates understanding of updated HEP (Target Date: 11/02/14)   Time 30   Period Days   Status On-going   PT SHORT TERM GOAL #2   Title Gait Velocity >1.4 ft/sec with single point cane (Target Date: 11/02/14)   Baseline MET 10/30/14 Gait Velocity 2.01 ft/sec with cane and 1.92 ft/sec no device   Time 30   Period Days   Status Achieved   PT SHORT TERM GOAL #3   Title Berg Balance > 45/56 (Target Date: 11/02/14)   Baseline MET 10/30/14 Berg 47/56   Time 30   Period Days   Status Achieved   PT SHORT TERM GOAL #4   Title Timed Up & Go <20sec with single point cane (Target Date: 11/02/14)   Baseline MET 10/30/14 TUG 15.09sec without device & 15.15sec with cane   Time 30   Period Days   Status Achieved   PT SHORT TERM GOAL #5   Title ambulates 300' outside including some grass surfaces with single point cane and negotiates ramp /curb with supervision. (Target Date: 11/02/14)   Baseline MET 10/30/14   Time 30   Period Days   Status Achieved           PT Long Term Goals - 11/29/14 0930    PT LONG TERM GOAL #1   Title demonstrates HEP correctly. (NEW Target  Date: 12/14/14)   Baseline partially met 10/02/14 Patient understands current HEP but needs to be progressed.   Time 8   Period Weeks   Status On-going   PT LONG TERM GOAL #2   Title Gait Velocity >1.0 ft/sec target date 3/12/206   Baseline MET 10/02/14 at 1.12 ft/sec   Time 4   Period Weeks  Status Achieved   PT LONG TERM GOAL #3   Title Berg Balance >/= 50/56 (New Target Date: 12/14/14)   Baseline 10/02/14 improved but not met Merrilee Jansky 43/56 (initial was 37/56)   Time 8   Period Weeks   Status On-going   PT LONG TERM GOAL #4   Title Timed Up & Go <18 seconds (New Target Date: 12/14/14)   Baseline 10/01/14 - improved but not met TUG 22.55 sec with single point cane   Time 8   Period Weeks   Status On-going   PT LONG TERM GOAL #5   Title ambulates 300' & negotiates ramp/curb with single point cane modified independent. (New Target Date: 12/14/14)   Baseline 10/02/14 improved but not met - patient ambulates 300' with single point cane with supervision on level surfaces and min A on ramp /curb.   Time 8   Period Weeks   Status On-going   PT LONG TERM GOAL #6   Title Gait Velocity >1.8 ft/sec (New Target Date: 12/14/14)   Time 8   Period Weeks   Status On-going               Plan - 11/29/14 0930    Clinical Impression Statement Patient's was recerified for 8 addtional weeks of PT after 10/02/2014 but this is only the 6th week of care. Patient has 2 more weeks on plan of care and appears to need those 2 weeks to meet her LTGs / functional potential. PT updated Target Dates to reflect 2 addtional weeks of care. Patient was able improve movement speeds and balance with instruction & repetition.   Pt will benefit from skilled therapeutic intervention in order to improve on the following deficits Abnormal gait;Decreased activity tolerance;Decreased balance;Decreased endurance;Decreased mobility;Decreased range of motion;Decreased strength   Rehab Potential Good   PT Frequency 2x / week   PT  Duration 8 weeks   PT Treatment/Interventions Therapeutic exercise;ADLs/Self Care Home Management;DME Instruction;Gait training;Stair training;Functional mobility training;Therapeutic activities;Balance training;Neuromuscular re-education;Patient/family education   PT Next Visit Plan Continue community gait with SPC over uneven terrain/grass & household without device, dynamic balance activities with no UE support.   PT Home Exercise Plan balance Terrence Dupont   Consulted and Agree with Plan of Care Patient;Family member/caregiver   Family Member Consulted pt's daughter        Problem List Patient Active Problem List   Diagnosis Date Noted  . Essential hypertension 09/18/2014  . Seizure 07/12/2014  . History of CVA with residual deficit 05/30/2014  . Seizures 05/11/2014  . CVA (cerebral infarction) 05/08/2014  . Diabetes mellitus type 2, controlled 05/08/2014  . Hypothyroidism 05/08/2014  . Hypercholesteremia 11/29/2013  . Status post laparoscopy 11/01/2013  . OSA on CPAP 10/31/2013  . HTN (hypertension) 10/31/2013  . Morbid obesity 10/31/2013  . Leg edema 10/31/2013  . Ovarian cyst 10/31/2013  . Preoperative cardiovascular examination 10/31/2013  . Pelvic mass 10/31/2013  . DM type 2 (diabetes mellitus, type 2) 10/31/2013  . History of CHF (congestive heart failure) 10/31/2013  . Acute renal insufficiency 10/31/2013  . Unspecified hypothyroidism 10/31/2013    Jamey Reas PT, DPT 11/29/2014, 10:48 AM  [facility] [address] [place], [place], [address] Phone: [phone]   Fax:  [phone]

## 2014-12-04 ENCOUNTER — Ambulatory Visit: Payer: BLUE CROSS/BLUE SHIELD

## 2014-12-04 ENCOUNTER — Encounter: Payer: Self-pay | Admitting: Physical Therapy

## 2014-12-04 ENCOUNTER — Ambulatory Visit: Payer: BLUE CROSS/BLUE SHIELD | Admitting: Physical Therapy

## 2014-12-04 DIAGNOSIS — R4701 Aphasia: Secondary | ICD-10-CM

## 2014-12-04 DIAGNOSIS — IMO0002 Reserved for concepts with insufficient information to code with codable children: Secondary | ICD-10-CM

## 2014-12-04 DIAGNOSIS — R6889 Other general symptoms and signs: Secondary | ICD-10-CM

## 2014-12-04 DIAGNOSIS — R269 Unspecified abnormalities of gait and mobility: Secondary | ICD-10-CM

## 2014-12-04 DIAGNOSIS — R2689 Other abnormalities of gait and mobility: Secondary | ICD-10-CM

## 2014-12-04 DIAGNOSIS — R482 Apraxia: Secondary | ICD-10-CM

## 2014-12-04 DIAGNOSIS — I69898 Other sequelae of other cerebrovascular disease: Secondary | ICD-10-CM | POA: Diagnosis not present

## 2014-12-04 NOTE — Therapy (Signed)
[facility] [address] [place], [place], [address] Phone: [phone]   Fax:  [phone]  Speech Language Pathology Treatment  Patient Details  Name: Abigail Johns MRN: [id number] Date of Birth: 09/13/1950 Referring Provider:  Prince Solian, MD  Encounter Date: 12/04/2014      End of Session - 12/04/14 1109    Visit Number 20   Number of Visits 32   Date for SLP Re-Evaluation 01/11/15   SLP Start Time 44   SLP Stop Time  65   SLP Time Calculation (min) 40 min   Activity Tolerance Patient tolerated treatment well      Past Medical History  Diagnosis Date  . CHF (congestive heart failure)   . Hypertension   . High cholesterol   . Thyroid disease   . Diabetes mellitus   . Back pain, chronic   . Renal disorder   . Obesity   . OSA on CPAP   . Edema     Past Surgical History  Procedure Laterality Date  . Nephrolithotomy      Right  . Abdominal hysterectomy    . Eye surgery    . Bunionectomy      both feet  . Left knee surgery      Torn meniscus  . US echocardiography  12/24/2011    mild LVH,mild TR,trace MR,PI  . Laparoscopic ovarian cystectomy Right 11/01/2013    Procedure: LAPAROSCOPIC RIGHT OVARIAN CYSTECTOMY WITH COLLECTION OF WASHINGS ;  Surgeon: Ena Dawley, MD;  Location: [facility];  Service: Gynecology;  Laterality: Right;  . Laparoscopic lysis of adhesions N/A 11/01/2013    Procedure: LAPAROSCOPIC LYSIS OF ADHESIONS;  Surgeon: Ena Dawley, MD;  Location: [facility];  Service: Gynecology;  Laterality: N/A;  . Laparoscopy N/A 11/01/2013    Procedure: LAPAROSCOPY DIAGNOSTIC;  Surgeon: Ena Dawley, MD;  Location: [facility];  Service: Gynecology;  Laterality: N/A;  . Tee without cardioversion N/A 05/11/2014    Procedure: TRANSESOPHAGEAL ECHOCARDIOGRAM (TEE);  Surgeon: Sanda Klein, MD;  Location: [facility];  Service: Cardiovascular;  Laterality: N/A;  . Loop recorder implant N/A  05/11/2014    Procedure: LOOP RECORDER IMPLANT;  Surgeon: Coralyn Mark, MD;  Location: [facility];  Service: Cardiovascular;  Laterality: N/A;    There were no vitals filed for this visit.  Visit Diagnosis: Aphasia  Apraxia      Subjective Assessment - 12/04/14 1027    Subjective "(My talking) been ball sotes - with them all."  (Speech has been OK since last session)               ADULT SLP TREATMENT - 12/04/14 1028    General Information   Behavior/Cognition Alert;Cooperative;Pleasant mood   Treatment Provided   Treatment provided Cognitive-Linquistic   Pain Assessment   Pain Assessment No/denies pain   Cognitive-Linquistic Treatment   Treatment focused on Apraxia;Aphasia   Skilled Treatment Simple conversation today re: weekend activities usual questioning cues from SLP due to frequency of apraxic errors. 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 Pt had to abandon some utterances due to apraxia. Opposites: verbal and semantic cues needed occasionally. SLP changed tasks due to pt difficulty with simple conversation - Word level responses: simple and mod complex opposites- 50% success with verbal and semantic cues req'd occasionally. Sentence responses (definitions) req'd verbal, semantic, and demo cues usually.    Assessment / Recommendations / Plan   Plan Continue with current  plan of care   Progression Toward Goals   Progression toward goals Not progressing toward goals (comment)  more difficulty now with same tasks as two weeks ago            SLP Short Term Goals - 12/04/14 1320    SLP SHORT TERM GOAL #1   Title pt will complete simple naming with 85% success over three sessions (began 08-09-14)   Status Achieved   SLP SHORT TERM GOAL #2   Title pt will complete functional communication with simple wants/needs via multimodal communication with 80% success and rare min A (began 08-09-14)    Status Deferred  Pt no longer needs   SLP SHORT TERM GOAL #3   Title demo understanding of simple conversation 90% of the time (began 08-09-14)   Status Achieved   SLP SHORT TERM GOAL #4   Title demo understanding of 4-5 sentence spoken information by answering questions with 90% success and occasional min A (began 08-09-14)   Status Achieved   SLP SHORT TERM GOAL #5   Title pt will complete HEP with usual min A (began 11-13-14)   Time 2   Period Weeks   Status Revised   SLP SHORT TERM GOAL #6   Title pt will demo understanding of mod complex conversation 90% success with occasional repeats (began 11-13-14)   Time 2   Period Weeks   Status On-going   SLP SHORT TERM GOAL #7   Title pt will participate in mod complex conversation with 80% success (i.e., functional) with occasional min A (began 11-13-14)   Time 2   Period Weeks   Status On-going          SLP Long Term Goals - 12/04/14 1320    SLP LONG TERM GOAL #1   Title pt will demo understanding of mod complex conversation with occasional min A (began 08-09-14)   Period --  or 16 visits)   Status Achieved   SLP LONG TERM GOAL #2   Title pt will use multimodal communication PRN with rare min cues (began 08-09-14)   Period --  or 16 visits   Status Achieved   SLP LONG TERM GOAL #3   Title pt will complete any HEP with occasional min A (began 11-13-14)   Time 5   Period Weeks  or 14 visits   Status Revised  HEP provided 10-18-14   SLP LONG TERM GOAL #4   Title pt will demonstrate mod complex conversation 80% success (i.e., functional conversation) with occasional min A (began 11-13-14)   Time 5   Period Weeks   Status On-going   SLP LONG TERM GOAL #5   Title pt will demo awareness of expressive errors >80% of the time (began 11-13-14)   Time 5   Period Weeks   Status On-going   SLP LONG TERM GOAL #6   Title demo understanding of mod complex conversation with 90% success and rare repeats (began 11-13-14)   Time 5   Period  Weeks   Status On-going          Plan - 12/04/14 1109    Clinical Impression Statement Pt's performance in the last two weeks has decreased when compared to a month ago. Highly suspect daughter's and mother's health is primarily to blame. Consider discharge in 1-2 weeks.   Speech Therapy Frequency 2x / week   Duration --  5 weeks, or 13 remaining visits   Treatment/Interventions Language facilitation;Cueing hierarchy;Multimodal communcation approach;Functional tasks;Patient/family education;Compensatory strategies;Internal/external  aids;SLP instruction and feedback   Potential to Achieve Goals Fair   Potential Considerations Severity of impairments;Other (comment)  family situaiton        Problem List Patient Active Problem List   Diagnosis Date Noted  . Essential hypertension 09/18/2014  . Seizure 07/12/2014  . History of CVA with residual deficit 05/30/2014  . Seizures 05/11/2014  . CVA (cerebral infarction) 05/08/2014  . Diabetes mellitus type 2, controlled 05/08/2014  . Hypothyroidism 05/08/2014  . Hypercholesteremia 11/29/2013  . Status post laparoscopy 11/01/2013  . OSA on CPAP 10/31/2013  . HTN (hypertension) 10/31/2013  . Morbid obesity 10/31/2013  . Leg edema 10/31/2013  . Ovarian cyst 10/31/2013  . Preoperative cardiovascular examination 10/31/2013  . Pelvic mass 10/31/2013  . DM type 2 (diabetes mellitus, type 2) 10/31/2013  . History of CHF (congestive heart failure) 10/31/2013  . Acute renal insufficiency 10/31/2013  . Unspecified hypothyroidism 10/31/2013    Garald Balding, SLP 12/04/2014, 2:13 PM  [facility] [address] [place], [place], [address] Phone: [phone]   Fax:  [phone]

## 2014-12-04 NOTE — Therapy (Signed)
[facility] [address] [place], [place], [address] Phone: [phone]   Fax:  [phone]  Physical Therapy Treatment  Patient Details  Name: Abigail Johns MRN: [id number] Date of Birth: 03-27-1951 Referring Provider:  Prince Solian, MD  Encounter Date: 12/04/2014      PT End of Session - 12/04/14 1100    Visit Number 14   Number of Visits 25   Date for PT Re-Evaluation 12/14/14   Authorization Type BCBS   PT Start Time 1105   PT Stop Time 1147   PT Time Calculation (min) 42 min   Equipment Utilized During Treatment Gait belt   Activity Tolerance Patient tolerated treatment well   Behavior During Therapy Flat affect      Past Medical History  Diagnosis Date  . CHF (congestive heart failure)   . Hypertension   . High cholesterol   . Thyroid disease   . Diabetes mellitus   . Back pain, chronic   . Renal disorder   . Obesity   . OSA on CPAP   . Edema     Past Surgical History  Procedure Laterality Date  . Nephrolithotomy      Right  . Abdominal hysterectomy    . Eye surgery    . Bunionectomy      both feet  . Left knee surgery      Torn meniscus  . US echocardiography  12/24/2011    mild LVH,mild TR,trace MR,PI  . Laparoscopic ovarian cystectomy Right 11/01/2013    Procedure: LAPAROSCOPIC RIGHT OVARIAN CYSTECTOMY WITH COLLECTION OF WASHINGS ;  Surgeon: Ena Dawley, MD;  Location: [facility];  Service: Gynecology;  Laterality: Right;  . Laparoscopic lysis of adhesions N/A 11/01/2013    Procedure: LAPAROSCOPIC LYSIS OF ADHESIONS;  Surgeon: Ena Dawley, MD;  Location: [facility];  Service: Gynecology;  Laterality: N/A;  . Laparoscopy N/A 11/01/2013    Procedure: LAPAROSCOPY DIAGNOSTIC;  Surgeon: Ena Dawley, MD;  Location: [facility];  Service: Gynecology;  Laterality: N/A;  . Tee without cardioversion N/A 05/11/2014    Procedure: TRANSESOPHAGEAL ECHOCARDIOGRAM (TEE);  Surgeon: Sanda Klein, MD;   Location: [facility];  Service: Cardiovascular;  Laterality: N/A;  . Loop recorder implant N/A 05/11/2014    Procedure: LOOP RECORDER IMPLANT;  Surgeon: Coralyn Mark, MD;  Location: [facility];  Service: Cardiovascular;  Laterality: N/A;    There were no vitals filed for this visit.  Visit Diagnosis:  Weakness due to cerebrovascular accident  Activity intolerance  Balance problems  Abnormality of gait      Subjective Assessment - 12/04/14 1110    Subjective no falls or issues. Limited walking due to daughter's knee injury.   Currently in Pain? No/denies     Gait Training: Patient ambulated 700' with single point cane with tripod tip scanning environment and conversing while ambulating with SBA including ramps & curbs. Stairs reciprocal with 2 rails with verbal cues. End of session, pt ambulated 150' with single point cane with PT manual cueing to increase gait velocity.   Neuromuscular Re-ed: Balance Activities with PT demo, instructing movement patterns with SBA, near counter without touching: Marching forward and backward, side stepping, abduction with external rotation reciprocal, stepping behind opposite foot (pre-motion for turning around), turning 180 degrees, walking 3'-10' and turning 180 degrees to walk other direction, stepping over 8" hurdles  PT Short Term Goals - 10/30/14 1100    PT SHORT TERM GOAL #1   Title demonstrates understanding of updated HEP (Target Date: 11/02/14)   Time 30   Period Days   Status On-going   PT SHORT TERM GOAL #2   Title Gait Velocity >1.4 ft/sec with single point cane (Target Date: 11/02/14)   Baseline MET 10/30/14 Gait Velocity 2.01 ft/sec with cane and 1.92 ft/sec no device   Time 30   Period Days   Status Achieved   PT SHORT TERM GOAL #3   Title Berg Balance > 45/56 (Target Date: 11/02/14)   Baseline MET 10/30/14 Berg 47/56   Time 30   Period Days   Status Achieved   PT SHORT TERM  GOAL #4   Title Timed Up & Go <20sec with single point cane (Target Date: 11/02/14)   Baseline MET 10/30/14 TUG 15.09sec without device & 15.15sec with cane   Time 30   Period Days   Status Achieved   PT SHORT TERM GOAL #5   Title ambulates 300' outside including some grass surfaces with single point cane and negotiates ramp /curb with supervision. (Target Date: 11/02/14)   Baseline MET 10/30/14   Time 30   Period Days   Status Achieved           PT Long Term Goals - 11/29/14 0930    PT LONG TERM GOAL #1   Title demonstrates HEP correctly. (NEW Target Date: 12/14/14)   Baseline partially met 10/02/14 Patient understands current HEP but needs to be progressed.   Time 8   Period Weeks   Status On-going   PT LONG TERM GOAL #2   Title Gait Velocity >1.0 ft/sec target date 3/12/206   Baseline MET 10/02/14 at 1.12 ft/sec   Time 4   Period Weeks   Status Achieved   PT LONG TERM GOAL #3   Title Berg Balance >/= 50/56 (New Target Date: 12/14/14)   Baseline 10/02/14 improved but not met Merrilee Jansky 43/56 (initial was 37/56)   Time 8   Period Weeks   Status On-going   PT LONG TERM GOAL #4   Title Timed Up & Go <18 seconds (New Target Date: 12/14/14)   Baseline 10/01/14 - improved but not met TUG 22.55 sec with single point cane   Time 8   Period Weeks   Status On-going   PT LONG TERM GOAL #5   Title ambulates 300' & negotiates ramp/curb with single point cane modified independent. (New Target Date: 12/14/14)   Baseline 10/02/14 improved but not met - patient ambulates 300' with single point cane with supervision on level surfaces and min A on ramp /curb.   Time 8   Period Weeks   Status On-going   PT LONG TERM GOAL #6   Title Gait Velocity >1.8 ft/sec (New Target Date: 12/14/14)   Time 8   Period Weeks   Status On-going               Plan - 12/04/14 1100    Clinical Impression Statement Patient appeared more emotional today at beginning of session but was able to engage in session to make  gains. Patient improved movement pattern with direction changes with instruction & repetition.   Pt will benefit from skilled therapeutic intervention in order to improve on the following deficits Abnormal gait;Decreased activity tolerance;Decreased balance;Decreased endurance;Decreased mobility;Decreased range of motion;Decreased strength   Rehab Potential Good   PT Frequency 2x / week   PT  Duration 8 weeks   PT Treatment/Interventions Therapeutic exercise;ADLs/Self Care Home Management;DME Instruction;Gait training;Stair training;Functional mobility training;Therapeutic activities;Balance training;Neuromuscular re-education;Patient/family education   PT Next Visit Plan Continue community gait with SPC over uneven terrain/grass & household without device, dynamic balance activities with no UE support.   PT Home Exercise Plan balance Terrence Dupont   Consulted and Agree with Plan of Care Patient        Problem List Patient Active Problem List   Diagnosis Date Noted  . Essential hypertension 09/18/2014  . Seizure 07/12/2014  . History of CVA with residual deficit 05/30/2014  . Seizures 05/11/2014  . CVA (cerebral infarction) 05/08/2014  . Diabetes mellitus type 2, controlled 05/08/2014  . Hypothyroidism 05/08/2014  . Hypercholesteremia 11/29/2013  . Status post laparoscopy 11/01/2013  . OSA on CPAP 10/31/2013  . HTN (hypertension) 10/31/2013  . Morbid obesity 10/31/2013  . Leg edema 10/31/2013  . Ovarian cyst 10/31/2013  . Preoperative cardiovascular examination 10/31/2013  . Pelvic mass 10/31/2013  . DM type 2 (diabetes mellitus, type 2) 10/31/2013  . History of CHF (congestive heart failure) 10/31/2013  . Acute renal insufficiency 10/31/2013  . Unspecified hypothyroidism 10/31/2013    Arron Tetrault PT, DPT 12/04/2014, 12:08 PM  [facility] [address] [place], [place], [address] Phone: [phone]   Fax:   [phone]

## 2014-12-06 ENCOUNTER — Ambulatory Visit: Payer: BLUE CROSS/BLUE SHIELD | Admitting: Physical Therapy

## 2014-12-06 ENCOUNTER — Ambulatory Visit: Payer: BLUE CROSS/BLUE SHIELD

## 2014-12-06 DIAGNOSIS — I69359 Hemiplegia and hemiparesis following cerebral infarction affecting unspecified side: Secondary | ICD-10-CM

## 2014-12-06 DIAGNOSIS — R482 Apraxia: Secondary | ICD-10-CM

## 2014-12-06 DIAGNOSIS — M6281 Muscle weakness (generalized): Secondary | ICD-10-CM

## 2014-12-06 DIAGNOSIS — R2689 Other abnormalities of gait and mobility: Secondary | ICD-10-CM

## 2014-12-06 DIAGNOSIS — R6889 Other general symptoms and signs: Secondary | ICD-10-CM

## 2014-12-06 DIAGNOSIS — R4701 Aphasia: Secondary | ICD-10-CM

## 2014-12-06 DIAGNOSIS — IMO0002 Reserved for concepts with insufficient information to code with codable children: Secondary | ICD-10-CM

## 2014-12-06 DIAGNOSIS — I69898 Other sequelae of other cerebrovascular disease: Secondary | ICD-10-CM | POA: Diagnosis not present

## 2014-12-06 DIAGNOSIS — R269 Unspecified abnormalities of gait and mobility: Secondary | ICD-10-CM

## 2014-12-06 NOTE — Therapy (Signed)
[facility] [address] [place], [place], [address] Phone: [phone]   Fax:  [phone]  Physical Therapy Treatment  Patient Details  Name: Abigail Johns MRN: [id number] Date of Birth: [birth year] Referring Provider:  Prince Solian, MD  Encounter Date: 12/06/2014      PT End of Session - 12/06/14 1215    Visit Number 15   Number of Visits 25   Date for PT Re-Evaluation 12/14/14   Authorization Type BCBS   PT Start Time 1019   PT Stop Time 1101   PT Time Calculation (min) 42 min   Equipment Utilized During Treatment Gait belt   Activity Tolerance Patient limited by fatigue;Patient limited by lethargy   Behavior During Therapy Flat affect      Past Medical History  Diagnosis Date  . CHF (congestive heart failure)   . Hypertension   . High cholesterol   . Thyroid disease   . Diabetes mellitus   . Back pain, chronic   . Renal disorder   . Obesity   . OSA on CPAP   . Edema     Past Surgical History  Procedure Laterality Date  . Nephrolithotomy      Right  . Abdominal hysterectomy    . Eye surgery    . Bunionectomy      both feet  . Left knee surgery      Torn meniscus  . US echocardiography  12/24/2011    mild LVH,mild TR,trace MR,PI  . Laparoscopic ovarian cystectomy Right 11/01/2013    Procedure: LAPAROSCOPIC RIGHT OVARIAN CYSTECTOMY WITH COLLECTION OF WASHINGS ;  Surgeon: Ena Dawley, MD;  Location: [facility];  Service: Gynecology;  Laterality: Right;  . Laparoscopic lysis of adhesions N/A 11/01/2013    Procedure: LAPAROSCOPIC LYSIS OF ADHESIONS;  Surgeon: Ena Dawley, MD;  Location: [facility];  Service: Gynecology;  Laterality: N/A;  . Laparoscopy N/A 11/01/2013    Procedure: LAPAROSCOPY DIAGNOSTIC;  Surgeon: Ena Dawley, MD;  Location: [facility];  Service: Gynecology;  Laterality: N/A;  . Tee without cardioversion N/A 05/11/2014    Procedure: TRANSESOPHAGEAL ECHOCARDIOGRAM (TEE);  Surgeon:  Sanda Klein, MD;  Location: [facility];  Service: Cardiovascular;  Laterality: N/A;  . Loop recorder implant N/A 05/11/2014    Procedure: LOOP RECORDER IMPLANT;  Surgeon: Coralyn Mark, MD;  Location: [facility];  Service: Cardiovascular;  Laterality: N/A;    There were no vitals filed for this visit.  Visit Diagnosis:  Balance problems  Weakness due to cerebrovascular accident  Activity intolerance  Abnormality of gait  Hemiparesis affecting dominant side as late effect of cerebrovascular accident  Generalized muscle weakness  Lack of coordination due to stroke      Subjective Assessment - 12/06/14 1021    Subjective pt reports no falls or changes in medications. pt reports no pain today and states she last walked on Tuesday within he house short distances. pt's daughter states pt has been walking on sloped driveway to the garbage and back.    Patient is accompained by: Family member   Patient Stated Goals Walking better in community.   Currently in Pain? No/denies       Treatment  Gait training: -gait training outside w/ CGA and cane on uneven surfaces approximately 100 feet. Pt cues for step length and posture with gait, as well as cane management. During gait training pt practiced ascending and descending 2 curbs with proper technique ("up with the good, down with the bad"). Pt  also practiced ambulating over gravel surface x 2 laps.   -gait training inside on level surface with cane and CGA while patient practiced multitasking with ambulation by performing cognitive tasks (reciting her grandchildren's names and their ages)  Neuro Re-Ed: -obstacle course inside along length of counter with CGA and cane: pt practiced walking over compliant surfaces (red mat) with bean bags placed , navigating around man made "stepping stones," and then ambulating over blue mat. 4 laps.   -obstacle course on blue and red mat placed at counter with CGA and cane: pt practiced ambulating  on compliant surfaces and then walking over 6 hurdles placed in a row with adequate step length and foot clearance to prevent knocking down any hurdles. 4 laps.   - Corner balance exercises with CGA: level with feet together, level with feet together and head turns in horizontal, vertical and diagonal directions, feet together with eyes closed, feet together with eyes closed and head turns.         PT Education - 12/06/14 1214    Education provided Yes   Education Details safety with ascending and descending stairs/curbs    Person(s) Educated Patient   Methods Explanation   Comprehension Verbalized understanding          PT Short Term Goals - 10/30/14 1100    PT SHORT TERM GOAL #1   Title demonstrates understanding of updated HEP (Target Date: 11/02/14)   Time 30   Period Days   Status On-going   PT SHORT TERM GOAL #2   Title Gait Velocity >1.4 ft/sec with single point cane (Target Date: 11/02/14)   Baseline MET 10/30/14 Gait Velocity 2.01 ft/sec with cane and 1.92 ft/sec no device   Time 30   Period Days   Status Achieved   PT SHORT TERM GOAL #3   Title Berg Balance > 45/56 (Target Date: 11/02/14)   Baseline MET 10/30/14 Berg 47/56   Time 30   Period Days   Status Achieved   PT SHORT TERM GOAL #4   Title Timed Up & Go <20sec with single point cane (Target Date: 11/02/14)   Baseline MET 10/30/14 TUG 15.09sec without device & 15.15sec with cane   Time 30   Period Days   Status Achieved   PT SHORT TERM GOAL #5   Title ambulates 300' outside including some grass surfaces with single point cane and negotiates ramp /curb with supervision. (Target Date: 11/02/14)   Baseline MET 10/30/14   Time 30   Period Days   Status Achieved           PT Long Term Goals - 11/29/14 0930    PT LONG TERM GOAL #1   Title demonstrates HEP correctly. (NEW Target Date: 12/14/14)   Baseline partially met 10/02/14 Patient understands current HEP but needs to be progressed.   Time 8   Period Weeks   Status  On-going   PT LONG TERM GOAL #2   Title Gait Velocity >1.0 ft/sec target date 3/12/206   Baseline MET 10/02/14 at 1.12 ft/sec   Time 4   Period Weeks   Status Achieved   PT LONG TERM GOAL #3   Title Berg Balance >/= 50/56 (New Target Date: 12/14/14)   Baseline 10/02/14 improved but not met Merrilee Jansky 43/56 (initial was 37/56)   Time 8   Period Weeks   Status On-going   PT LONG TERM GOAL #4   Title Timed Up & Go <18 seconds (New Target Date: 12/14/14)  Baseline 10/01/14 - improved but not met TUG 22.55 sec with single point cane   Time 8   Period Weeks   Status On-going   PT LONG TERM GOAL #5   Title ambulates 300' & negotiates ramp/curb with single point cane modified independent. (New Target Date: 12/14/14)   Baseline 10/02/14 improved but not met - patient ambulates 300' with single point cane with supervision on level surfaces and min A on ramp /curb.   Time 8   Period Weeks   Status On-going   PT LONG TERM GOAL #6   Title Gait Velocity >1.8 ft/sec (New Target Date: 12/14/14)   Time 8   Period Weeks   Status On-going           Plan - 12/06/14 1217    Clinical Impression Statement pt continues to demonstrate a flat affect, and states during session that her weekend "wasn't good." However, it was motivated during therapy and performed all tasks appropriately and responded well to verbal cues. pt demonstrates progress with repetitions. At end of session today, pt reported headache (which she reported a severity level of 7/10)when completing balance tasks. pt instructed to continue balance HEP, but rest or stop exercises if headache sets on.    Pt will benefit from skilled therapeutic intervention in order to improve on the following deficits Abnormal gait;Decreased activity tolerance;Decreased balance;Decreased endurance;Decreased mobility;Decreased range of motion;Decreased strength   Rehab Potential Good   PT Frequency 2x / week   PT Duration 8 weeks   PT Treatment/Interventions  Therapeutic exercise;ADLs/Self Care Home Management;DME Instruction;Gait training;Stair training;Functional mobility training;Therapeutic activities;Balance training;Neuromuscular re-education;Patient/family education   PT Next Visit Plan Continue community gait with SPC over uneven terrain/grass & household without device, dynamic balance activities with no UE support, stair training, cognitive/multitask training with gait    Consulted and Agree with Plan of Care Patient        Problem List Patient Active Problem List   Diagnosis Date Noted  . Essential hypertension 09/18/2014  . Seizure 07/12/2014  . History of CVA with residual deficit 05/30/2014  . Seizures 05/11/2014  . CVA (cerebral infarction) 05/08/2014  . Diabetes mellitus type 2, controlled 05/08/2014  . Hypothyroidism 05/08/2014  . Hypercholesteremia 11/29/2013  . Status post laparoscopy 11/01/2013  . OSA on CPAP 10/31/2013  . HTN (hypertension) 10/31/2013  . Morbid obesity 10/31/2013  . Leg edema 10/31/2013  . Ovarian cyst 10/31/2013  . Preoperative cardiovascular examination 10/31/2013  . Pelvic mass 10/31/2013  . DM type 2 (diabetes mellitus, type 2) 10/31/2013  . History of CHF (congestive heart failure) 10/31/2013  . Acute renal insufficiency 10/31/2013  . Unspecified hypothyroidism 10/31/2013    Fanny Dance 12/06/2014, 12:22 PM  [facility] [address] [place], [place], [address] Phone: [phone]   Fax:  [phone]

## 2014-12-06 NOTE — Therapy (Signed)
[facility] [address] [place], [place], [address] Phone: [phone]   Fax:  [phone]  Speech Language Pathology Treatment  Patient Details  Name: Abigail Johns MRN: [id number] Date of Birth: 07-20-1951 Referring Provider:  Prince Solian, MD  Encounter Date: 12/06/2014      End of Session - 12/06/14 1032    Visit Number 21   Number of Visits 32   Date for SLP Re-Evaluation 01/11/15   SLP Start Time 0938   SLP Stop Time  1018   SLP Time Calculation (min) 40 min   Activity Tolerance Other (comment)  pt appeared lethargic today      Past Medical History  Diagnosis Date  . CHF (congestive heart failure)   . Hypertension   . High cholesterol   . Thyroid disease   . Diabetes mellitus   . Back pain, chronic   . Renal disorder   . Obesity   . OSA on CPAP   . Edema     Past Surgical History  Procedure Laterality Date  . Nephrolithotomy      Right  . Abdominal hysterectomy    . Eye surgery    . Bunionectomy      both feet  . Left knee surgery      Torn meniscus  . US echocardiography  12/24/2011    mild LVH,mild TR,trace MR,PI  . Laparoscopic ovarian cystectomy Right 11/01/2013    Procedure: LAPAROSCOPIC RIGHT OVARIAN CYSTECTOMY WITH COLLECTION OF WASHINGS ;  Surgeon: Ena Dawley, MD;  Location: [facility];  Service: Gynecology;  Laterality: Right;  . Laparoscopic lysis of adhesions N/A 11/01/2013    Procedure: LAPAROSCOPIC LYSIS OF ADHESIONS;  Surgeon: Ena Dawley, MD;  Location: [facility];  Service: Gynecology;  Laterality: N/A;  . Laparoscopy N/A 11/01/2013    Procedure: LAPAROSCOPY DIAGNOSTIC;  Surgeon: Ena Dawley, MD;  Location: [facility];  Service: Gynecology;  Laterality: N/A;  . Tee without cardioversion N/A 05/11/2014    Procedure: TRANSESOPHAGEAL ECHOCARDIOGRAM (TEE);  Surgeon: Sanda Klein, MD;  Location: [facility];  Service: Cardiovascular;  Laterality: N/A;  . Loop recorder  implant N/A 05/11/2014    Procedure: LOOP RECORDER IMPLANT;  Surgeon: Coralyn Mark, MD;  Location: [facility];  Service: Cardiovascular;  Laterality: N/A;    There were no vitals filed for this visit.  Visit Diagnosis: Aphasia  Apraxia      Subjective Assessment - 12/06/14 0955    Subjective Pt concerned about her aunt not wanting to bring her to therapy. "Trying to get a licen out of the room" ("Trying to get toast out of the toaster.")               ADULT SLP TREATMENT - 12/06/14 0953    General Information   Behavior/Cognition Alert;Cooperative;Pleasant mood   Treatment Provided   Treatment provided Cognitive-Linquistic   Pain Assessment   Pain Assessment No/denies pain   Cognitive-Linquistic Treatment   Treatment focused on Apraxia;Aphasia   Skilled Treatment Picture ID sentence descriptions: Pt req'd SLP questions, and semantic, phonemic, visual, and demo cues consistently for clarification. Perseveration noted. Sentence completion tasks (clozed sentences) with 80% success and rare phonemic cues.   Assessment / Recommendations / Plan   Plan Continue with current plan of care   Progression Toward Goals   Progression toward goals Not progressing toward goals (comment)            SLP Short Term Goals - 12/06/14 1038    SLP  SHORT TERM GOAL #1   Title pt will complete simple naming with 85% success over three sessions (began 08-09-14)   Status Achieved   SLP SHORT TERM GOAL #2   Title pt will complete functional communication with simple wants/needs via multimodal communication with 80% success and rare min A (began 08-09-14)   Status Deferred  Pt no longer needs   SLP SHORT TERM GOAL #3   Title demo understanding of simple conversation 90% of the time (began 08-09-14)   Status Achieved   SLP SHORT TERM GOAL #4   Title demo understanding of 4-5 sentence spoken information by answering questions with 90% success and occasional min A (began 08-09-14)   Status  Achieved   SLP SHORT TERM GOAL #5   Title pt will complete HEP with usual min A (began 11-13-14)   Time 2   Period Weeks   Status Revised   SLP SHORT TERM GOAL #6   Title pt will demo understanding of mod complex conversation 90% success with occasional repeats (began 11-13-14)   Time 2   Period Weeks   Status On-going   SLP SHORT TERM GOAL #7   Title pt will participate in mod complex conversation with 80% success (i.e., functional) with occasional min A (began 11-13-14)   Time 2   Period Weeks   Status On-going          SLP Long Term Goals - 12/06/14 1038    SLP LONG TERM GOAL #1   Title pt will demo understanding of mod complex conversation with occasional min A (began 08-09-14)   Period --  or 16 visits)   Status Achieved   SLP LONG TERM GOAL #2   Title pt will use multimodal communication PRN with rare min cues (began 08-09-14)   Period --  or 16 visits   Status Achieved   SLP LONG TERM GOAL #3   Title pt will complete any HEP with occasional min A (began 11-13-14)   Time 5   Period Weeks  or 14 visits   Status Revised  HEP provided 10-18-14   SLP LONG TERM GOAL #4   Title pt will demonstrate mod complex conversation 80% success (i.e., functional conversation) with occasional min A (began 11-13-14)   Time 5   Period Weeks   Status On-going   SLP LONG TERM GOAL #5   Title pt will demo awareness of expressive errors >80% of the time (began 11-13-14)   Time 5   Period Weeks   Status On-going   SLP LONG TERM GOAL #6   Title demo understanding of mod complex conversation with 90% success and rare repeats (began 11-13-14)   Time 5   Period Weeks   Status On-going          Plan - 12/06/14 1033    Clinical Impression Statement Pt's performance in the last two weeks has decreased when compared to a month ago. Highly suspect daughter's and mother's health is primarily to blame. Spoke with pt and daughter today about d/c next week and agreed this was appropriate at this  time.   Speech Therapy Frequency 2x / week   Duration 2 weeks   Treatment/Interventions Language facilitation;Cueing hierarchy;Multimodal communcation approach;Functional tasks;Patient/family education;Compensatory strategies;Internal/external aids;SLP instruction and feedback   Potential to Achieve Goals Fair   Potential Considerations Severity of impairments;Other (comment)        Problem List Patient Active Problem List   Diagnosis Date Noted  . Essential hypertension 09/18/2014  .  Seizure 07/12/2014  . History of CVA with residual deficit 05/30/2014  . Seizures 05/11/2014  . CVA (cerebral infarction) 05/08/2014  . Diabetes mellitus type 2, controlled 05/08/2014  . Hypothyroidism 05/08/2014  . Hypercholesteremia 11/29/2013  . Status post laparoscopy 11/01/2013  . OSA on CPAP 10/31/2013  . HTN (hypertension) 10/31/2013  . Morbid obesity 10/31/2013  . Leg edema 10/31/2013  . Ovarian cyst 10/31/2013  . Preoperative cardiovascular examination 10/31/2013  . Pelvic mass 10/31/2013  . DM type 2 (diabetes mellitus, type 2) 10/31/2013  . History of CHF (congestive heart failure) 10/31/2013  . Acute renal insufficiency 10/31/2013  . Unspecified hypothyroidism 10/31/2013    Reighan Hipolito,SLP 12/06/2014, 10:39 AM  [facility] [address] [place], [place], [address] Phone: [phone]   Fax:  [phone]

## 2014-12-07 ENCOUNTER — Ambulatory Visit (INDEPENDENT_AMBULATORY_CARE_PROVIDER_SITE_OTHER): Payer: BLUE CROSS/BLUE SHIELD | Admitting: *Deleted

## 2014-12-07 DIAGNOSIS — I63412 Cerebral infarction due to embolism of left middle cerebral artery: Secondary | ICD-10-CM | POA: Diagnosis not present

## 2014-12-11 ENCOUNTER — Ambulatory Visit: Payer: BLUE CROSS/BLUE SHIELD

## 2014-12-11 ENCOUNTER — Ambulatory Visit: Payer: BLUE CROSS/BLUE SHIELD | Admitting: Physical Therapy

## 2014-12-11 DIAGNOSIS — I69898 Other sequelae of other cerebrovascular disease: Secondary | ICD-10-CM | POA: Diagnosis not present

## 2014-12-11 DIAGNOSIS — R4701 Aphasia: Secondary | ICD-10-CM

## 2014-12-11 DIAGNOSIS — R482 Apraxia: Secondary | ICD-10-CM

## 2014-12-11 NOTE — Therapy (Signed)
[facility] [address] [place], [place], [address] Phone: [phone]   Fax:  [phone]  Speech Language Pathology Treatment  Patient Details  Name: Abigail Johns MRN: [id number] Date of Birth: [birth year] Referring Provider:  Prince Solian, MD  Encounter Date: 12/11/2014      End of Session - 12/11/14 1135    Visit Number 22   Number of Visits 39   Date for SLP Re-Evaluation 01/11/15      Past Medical History  Diagnosis Date  . CHF (congestive heart failure)   . Hypertension   . High cholesterol   . Thyroid disease   . Diabetes mellitus   . Back pain, chronic   . Renal disorder   . Obesity   . OSA on CPAP   . Edema     Past Surgical History  Procedure Laterality Date  . Nephrolithotomy      Right  . Abdominal hysterectomy    . Eye surgery    . Bunionectomy      both feet  . Left knee surgery      Torn meniscus  . US echocardiography  12/24/2011    mild LVH,mild TR,trace MR,PI  . Laparoscopic ovarian cystectomy Right 11/01/2013    Procedure: LAPAROSCOPIC RIGHT OVARIAN CYSTECTOMY WITH COLLECTION OF WASHINGS ;  Surgeon: Ena Dawley, MD;  Location: [facility];  Service: Gynecology;  Laterality: Right;  . Laparoscopic lysis of adhesions N/A 11/01/2013    Procedure: LAPAROSCOPIC LYSIS OF ADHESIONS;  Surgeon: Ena Dawley, MD;  Location: [facility];  Service: Gynecology;  Laterality: N/A;  . Laparoscopy N/A 11/01/2013    Procedure: LAPAROSCOPY DIAGNOSTIC;  Surgeon: Ena Dawley, MD;  Location: [facility];  Service: Gynecology;  Laterality: N/A;  . Tee without cardioversion N/A 05/11/2014    Procedure: TRANSESOPHAGEAL ECHOCARDIOGRAM (TEE);  Surgeon: Sanda Klein, MD;  Location: [facility];  Service: Cardiovascular;  Laterality: N/A;  . Loop recorder implant N/A 05/11/2014    Procedure: LOOP RECORDER IMPLANT;  Surgeon: Coralyn Mark, MD;  Location: [facility];  Service: Cardiovascular;  Laterality:  N/A;    There were no vitals filed for this visit.  Visit Diagnosis: Apraxia  Aphasia      Subjective Assessment - 12/11/14 1110    Subjective Pt had to cancel last visit Thursday. Pt would like today to be last visit.    Patient is accompained by: Family member  daughter [name]               ADULT SLP TREATMENT - 12/11/14 1113    General Information   Behavior/Cognition Alert;Cooperative;Pleasant mood   Treatment Provided   Treatment provided Cognitive-Linquistic   Pain Assessment   Pain Assessment No/denies pain   Cognitive-Linquistic Treatment   Treatment focused on Apraxia;Aphasia   Skilled Treatment Answering simple questions required SLP semantic and phonemic, and visual cues. Picture description task req'd verbal, visual cues usually. Pt's conversation re: simple topics req'd semantic and phonemic cues with decr'd error awareness.     Assessment / Recommendations / Plan   Plan Discharge SLP treatment due to (comment)  pt request   Progression Toward Goals   Progression toward goals Not progressing toward goals (comment)  pt to obtain re-eval when things at home subside            SLP Short Term Goals - 12/11/14 1156    SLP SHORT TERM GOAL #1   Title pt will complete simple naming with 85% success over three sessions (  began 08-09-14)   Status Achieved   SLP SHORT TERM GOAL #2   Title pt will complete functional communication with simple wants/needs via multimodal communication with 80% success and rare min A (began 08-09-14)   Status Deferred  Pt no longer needs   SLP SHORT TERM GOAL #3   Title demo understanding of simple conversation 90% of the time (began 08-09-14)   Status Achieved   SLP SHORT TERM GOAL #4   Title demo understanding of 4-5 sentence spoken information by answering questions with 90% success and occasional min A (began 08-09-14)   Status Achieved   SLP SHORT TERM GOAL #5   Title pt will complete HEP with usual min A (began 11-13-14)    Time 2   Period Weeks   Status Not Met   SLP SHORT TERM GOAL #6   Title pt will demo understanding of mod complex conversation 90% success with occasional repeats (began 11-13-14)   Time 2   Period Weeks   Status Not Met   SLP SHORT TERM GOAL #7   Title pt will participate in mod complex conversation with 80% success (i.e., functional) with occasional min A (began 11-13-14)   Time 2   Period Weeks   Status Not Met          SLP Long Term Goals - 12/11/14 1157    SLP LONG TERM GOAL #1   Title pt will demo understanding of mod complex conversation with occasional min A (began 08-09-14)   Period --  or 16 visits)   Status Achieved   SLP LONG TERM GOAL #2   Title pt will use multimodal communication PRN with rare min cues (began 08-09-14)   Period --  or 16 visits   Status Achieved   SLP LONG TERM GOAL #3   Title pt will complete any HEP with occasional min A (began 11-13-14)   Time 5   Period Weeks  or 14 visits   Status Not Met  HEP provided 10-18-14   SLP LONG TERM GOAL #4   Title pt will demonstrate mod complex conversation 80% success (i.e., functional conversation) with occasional min A (began 11-13-14)   Time 5   Period Weeks   Status Not Met   SLP LONG TERM GOAL #5   Title pt will demo awareness of expressive errors >80% of the time (began 11-13-14)   Time 5   Period Weeks   Status Not Met   SLP LONG TERM GOAL #6   Title demo understanding of mod complex conversation with 90% success and rare repeats (began 11-13-14)   Time 5   Period Weeks   Status Not Met          Plan - 12/11/14 1136    Clinical Impression Statement Pt cont with receptive language deficits, expressive language and verbal expression deficits, with decr'd error awareness. Skills declined in last 4 weeks presumably due to incr'd copmlications with home environment. Discharge today due to pt request.    Treatment/Interventions Language facilitation;Cueing hierarchy;Multimodal communcation  approach;Functional tasks;Patient/family education;Compensatory strategies;Internal/external aids;SLP instruction and feedback   Potential to Achieve Goals Fair   Potential Considerations Severity of impairments;Other (comment)   Consulted and Agree with Plan of Care Patient;Family member/caregiver   Family Member Consulted daughter      SPEECH THERAPY DISCHARGE SUMMARY  Visits from Start of Care: 22  Current functional level related to goals / functional outcomes: Pt met 3/6 STGs and 2/6 LTGs within the scheduled time frames  of this therapy course. At the time of therapy renewal the patient was working towards both receptive comprehension and expressive communication of mod complex conversation. Shortly after renewal, pt's daughter had an ortho injury at work requiring short term disability and pt's mother's health declined. Pt had approx 2-3 weeks of declining speech and language skills, without any other overt s/s of CVA.  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   Education / Equipment: Compensations for apraxia, aphasia  Plan: Patient agrees to discharge.  Patient goals were partially met. Patient is being discharged due to the patient's request.  ?????         Problem List Patient Active Problem List   Diagnosis Date Noted  . Essential hypertension 09/18/2014  . Seizure 07/12/2014  . History of CVA with residual deficit 05/30/2014  . Seizures 05/11/2014  . CVA (cerebral infarction) 05/08/2014  . Diabetes mellitus type 2, controlled 05/08/2014  . Hypothyroidism 05/08/2014  . Hypercholesteremia 11/29/2013  . Status post laparoscopy 11/01/2013  . OSA on CPAP 10/31/2013  . HTN (hypertension) 10/31/2013  . Morbid obesity 10/31/2013   . Leg edema 10/31/2013  . Ovarian cyst 10/31/2013  . Preoperative cardiovascular examination 10/31/2013  . Pelvic mass 10/31/2013  . DM type 2 (diabetes mellitus, type 2) 10/31/2013  . History of CHF (congestive heart failure) 10/31/2013  . Acute renal insufficiency 10/31/2013  . Unspecified hypothyroidism 10/31/2013    [facility] , MS, CCC-SLP  12/11/2014, 11:58 AM  [facility] [address] [place], [place], [address] Phone: [phone]   Fax:  [phone]

## 2014-12-11 NOTE — Patient Instructions (Signed)
Complete speech tasks for about 30 minutes, 3 times a week

## 2014-12-12 ENCOUNTER — Encounter: Payer: Self-pay | Admitting: Physical Therapy

## 2014-12-12 ENCOUNTER — Ambulatory Visit: Payer: BLUE CROSS/BLUE SHIELD | Admitting: Physical Therapy

## 2014-12-12 DIAGNOSIS — R2689 Other abnormalities of gait and mobility: Secondary | ICD-10-CM

## 2014-12-12 DIAGNOSIS — R6889 Other general symptoms and signs: Secondary | ICD-10-CM

## 2014-12-12 DIAGNOSIS — I69898 Other sequelae of other cerebrovascular disease: Secondary | ICD-10-CM | POA: Diagnosis not present

## 2014-12-12 DIAGNOSIS — R269 Unspecified abnormalities of gait and mobility: Secondary | ICD-10-CM

## 2014-12-12 DIAGNOSIS — IMO0002 Reserved for concepts with insufficient information to code with codable children: Secondary | ICD-10-CM

## 2014-12-12 NOTE — Therapy (Signed)
[facility] [address] [place], [place], [address] Phone: [phone]   Fax:  [phone]  Patient Details  Name: Abigail Johns MRN: [id number] Date of Birth: Nov 06, 1950 Referring Provider:  Dr. Rosalin Hawking  Encounter Date: 12/12/2014  PHYSICAL THERAPY DISCHARGE SUMMARY  Visits from Start of Care: 16  Current functional level related to goals / functional outcomes:     PT Long Term Goals - 12/12/14 1445    PT LONG TERM GOAL #1   Title demonstrates HEP correctly. (NEW [employer] Date: 12/14/14)   Baseline MET 12/12/2014   Time 8   Period Weeks   Status Achieved   PT LONG TERM GOAL #2   Title Gait Velocity >1.0 ft/sec target date 3/12/206   Baseline MET 10/02/14 at 1.12 ft/sec   Time 4   Period Weeks   Status Achieved   PT LONG TERM GOAL #3   Title Berg Balance >/= 50/56 (New [employer] Date: 12/14/14)   Baseline MET 12/12/2014 Berg Balance 52/56   Time 8   Period Weeks   Status Achieved   PT LONG TERM GOAL #4   Title Timed Up & Go <18 seconds (New [employer] Date: 12/14/14)   Baseline MET 12/12/2014 13.13 seconds with cane & 12.28 seconds without device   Time 8   Period Weeks   Status Achieved   PT LONG TERM GOAL #5   Title ambulates 300' & negotiates ramp/curb with single point cane modified independent. (New [employer] Date: 12/14/14)   Baseline MET 12/12/2014 ambulates 400' & negotiated ramp/ curb with tripod tip single point cane modified independent.   Time 8   Period Weeks   Status Achieved   PT LONG TERM GOAL #6   Title Gait Velocity >1.8 ft/sec (New [employer] Date: 12/14/14)   Baseline MET 12/12/2014 2.12 ft/sec   Time 8   Period Weeks   Status Achieved       Remaining deficits: Patient appears safe to ambulate inside her home without the device and in community with single point cane independently. Patient lacks confidence to perform activities without her daughter but verbalizes understanding of capability.  Patient's daughter had an injury to her knee which has made this patient limit her activities.   Education / Equipment: CVA education, HEP / ongoing fitness plan  Plan: Patient agrees to discharge.  Patient goals were met. Patient is being discharged due to meeting the stated rehab goals.  ?????       Arthur Speagle PT, DPT 12/12/2014, 4:00 PM  [facility] [address] [place], [place], [address] Phone: [phone]   Fax:  [phone]

## 2014-12-12 NOTE — Therapy (Signed)
[facility] [address] [place], [place], [address] Phone: [phone]   Fax:  [phone]  Physical Therapy Treatment  Patient Details  Name: Abigail Johns MRN: [id number] Date of Birth: [birth year] Referring Provider:  Prince Solian, MD  Encounter Date: 12/12/2014      PT End of Session - 12/12/14 1445    Visit Number 16   Number of Visits 25   Date for PT Re-Evaluation 12/14/14   Authorization Type BC[employer]   PT Start Time 1445   PT Stop Time 1533   PT Time Calculation (min) 48 min   Equipment Utilized During Treatment Gait belt   Activity Tolerance Patient tolerated treatment well   Behavior During Therapy [facility] for tasks assessed/performed      Past Medical History  Diagnosis Date  . CHF (congestive heart failure)   . Hypertension   . High cholesterol   . Thyroid disease   . Diabetes mellitus   . Back pain, chronic   . Renal disorder   . Obesity   . OSA on CPAP   . Edema     Past Surgical History  Procedure Laterality Date  . Nephrolithotomy      Right  . Abdominal hysterectomy    . Eye surgery    . Bunionectomy      both feet  . Left knee surgery      Torn meniscus  . US echocardiography  12/24/2011    mild LVH,mild TR,trace MR,PI  . Laparoscopic ovarian cystectomy Right 11/01/2013    Procedure: LAPAROSCOPIC RIGHT OVARIAN CYSTECTOMY WITH COLLECTION OF WASHINGS ;  Surgeon: Ena Dawley, MD;  Location: [facility];  Service: Gynecology;  Laterality: Right;  . Laparoscopic lysis of adhesions N/A 11/01/2013    Procedure: LAPAROSCOPIC LYSIS OF ADHESIONS;  Surgeon: Ena Dawley, MD;  Location: [facility];  Service: Gynecology;  Laterality: N/A;  . Laparoscopy N/A 11/01/2013    Procedure: LAPAROSCOPY DIAGNOSTIC;  Surgeon: Ena Dawley, MD;  Location: [facility];  Service: Gynecology;  Laterality: N/A;  . Tee without cardioversion N/A 05/11/2014    Procedure: TRANSESOPHAGEAL ECHOCARDIOGRAM (TEE);  Surgeon:  Sanda Klein, MD;  Location: [facility];  Service: Cardiovascular;  Laterality: N/A;  . Loop recorder implant N/A 05/11/2014    Procedure: LOOP RECORDER IMPLANT;  Surgeon: Coralyn Mark, MD;  Location: [facility];  Service: Cardiovascular;  Laterality: N/A;    There were no vitals filed for this visit.  Visit Diagnosis:  Balance problems  Weakness due to cerebrovascular accident  Activity intolerance  Abnormality of gait      Subjective Assessment - 12/12/14 1455    Subjective No falls or issues.   Currently in Pain? No/denies            [facility] PT Assessment - 12/12/14 1445    Ambulation/Gait   Ambulation/Gait Yes   Ambulation/Gait Assistance 6: Modified independent (Device/Increase time)   Ambulation Distance (Feet) 500 Feet   Assistive device Straight cane   Gait velocity 2.12 ft/sec   Stairs Yes   Stairs Assistance 6: Modified independent (Device/Increase time)   Stair Management Technique One rail Right   Number of Stairs 4   Ramp 6: Modified independent (Device)  single point cane   Curb 6: Modified independent (Device/increase time)  single point cane   [employer]   Sit to Stand Able to stand without using hands and stabilize independently   Standing Unsupported Able to stand safely 2 minutes   Sitting with  Back Unsupported but Feet Supported on Floor or Stool Able to sit safely and securely 2 minutes   Stand to Sit Sits safely with minimal use of hands   Transfers Able to transfer safely, minor use of hands   Standing Unsupported with Eyes Closed Able to stand 10 seconds safely   Standing Ubsupported with Feet Together Able to place feet together independently and stand 1 minute safely   From Standing, Reach Forward with Outstretched Arm Can reach confidently >25 cm (10")   From Standing Position, Pick up Object from Floor Able to pick up shoe safely and easily   From Standing Position, Turn to Look Behind Over each Shoulder Looks behind from both  sides and weight shifts well   Turn 360 Degrees Able to turn 360 degrees safely in 4 seconds or less   Standing Unsupported, Alternately Place Feet on Step/Stool Able to stand independently and complete 8 steps >20 seconds   Standing Unsupported, One Foot in Front Able to plae foot ahead of the other independently and hold 30 seconds   Standing on One Leg Able to lift leg independently and hold equal to or more than 3 seconds   Total Score 52   Timed Up and Go Test   Normal TUG (seconds) 12.28  13.13 seconds with cane & 12.28 seconds without device                             PT Education - 12/12/14 1445    Education provided Yes   Education Details ongoing HEP, activity level and returning to things she did prior to CVA   Person(s) Educated Patient;Child(ren)   Methods Explanation   Comprehension Verbalized understanding          PT Short Term Goals - 10/30/14 1100    PT SHORT TERM GOAL #1   Title demonstrates understanding of updated HEP (Target Date: 11/02/14)   Time 30   Period Days   Status On-going   PT SHORT TERM GOAL #2   Title Gait Velocity >1.4 ft/sec with single point cane (Target Date: 11/02/14)   Baseline MET 10/30/14 Gait Velocity 2.01 ft/sec with cane and 1.92 ft/sec no device   Time 30   Period Days   Status Achieved   PT SHORT TERM GOAL #3   Title Berg Balance > 45/56 (Target Date: 11/02/14)   Baseline MET 10/30/14 Berg 47/56   Time 30   Period Days   Status Achieved   PT SHORT TERM GOAL #4   Title Timed Up & Go <20sec with single point cane (Target Date: 11/02/14)   Baseline MET 10/30/14 TUG 15.09sec without device & 15.15sec with cane   Time 30   Period Days   Status Achieved   PT SHORT TERM GOAL #5   Title ambulates 300' outside including some grass surfaces with single point cane and negotiates ramp /curb with supervision. (Target Date: 11/02/14)   Baseline MET 10/30/14   Time 30   Period Days   Status Achieved           PT Long Term  Goals - 12/12/14 1445    PT LONG TERM GOAL #1   Title demonstrates HEP correctly. (NEW Target Date: 12/14/14)   Baseline MET 12/12/2014   Time 8   Period Weeks   Status Achieved   PT LONG TERM GOAL #2   Title Gait Velocity >1.0 ft/sec target date 3/12/206   Baseline MET  10/02/14 at 1.12 ft/sec   Time 4   Period Weeks   Status Achieved   PT LONG TERM GOAL #3   Title Berg Balance >/= 50/56 (New Target Date: 12/14/14)   Baseline MET 12/12/2014 Berg Balance 52/56   Time 8   Period Weeks   Status Achieved   PT LONG TERM GOAL #4   Title Timed Up & Go <18 seconds (New Target Date: 12/14/14)   Baseline MET 12/12/2014 13.13 seconds with cane & 12.28 seconds without device   Time 8   Period Weeks   Status Achieved   PT LONG TERM GOAL #5   Title ambulates 300' & negotiates ramp/curb with single point cane modified independent. (New Target Date: 12/14/14)   Baseline MET 12/12/2014 ambulates 400' & negotiated ramp/ curb with tripod tip single point cane modified independent.   Time 8   Period Weeks   Status Achieved   PT LONG TERM GOAL #6   Title Gait Velocity >1.8 ft/sec (New Target Date: 12/14/14)   Baseline MET 12/12/2014 2.12 ft/sec   Time 8   Period Weeks   Status Achieved               Plan - 12/12/14 1445    Clinical Impression Statement patient met all LTGs. Patient appears safe to ambulate short, household distances without device and community level distances with cane. Her Berg, TUG and gait velocity indicate low fall risk now with statiscally significant gains.   Pt will benefit from skilled therapeutic intervention in order to improve on the following deficits Abnormal gait;Decreased activity tolerance;Decreased balance;Decreased endurance;Decreased mobility;Decreased range of motion;Decreased strength   Rehab Potential Good   PT Frequency 2x / week   PT Duration 8 weeks   PT Treatment/Interventions Therapeutic exercise;ADLs/Self Care Home Management;DME Instruction;Gait  training;Stair training;Functional mobility training;Therapeutic activities;Balance training;Neuromuscular re-education;Patient/family education   PT Next Visit Plan discharge   Consulted and Agree with Plan of Care Patient;Family member/caregiver   Family Member Consulted daughter        Problem List Patient Active Problem List   Diagnosis Date Noted  . Essential hypertension 09/18/2014  . Seizure 07/12/2014  . History of CVA with residual deficit 05/30/2014  . Seizures 05/11/2014  . CVA (cerebral infarction) 05/08/2014  . Diabetes mellitus type 2, controlled 05/08/2014  . Hypothyroidism 05/08/2014  . Hypercholesteremia 11/29/2013  . Status post laparoscopy 11/01/2013  . OSA on CPAP 10/31/2013  . HTN (hypertension) 10/31/2013  . Morbid obesity 10/31/2013  . Leg edema 10/31/2013  . Ovarian cyst 10/31/2013  . Preoperative cardiovascular examination 10/31/2013  . Pelvic mass 10/31/2013  . DM type 2 (diabetes mellitus, type 2) 10/31/2013  . History of CHF (congestive heart failure) 10/31/2013  . Acute renal insufficiency 10/31/2013  . Unspecified hypothyroidism 10/31/2013    Laurabeth Yip PT, DPT 12/12/2014, 3:55 PM  [facility] [address] [place], [place], [address] Phone: [phone]   Fax:  [phone]

## 2014-12-13 ENCOUNTER — Ambulatory Visit: Payer: BLUE CROSS/BLUE SHIELD | Admitting: Physical Therapy

## 2014-12-14 NOTE — Progress Notes (Signed)
Loop recorder 

## 2014-12-26 LAB — CUP PACEART REMOTE DEVICE CHECK: Date Time Interrogation Session: 20160601184047

## 2014-12-27 ENCOUNTER — Encounter: Payer: Self-pay | Admitting: Neurology

## 2014-12-27 ENCOUNTER — Ambulatory Visit (INDEPENDENT_AMBULATORY_CARE_PROVIDER_SITE_OTHER): Payer: BLUE CROSS/BLUE SHIELD | Admitting: Neurology

## 2014-12-27 VITALS — BP 113/75 | HR 73 | Wt 197.0 lb

## 2014-12-27 DIAGNOSIS — R569 Unspecified convulsions: Secondary | ICD-10-CM

## 2014-12-27 DIAGNOSIS — I63412 Cerebral infarction due to embolism of left middle cerebral artery: Secondary | ICD-10-CM | POA: Diagnosis not present

## 2014-12-27 DIAGNOSIS — Z8679 Personal history of other diseases of the circulatory system: Secondary | ICD-10-CM

## 2014-12-27 DIAGNOSIS — I10 Essential (primary) hypertension: Secondary | ICD-10-CM

## 2014-12-27 DIAGNOSIS — E7800 Pure hypercholesterolemia, unspecified: Secondary | ICD-10-CM

## 2014-12-27 DIAGNOSIS — E119 Type 2 diabetes mellitus without complications: Secondary | ICD-10-CM

## 2014-12-27 NOTE — Progress Notes (Signed)
STROKE NEUROLOGY FOLLOW UP NOTE  NAME: Yajaira Bansal DOB: 21-May-1951  REASON FOR VISIT: stroke follow up HISTORY FROM: chart and and pt and daughter  Today we had the pleasure of seeing Gwyndolyn Guilford in follow-up at our Neurology Clinic. Pt was accompanied by daughter.   History Summary Trichelle Lehan is a 64 y.o. female PMH of HTN, DM, CHF, HLD intolerate to statin, thyroid disease, right glass eye, OSA, and recent stroke in 9/15 at [facility] in [place], [place] that left her with residual right side hemiparesis was admitted on 05/08/14 for unresponsiveness. There was no mentioning of any urinary or stool incontinence or shaking. EMS was then called by the pt's daughter where she continued to be non responsive, with right sided hemiparesis, and some rapid eye movement. No frank seizure like activity seen. Concerning for seizure episodes, EEG showed no seizure but left frontotemporal cerebral dysfunction consistent with previous stroke. She was put on keppra. MRI showed left temporal subacute stroke but also acute left coronal radiata and BG subcortical stroke. MRA showed left M1 severe stenosis. Her stroke considered emboli pattern but coronal radiata and BG stroke could also be due to hypotension in the setting of M1 high grade stenosis. TEE was done no cardiac source of emboli. Loop recorder was placed and pt sent to inpt rehab with full dose ASA as well as zetia.  07/12/14 follow up - the patient has been doing better. However, still weak on the left with sensory loss, with partial aphasia. Her BP in good control and today 127/73 in clinic. Pt stated that her glucose at home also good although did not give me numbers. Loop recording so far no afib episodes. Her therapies were stopped for some time due to insurance switch. Now she has coverage, will need to restart the therapy.  09/18/14 follow up - the patient was doing slightly better. She started to have PT OT  rehabilitation last week, she is going to do twice week. SBP around 120 at home as per daughter. However this morning is 101/65 in clinic. As per daughter her diabetes controlled pretty good, currently off insulin. On glipizide and metformin. However, patient stated that she has alopecia after taking Keppra. She has no seizure episode reported. Loop recorder did not show atrial fibrillation episodes so far.  Interval History During the interval time, she has been doing well. Completed PT/OT/speech sessions and made progress on right hemiparesis, however, the partial aphsia still remains. She still has difficulty with writing and not able to fully understand conversations. She is going to have speech therapy again in 2 months. So far loop recorder showed no afib episodes. BP at home 120s, today 113/75. She stated that her glucose is under control, today 103.  REVIEW OF SYSTEMS: Full 14 system review of systems performed and notable only for those listed below and in HPI above, all others are negative:  Constitutional: N/A  Cardiovascular: N/A  Ear/Nose/Throat: N/A  Skin: N/A  Eyes: N/A  Respiratory: SOB  Gastroitestinal: diarrhea  Genitourinary: N/A Hematology/Lymphatic: N/A  Endocrine: N/A  Musculoskeletal: N/A  Allergy/Immunology: N/A  Neurological: N/A  Psychiatric: N/A  The following represents the patient's updated allergies and side effects list: Allergies  Allergen Reactions  . Lipitor [Atorvastatin Calcium] Other (See Comments)    Myocitis  . Crestor [Rosuvastatin]     myalgias    The neurologically relevant items on the patient's problem list were reviewed on today's visit.  Neurologic Examination  A  problem focused neurological exam (12 or more points of the single system neurologic examination, vital signs counts as 1 point, cranial nerves count for 8 points) was performed.  Blood pressure 113/75, pulse 73, weight 197 lb (89.359 kg).  General - Well nourished, well  developed, in no apparent distress.  Ophthalmologic - right eye glass, left eye not able to see through.  Cardiovascular - Regular rate and rhythm with no murmur.  Mental Status -  Level of arousal and orientation to time, place, and person were intact. Partial expressive and receptive aphasia, able to name objects 2/3, able to repeat but slow, consistent with transcortical motor/sensory aphasia.   Cranial Nerves II - XII - II - Visual field intact OU. III, IV, VI - Extraocular movements intact. V - Facial sensation symmetrical bilaterally. VII - mild right facial droop. VIII - Hearing & vestibular intact bilaterally. X - Palate elevates symmetrically, mild dysarthria. XI - Chin turning & shoulder shrug intact bilaterally. XII - Tongue protrusion intact.  Motor Strength - The patient's strength 5-/5 RUE proximal with pronator drift, 5-/5 RUE distally with dexterity difficulty, RLE 5-/5. LUE and LLE 5/5.  Bulk was normal and fasciculations were absent.   Motor Tone - Muscle tone was assessed at the neck and appendages and was normal.  Reflexes - The patient's reflexes were 1+ in all extremities and she had no pathological reflexes.  Sensory - Light touch, temperature/pinprick were symmetrical bilaterally.    Coordination - The patient had normal movements in the hands and feet with no ataxia or dysmetria.  Tremor was absent.  Gait and Station - right mild hemiparetic gait, slow with small stride, but able to walk without the cane.  Data reviewed: I personally reviewed the images and agree with the radiology interpretations.  Ct Abdomen Pelvis Wo Contrast 05/09/2014 1. No acute intra-abdominal findings. 2. Nonobstructive bilateral nephrolithiasis. 3. Hepatic steatosis.   Dg Chest 2 View 05/09/2014 Low lung volumes with bibasilar atelectasis.   Ct Head Wo Contrast 05/09/2014 1. Grossly unchanged ill-defined left-sided periventricular hypodensities compatible with the  known acute infarct demonstrated on brain MRI performed day prior. No CT evidence of hemorrhagic conversion or new acute large territory infarct. 2. Increased attenuation of multiple sulci within the left temporal lobe compatible with mineralization as demonstrated on prior brain MRI.  05/08/2014 There is hemorrhage in the parenchyma of the lateral left temporal lobe. This appearance raises question of amyloid angiography. There is a questionable acute small infarct in the anterior limb of the left internal capsule. There is atrophy with patchy periventricular small vessel disease. There is no subdural or epidural fluid. 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  It should be noted that the patient by report had a CVA approximately 1 month ago. It is possible that the apparent hemorrhage in the left temporal region actually represents calcification as a reactive response to prior infarct. It is possible that there is both calcification and hemorrhage in this area. Given this circumstance, MR could be most helpful for further assessment with respect to these differential considerations.  Mr Brain Wo Contrast 05/08/2014 Moderately extensive acute LEFT subcortical white matter infarction. Abnormal LEFT temporal lobe on CT consistent with mineralization of a subacute insult. No parenchymal hemorrhage is evident.   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   2D Echocardiogram No cardiac source of emboli was indentified. EF 60-65%   Carotid Doppler No evidence of  hemodynamically significant internal carotid artery stenosis. Vertebral artery flow is antegrade.   EEG - Clinical Interpretation: This EEG demonstrates evidence of a left frontotemporal cerebral dysfunction that is etiologically nonspecific. There was no seizure or seizure predisposition recorded on this study.   TEE 05/11/2014 Normal TEE. No  cardiac source of embolism.  Loop recording - no afib so far  Component     Latest Ref Rng 05/09/2014  Cholesterol     0 - 200 mg/dL 200  Triglycerides     <150 mg/dL 198 (H)  HDL     >39 mg/dL 40  Total CHOL/HDL Ratio      5.0  VLDL     0 - 40 mg/dL 40  LDL (calc)     0 - 99 mg/dL 120 (H)  Hgb A1c MFr Bld     <5.7 % 7.3 (H)  Mean Plasma Glucose     <117 mg/dL 163 (H)  TSH     0.350 - 4.500 uIU/mL 3.160    Assessment: As you may recall, she is a 64 y.o. African American female with PMH of HTN, DM, CHF, HLD intolerate to statin, thyroid disease, right glass eye, OSA, and recent stroke in 9/15 at [facility] in [place], [place] that left her with residual right side hemiparesis was admitted on 05/08/14 for unresponsiveness. Concerning for seizure episodes, EEG showed no seizure but left frontotemporal cerebral dysfunction consistent with previous stroke. She was put on keppra. MRI showed left temporal subacute stroke but also acute left coronal radiata and BG subcortical stroke. MRA showed left M1 severe stenosis. Her stroke considered emboli pattern but coronal radiata and BG stroke could also be due to hypotension in the setting of M1 high grade stenosis. TEE was done no cardiac source of emboli. Loop recorder was placed and so far no Afib episodes. She is not a RESPECT ESUS candidate due to left M1 high-grade stenosis. Her right hemiparesis improved but still has partial aphasia.  Plan:  - continue Plavix for stroke prevention - zetia for HLD due to intolerance of statins - due to left M1 high grade stenosis, will keep BP at 120-150. Avoid hypotension. - check glucose at home - Follow up with your primary care physician for stroke risk factor modification. Recommend maintain blood pressure goal <130/80, diabetes with hemoglobin A1c goal below 6.5% and lipids with LDL cholesterol goal below 70 mg/dL.  - RTC in 6 months.   No orders of the defined types were placed  in this encounter.    No orders of the defined types were placed in this encounter.    Patient Instructions  - continue Plavix for stroke prevention - zetia for high cholesterol and triglyceride due to intolerance of statins - check BP at home, goal BP at 120-150. Avoid hypotension. - check glucose at home - Follow up with your primary care physician for stroke risk factor modification. Recommend maintain blood pressure goal <130/80, diabetes with hemoglobin A1c goal below 6.5% and lipids with LDL cholesterol goal below 70 mg/dL.  - Continue physical exercise and speech exercise at home - follow up in 6 months.    Rosalin Hawking, MD PhD [facility] [address] [place], [place] [address] [phone]   vision as you for the

## 2014-12-27 NOTE — Patient Instructions (Signed)
-   continue Plavix for stroke prevention - zetia for high cholesterol and triglyceride due to intolerance of statins - check BP at home, goal BP at 120-150. Avoid hypotension. - check glucose at home - Follow up with your primary care physician for stroke risk factor modification. Recommend maintain blood pressure goal <130/80, diabetes with hemoglobin A1c goal below 6.5% and lipids with LDL cholesterol goal below 70 mg/dL.  - Continue physical exercise and speech exercise at home - follow up in 6 months.

## 2015-01-02 ENCOUNTER — Encounter (HOSPITAL_COMMUNITY): Payer: Self-pay | Admitting: *Deleted

## 2015-01-02 ENCOUNTER — Emergency Department (HOSPITAL_COMMUNITY)
Admission: EM | Admit: 2015-01-02 | Discharge: 2015-01-02 | Disposition: A | Discharge status: Home / Self Care | Payer: 59 | Attending: Emergency Medicine | Admitting: Emergency Medicine

## 2015-01-02 ENCOUNTER — Emergency Department (HOSPITAL_COMMUNITY): Payer: 59

## 2015-01-02 DIAGNOSIS — Y92002 Bathroom of unspecified non-institutional (private) residence single-family (private) house as the place of occurrence of the external cause: Secondary | ICD-10-CM | POA: Insufficient documentation

## 2015-01-02 DIAGNOSIS — R350 Frequency of micturition: Secondary | ICD-10-CM | POA: Diagnosis not present

## 2015-01-02 DIAGNOSIS — W1830XA Fall on same level, unspecified, initial encounter: Secondary | ICD-10-CM | POA: Insufficient documentation

## 2015-01-02 DIAGNOSIS — Y999 Unspecified external cause status: Secondary | ICD-10-CM | POA: Insufficient documentation

## 2015-01-02 DIAGNOSIS — E669 Obesity, unspecified: Secondary | ICD-10-CM | POA: Diagnosis not present

## 2015-01-02 DIAGNOSIS — S0083XA Contusion of other part of head, initial encounter: Secondary | ICD-10-CM | POA: Insufficient documentation

## 2015-01-02 DIAGNOSIS — Z792 Long term (current) use of antibiotics: Secondary | ICD-10-CM | POA: Insufficient documentation

## 2015-01-02 DIAGNOSIS — E780 Pure hypercholesterolemia: Secondary | ICD-10-CM | POA: Diagnosis not present

## 2015-01-02 DIAGNOSIS — Z87891 Personal history of nicotine dependence: Secondary | ICD-10-CM | POA: Insufficient documentation

## 2015-01-02 DIAGNOSIS — G8929 Other chronic pain: Secondary | ICD-10-CM | POA: Insufficient documentation

## 2015-01-02 DIAGNOSIS — Z79899 Other long term (current) drug therapy: Secondary | ICD-10-CM | POA: Insufficient documentation

## 2015-01-02 DIAGNOSIS — R630 Anorexia: Secondary | ICD-10-CM | POA: Diagnosis not present

## 2015-01-02 DIAGNOSIS — I10 Essential (primary) hypertension: Secondary | ICD-10-CM | POA: Insufficient documentation

## 2015-01-02 DIAGNOSIS — Y939 Activity, unspecified: Secondary | ICD-10-CM | POA: Diagnosis not present

## 2015-01-02 DIAGNOSIS — Z9981 Dependence on supplemental oxygen: Secondary | ICD-10-CM | POA: Diagnosis not present

## 2015-01-02 DIAGNOSIS — Z87448 Personal history of other diseases of urinary system: Secondary | ICD-10-CM | POA: Insufficient documentation

## 2015-01-02 DIAGNOSIS — S8001XA Contusion of right knee, initial encounter: Secondary | ICD-10-CM | POA: Insufficient documentation

## 2015-01-02 DIAGNOSIS — S8002XA Contusion of left knee, initial encounter: Secondary | ICD-10-CM | POA: Diagnosis not present

## 2015-01-02 DIAGNOSIS — I509 Heart failure, unspecified: Secondary | ICD-10-CM | POA: Diagnosis not present

## 2015-01-02 DIAGNOSIS — Z791 Long term (current) use of non-steroidal anti-inflammatories (NSAID): Secondary | ICD-10-CM | POA: Insufficient documentation

## 2015-01-02 DIAGNOSIS — E079 Disorder of thyroid, unspecified: Secondary | ICD-10-CM | POA: Diagnosis not present

## 2015-01-02 DIAGNOSIS — G4733 Obstructive sleep apnea (adult) (pediatric): Secondary | ICD-10-CM | POA: Diagnosis not present

## 2015-01-02 DIAGNOSIS — R55 Syncope and collapse: Secondary | ICD-10-CM | POA: Diagnosis present

## 2015-01-02 DIAGNOSIS — E119 Type 2 diabetes mellitus without complications: Secondary | ICD-10-CM | POA: Diagnosis not present

## 2015-01-02 DIAGNOSIS — N390 Urinary tract infection, site not specified: Secondary | ICD-10-CM | POA: Diagnosis not present

## 2015-01-02 DIAGNOSIS — W19XXXA Unspecified fall, initial encounter: Secondary | ICD-10-CM

## 2015-01-02 LAB — CBC WITH DIFFERENTIAL/PLATELET
Basophils Absolute: 0 10*3/uL (ref 0.0–0.1)
Basophils Relative: 0 % (ref 0–1)
Eosinophils Absolute: 0 10*3/uL (ref 0.0–0.7)
Eosinophils Relative: 0 % (ref 0–5)
HCT: 46.4 % — ABNORMAL HIGH (ref 36.0–46.0)
Hemoglobin: 15.8 g/dL — ABNORMAL HIGH (ref 12.0–15.0)
Lymphocytes Relative: 21 % (ref 12–46)
Lymphs Abs: 2.7 10*3/uL (ref 0.7–4.0)
MCH: 32.4 pg (ref 26.0–34.0)
MCHC: 34.1 g/dL (ref 30.0–36.0)
MCV: 95.1 fL (ref 78.0–100.0)
MONO ABS: 0.6 10*3/uL (ref 0.1–1.0)
Monocytes Relative: 5 % (ref 3–12)
NEUTROS PCT: 74 % (ref 43–77)
Neutro Abs: 9.6 10*3/uL — ABNORMAL HIGH (ref 1.7–7.7)
Platelets: 362 10*3/uL (ref 150–400)
RBC: 4.88 MIL/uL (ref 3.87–5.11)
RDW: 12.8 % (ref 11.5–15.5)
WBC: 12.9 10*3/uL — AB (ref 4.0–10.5)

## 2015-01-02 LAB — URINE MICROSCOPIC-ADD ON

## 2015-01-02 LAB — BASIC METABOLIC PANEL
Anion gap: 12 (ref 5–15)
BUN: 20 mg/dL (ref 6–20)
CALCIUM: 9.6 mg/dL (ref 8.9–10.3)
CHLORIDE: 102 mmol/L (ref 101–111)
CO2: 25 mmol/L (ref 22–32)
Creatinine, Ser: 1.29 mg/dL — ABNORMAL HIGH (ref 0.44–1.00)
GFR calc non Af Amer: 44 mL/min — ABNORMAL LOW (ref >60)
GFR, EST AFRICAN AMERICAN: 51 mL/min — AB (ref >60)
Glucose, Bld: 173 mg/dL — ABNORMAL HIGH (ref 65–99)
POTASSIUM: 4.9 mmol/L (ref 3.5–5.1)
SODIUM: 139 mmol/L (ref 135–145)

## 2015-01-02 LAB — URINALYSIS, ROUTINE W REFLEX MICROSCOPIC
Bilirubin Urine: NEGATIVE
Glucose, UA: >1000 mg/dL — AB
Hgb urine dipstick: NEGATIVE
Ketones, ur: NEGATIVE mg/dL
Leukocytes, UA: NEGATIVE
NITRITE: NEGATIVE
PROTEIN: NEGATIVE mg/dL
Specific Gravity, Urine: 1.03 (ref 1.005–1.030)
Urobilinogen, UA: 1 mg/dL (ref 0.0–1.0)
pH: 5.5 (ref 5.0–8.0)

## 2015-01-02 LAB — BRAIN NATRIURETIC PEPTIDE: B Natriuretic Peptide: 16 pg/mL (ref 0.0–100.0)

## 2015-01-02 MED ORDER — CEPHALEXIN 250 MG PO CAPS
500.0000 mg | ORAL_CAPSULE | Freq: Once | ORAL | Status: AC
  Administered 2015-01-02: 500 mg via ORAL
  Filled 2015-01-02: qty 2

## 2015-01-02 MED ORDER — CEPHALEXIN 500 MG PO CAPS: 500.0000 mg | ORAL_CAPSULE | Freq: Four times a day (QID) | ORAL | Status: DC

## 2015-01-02 NOTE — ED Notes (Signed)
Pt reports syncope around 2am, fell again 3am in the bathroom. Hx of stroke last September. Bruise to right side of face, bilateral knees. Back pain. Daughter reports last week during giving her a shower, pt was more short of breath. Hx of CHF. Right side weakness, speech difficulties from previous stroke. No arm drift, grips equal, slight facial droop from previous stroke. Daugther reports no change in speech.

## 2015-01-02 NOTE — Discharge Instructions (Signed)
Fall Prevention and Home Safety  Falls cause injuries and can affect all age groups. It is possible to use preventive measures to significantly decrease the likelihood of falls. There are many simple measures which can make your home safer and prevent falls.  OUTDOORS   Repair cracks and edges of walkways and driveways.   Remove high doorway thresholds.   Trim shrubbery on the main path into your home.   Have good outside lighting.   Clear walkways of tools, rocks, debris, and clutter.   Check that handrails are not broken and are securely fastened. Both sides of steps should have handrails.   Have leaves, snow, and ice cleared regularly.   Use sand or salt on walkways during winter months.   In the garage, clean up grease or oil spills.  BATHROOM   Install night lights.   Install grab bars by the toilet and in the tub and shower.   Use non-skid mats or decals in the tub or shower.   Place a plastic non-slip stool in the shower to sit on, if needed.   Keep floors dry and clean up all water on the floor immediately.   Remove soap buildup in the tub or shower on a regular basis.   Secure bath mats with non-slip, double-sided rug tape.   Remove throw rugs and tripping hazards from the floors.  BEDROOMS   Install night lights.   Make sure a bedside light is easy to reach.   Do not use oversized bedding.   Keep a telephone by your bedside.   Have a firm chair with side arms to use for getting dressed.   Remove throw rugs and tripping hazards from the floor.  KITCHEN   Keep handles on pots and pans turned toward the center of the stove. Use back burners when possible.   Clean up spills quickly and allow time for drying.   Avoid walking on wet floors.   Avoid hot utensils and knives.   Position shelves so they are not too high or low.   Place commonly used objects within easy reach.   If necessary, use a sturdy step stool with a grab bar when reaching.   Keep electrical cables out of the  way.   Do not use floor polish or wax that makes floors slippery. If you must use wax, use non-skid floor wax.   Remove throw rugs and tripping hazards from the floor.  STAIRWAYS   Never leave objects on stairs.   Place handrails on both sides of stairways and use them. Fix any loose handrails. Make sure handrails on both sides of the stairways are as long as the stairs.   Check carpeting to make sure it is firmly attached along stairs. Make repairs to worn or loose carpet promptly.   Avoid placing throw rugs at the top or bottom of stairways, or properly secure the rug with carpet tape to prevent slippage. Get rid of throw rugs, if possible.   Have an electrician put in a light switch at the top and bottom of the stairs.  OTHER FALL PREVENTION TIPS   Wear low-heel or rubber-soled shoes that are supportive and fit well. Wear closed toe shoes.   When using a stepladder, make sure it is fully opened and both spreaders are firmly locked. Do not climb a closed stepladder.   Add color or contrast paint or tape to grab bars and handrails in your home. Place contrasting color strips on first and last   steps.   Learn and use mobility aids as needed. Install an electrical emergency response system.   Turn on lights to avoid dark areas. Replace light bulbs that burn out immediately. Get light switches that glow.   Arrange furniture to create clear pathways. Keep furniture in the same place.   Firmly attach carpet with non-skid or double-sided tape.   Eliminate uneven floor surfaces.   Select a carpet pattern that does not visually hide the edge of steps.   Be aware of all pets.  OTHER HOME SAFETY TIPS   Set the water temperature for 120 F (48.8 C).   Keep emergency numbers on or near the telephone.   Keep smoke detectors on every level of the home and near sleeping areas.  Document Released: 07/03/2002 Document Revised: 01/12/2012 Document Reviewed: 10/02/2011  ExitCare Patient Information 2015  [employer]. This information is not intended to replace advice given to you by your health care provider. Make sure you discuss any questions you have with your health care provider.    Urinary Tract Infection  Urinary tract infections (UTIs) can develop anywhere along your urinary tract. Your urinary tract is your body's drainage system for removing wastes and extra water. Your urinary tract includes two kidneys, two ureters, a bladder, and a urethra. Your kidneys are a pair of bean-shaped organs. Each kidney is about the size of your fist. They are located below your ribs, one on each side of your spine.  CAUSES  Infections are caused by microbes, which are microscopic organisms, including fungi, viruses, and bacteria. These organisms are so small that they can only be seen through a microscope. Bacteria are the microbes that most commonly cause UTIs.  SYMPTOMS   Symptoms of UTIs may vary by age and gender of the patient and by the location of the infection. Symptoms in young women typically include a frequent and intense urge to urinate and a painful, burning feeling in the bladder or urethra during urination. Older women and men are more likely to be tired, shaky, and weak and have muscle aches and abdominal pain. A fever may mean the infection is in your kidneys. Other symptoms of a kidney infection include pain in your back or sides below the ribs, nausea, and vomiting.  DIAGNOSIS  To diagnose a UTI, your caregiver will ask you about your symptoms. Your caregiver also will ask to provide a urine sample. The urine sample will be tested for bacteria and white blood cells. White blood cells are made by your body to help fight infection.  TREATMENT   Typically, UTIs can be treated with medication. Because most UTIs are caused by a bacterial infection, they usually can be treated with the use of antibiotics. The choice of antibiotic and length of treatment depend on your symptoms and the type of bacteria causing  your infection.  HOME CARE INSTRUCTIONS   If you were prescribed antibiotics, take them exactly as your caregiver instructs you. Finish the medication even if you feel better after you have only taken some of the medication.   Drink enough water and fluids to keep your urine clear or pale yellow.   Avoid caffeine, tea, and carbonated beverages. They tend to irritate your bladder.   Empty your bladder often. Avoid holding urine for long periods of time.   Empty your bladder before and after sexual intercourse.   After a bowel movement, women should cleanse from front to back. Use each tissue only once.  SEEK MEDICAL CARE   IF:    You have back pain.   You develop a fever.   Your symptoms do not begin to resolve within 3 days.  SEEK IMMEDIATE MEDICAL CARE IF:    You have severe back pain or lower abdominal pain.   You develop chills.   You have nausea or vomiting.   You have continued burning or discomfort with urination.  MAKE SURE YOU:    Understand these instructions.   Will watch your condition.   Will get help right away if you are not doing well or get worse.  Document Released: 04/22/2005 Document Revised: 01/12/2012 Document Reviewed: 08/21/2011  Exit[employer] Patient Information 2015 [employer]. This information is not intended to replace advice given to you by your health care provider. Make sure you discuss any questions you have with your health care provider.

## 2015-01-02 NOTE — ED Provider Notes (Signed)
CSN: [id number]     Arrival date & time 01/02/15  1761 History   First MD Initiated Contact with Patient 01/02/15 0912     Chief Complaint  Patient presents with  . Loss of Consciousness     (Consider location/radiation/quality/duration/timing/severity/associated sxs/prior Treatment) HPI   Abigail Johns is a 64 y.o. female who is here for evaluation of fall, 2, with possible syncope. She injured her right cheek, both knees and back, in the falls. She feels like she might of passed out on the first fall. Patient lives in a home with her daughter. Her daughter was unaware of the falls until the patient told her about them this morning. At that time, the patient was ambulatory, alert and calm. She had not eaten prior to the falls, and currently is hungry now. She denies headache, chest pain, nausea, vomiting, dysuria or hematuria. She has had some urinary frequency recently. She states that her sugars have been running relatively normal. She saw her neurologist for a regular follow-up, 6 days ago. At that time it was noticed that she was doing well without seizures, off Keppra. Her regular treatment protocol was continued. She's not seen her primary care doctor recently. There are no other known modifying factors.   Past Medical History  Diagnosis Date  . CHF (congestive heart failure)   . Hypertension   . High cholesterol   . Thyroid disease   . Diabetes mellitus   . Back pain, chronic   . Renal disorder   . Obesity   . OSA on CPAP   . Edema    Past Surgical History  Procedure Laterality Date  . Nephrolithotomy      Right  . Abdominal hysterectomy    . Eye surgery    . Bunionectomy      both feet  . Left knee surgery      Torn meniscus  . US echocardiography  12/24/2011    mild LVH,mild TR,trace MR,PI  . Laparoscopic ovarian cystectomy Right 11/01/2013    Procedure: LAPAROSCOPIC RIGHT OVARIAN CYSTECTOMY WITH COLLECTION OF WASHINGS ;  Surgeon: Ena Dawley, MD;  Location: [facility];  Service: Gynecology;  Laterality: Right;  . Laparoscopic lysis of adhesions N/A 11/01/2013    Procedure: LAPAROSCOPIC LYSIS OF ADHESIONS;  Surgeon: Ena Dawley, MD;  Location: [facility];  Service: Gynecology;  Laterality: N/A;  . Laparoscopy N/A 11/01/2013    Procedure: LAPAROSCOPY DIAGNOSTIC;  Surgeon: Ena Dawley, MD;  Location: [facility];  Service: Gynecology;  Laterality: N/A;  . Tee without cardioversion N/A 05/11/2014    Procedure: TRANSESOPHAGEAL ECHOCARDIOGRAM (TEE);  Surgeon: Sanda Klein, MD;  Location: [facility];  Service: Cardiovascular;  Laterality: N/A;  . Loop recorder implant N/A 05/11/2014    Procedure: LOOP RECORDER IMPLANT;  Surgeon: Coralyn Mark, MD;  Location: [facility];  Service: Cardiovascular;  Laterality: N/A;   Family History  Problem Relation Age of Onset  . Diabetes Mother   . Hyperlipidemia Mother   . Hypertension Mother   . Hypertension Father   . Diabetes Father   . Cancer Father   . Heart attack Brother   . Hyperlipidemia Brother   . Hypertension Brother    History  Substance Use Topics  . Smoking status: Former Smoker    Quit date: 07/26/2002  . Smokeless tobacco: Never Used  . Alcohol Use: No   OB History    No data available     Review of Systems  All other systems reviewed and are  negative.     Allergies  Lipitor and Crestor  Home Medications   Prior to Admission medications   Medication Sig Start Date End Date Taking? Authorizing Provider  allopurinol (ZYLOPRIM) 100 MG tablet Take 100 mg by mouth daily.  05/21/14  Yes Historical Provider, MD  buPROPion (WELLBUTRIN) 100 MG tablet Take 100 mg by mouth 3 (three) times daily.   Yes Historical Provider, MD  Canagliflozin (INVOKANA) 300 MG TABS Take 300 mg by mouth daily.    Yes Historical Provider, MD  clopidogrel (PLAVIX) 75 MG tablet Take 1 tablet (75 mg total) by mouth daily. 09/18/14  Yes Rosalin Hawking, MD  cycloSPORINE (RESTASIS) 0.05 % ophthalmic emulsion Place 1 drop  into both eyes 2 (two) times daily.   Yes Historical Provider, MD  escitalopram (LEXAPRO) 5 MG tablet Take 5 mg by mouth daily.  04/26/14  Yes Historical Provider, MD  ezetimibe (ZETIA) 10 MG tablet Take 10 mg by mouth daily.   Yes Historical Provider, MD  ferrous sulfate 325 (65 FE) MG tablet Take 325 mg by mouth daily with breakfast.   Yes Historical Provider, MD  furosemide (LASIX) 80 MG tablet Take 80 mg by mouth daily. 11/13/13  Yes Mihai Croitoru, MD  gabapentin (NEURONTIN) 600 MG tablet Take 600 mg by mouth at bedtime.   Yes Historical Provider, MD  glyBURIDE (DIABETA) 5 MG tablet Take 5 mg by mouth daily with supper.   Yes Historical Provider, MD  levothyroxine (SYNTHROID, LEVOTHROID) 75 MCG tablet Take 75 mcg by mouth daily.   Yes Historical Provider, MD  meloxicam (MOBIC) 15 MG tablet Take 15 mg by mouth daily.   Yes Historical Provider, MD  metFORMIN (GLUCOPHAGE) 500 MG tablet Take 500 mg by mouth 2 (two) times daily with a meal.   Yes Historical Provider, MD  methylphenidate (CONCERTA) 36 MG CR tablet Take 36 mg by mouth every morning.   Yes Historical Provider, MD  mupirocin cream (BACTROBAN) 2 % Apply topically 2 (two) times daily. Patient taking differently: Apply 1 application topically 2 (two) times daily as needed (redness/irritation).  05/11/14  Yes Kelvin Cellar, MD  nebivolol (BYSTOLIC) 5 MG tablet Take 5 mg by mouth daily.   Yes Historical Provider, MD  pantoprazole (PROTONIX) 40 MG tablet Take 40 mg by mouth daily.   Yes Historical Provider, MD  potassium chloride SA (K-DUR,KLOR-CON) 20 MEQ tablet Take 40 mEq by mouth daily.    Yes Historical Provider, MD  spironolactone (ALDACTONE) 50 MG tablet Take 50 mg by mouth daily.   Yes Historical Provider, MD  temazepam (RESTORIL) 30 MG capsule Take 30 mg by mouth at bedtime as needed for sleep.   Yes Historical Provider, MD  Vitamin D, Ergocalciferol, (DRISDOL) 50000 UNITS CAPS Take 50,000 Units by mouth 2 (two) times a week.  Mondays and fridays   Yes Historical Provider, MD  [facility] 3.75 G PACK Take 3.75 g by mouth daily.  05/04/14  Yes Historical Provider, MD  cephALEXin (KEFLEX) 500 MG capsule Take 1 capsule (500 mg total) by mouth 4 (four) times daily. 01/02/15   Daleen Bo, MD  lidocaine (LIDODERM) 5 % Place 1 patch onto the skin as needed (pain). Remove & Discard patch within 12 hours or as directed by MD    Historical Provider, MD  [facility] VERIO test strip  05/21/14   Historical Provider, MD   BP 109/63 mmHg  Pulse 70  Temp(Src) 98.3 F (36.8 C)  Resp 22  Ht 5\' 5"  (1.651 m)  Wt 197 lb (89.359 kg)  BMI 32.78 kg/m2  SpO2 95% Physical Exam  Constitutional: She is oriented to person, place, and time. She appears well-developed.  Elderly appearing, older than stated age.  HENT:  Head: Normocephalic and atraumatic.  Right Ear: External ear normal.  Left Ear: External ear normal.  Eyes: Conjunctivae and EOM are normal.  Reactive left pupil. OD prosthesis  Neck: Normal range of motion and phonation normal. Neck supple.  Cardiovascular: Normal rate, regular rhythm and normal heart sounds.   Pulmonary/Chest: Effort normal and breath sounds normal. She exhibits no bony tenderness.  Abdominal: Soft. There is no tenderness.  Musculoskeletal: Normal range of motion.  Small contusions, right cheek and both knees, however, normal range of motion right TMJ, and both knees, both actively and passively.  Neurological: She is alert and oriented to person, place, and time. No cranial nerve deficit or sensory deficit. She exhibits normal muscle tone. Coordination normal.  Right hemiplegia, arm greater than leg. Dysarthria is present. No aphasia. No nystagmus noted.  Skin: Skin is warm, dry and intact.  Psychiatric: She has a normal mood and affect. Her behavior is normal. Judgment and thought content normal.  Nursing note and vitals reviewed.   ED Course  Procedures (including critical care  time)    Medications  cephALEXin (KEFLEX) capsule 500 mg (not administered)    Patient Vitals for the past 24 hrs:  BP Temp Pulse Resp SpO2 Height Weight  01/02/15 1145 109/63 mmHg - 70 22 95 % - -  01/02/15 1130 (!) 104/36 mmHg - 62 21 97 % - -  01/02/15 1115 103/56 mmHg - (!) 58 18 100 % - -  01/02/15 1112 (!) 111/50 mmHg - (!) 57 20 100 % - -  01/02/15 1045 109/61 mmHg - 67 16 99 % - -  01/02/15 1015 105/58 mmHg - 66 20 97 % - -  01/02/15 0930 113/65 mmHg - 70 (!) 27 95 % - -  01/02/15 0902 107/59 mmHg 98.3 F (36.8 C) 85 16 98 % 5\' 5"  (1.651 m) 197 lb (89.359 kg)   Loop recorder interrogation, done in the emergency department today. There've been no episodes of atrial fibrillation. He does have occasional brief episodes of marked tachycardia, however, none in the last several days.  9:32 AM Reevaluation with update and discussion. After initial assessment and treatment, an updated evaluation reveals she is tolerating liquids at this time. Keflex ordered. Findings discussed with patient and family member, all questions answered. Alvin Rubano L    Labs Review Labs Reviewed  BASIC METABOLIC PANEL - Abnormal; Notable for the following:    Glucose, Bld 173 (*)    Creatinine, Ser 1.29 (*)    GFR calc non Af Amer 44 (*)    GFR calc Af Amer 51 (*)    All other components within normal limits  CBC WITH DIFFERENTIAL/PLATELET - Abnormal; Notable for the following:    WBC 12.9 (*)    Hemoglobin 15.8 (*)    HCT 46.4 (*)    Neutro Abs 9.6 (*)    All other components within normal limits  URINALYSIS, ROUTINE W REFLEX MICROSCOPIC (NOT AT [facility]) - Abnormal; Notable for the following:    Glucose, UA >1000 (*)    All other components within normal limits  URINE MICROSCOPIC-ADD ON - Abnormal; Notable for the following:    Squamous Epithelial / LPF MANY (*)    Bacteria, UA FEW (*)    All other components within normal  limits  URINE CULTURE  BRAIN NATRIURETIC PEPTIDE    Imaging  Review Dg Chest 2 View  01/02/2015   CLINICAL DATA:  64 year old female who fell. Slurred speech. Hypertension diabetes. Initial encounter.  EXAM: CHEST  2 VIEW  COMPARISON:  05/09/2014 and earlier.  FINDINGS: Small anterior chest wall cardiac event recorder now in place. Low lung volumes. Stable cardiac size at the upper limits of normal to mildly enlarged. Other mediastinal contours are within normal limits. Visualized tracheal air column is within normal limits. The lungs are clear. No pneumothorax or effusion. No acute osseous abnormality identified.  IMPRESSION: Low lung volumes, otherwise no acute cardiopulmonary abnormality.   Electronically Signed   By: Genevie Ann M.D.   On: 01/02/2015 10:00     EKG Interpretation   Date/Time:  Wednesday January 02 2015 08:59:17 EDT Ventricular Rate:  82 PR Interval:  164 QRS Duration: 80 QT Interval:  366 QTC Calculation: 427 R Axis:   58 Text Interpretation:  Normal sinus rhythm Cannot rule out Anterior infarct  , age undetermined Abnormal ECG since last tracing no significant change  Confirmed by Eulis Foster  MD, Saquoia Sianez [phone]) on 01/02/2015 9:12:40 AM      MDM   Final diagnoses:  UTI (lower urinary tract infection)  Fall, initial encounter    Fall today, without serious injury. No clear evidence for syncope. Evaluation of headache status does not indicate any worrisome conditions. I doubt ACS, new onset atrial fibrillation, tears bacterial infection, metabolic instability or suggestion for impending vascular collapse.   Nursing Notes Reviewed/ Care Coordinated Applicable Imaging Reviewed Interpretation of Laboratory Data incorporated into ED treatment  The patient appears reasonably screened and/or stabilized for discharge and I doubt any other medical condition or other [facility] requiring further screening, evaluation, or treatment in the ED at this time prior to discharge.  Plan: Home Medications- Keflex; Home Treatments- rest, fluids; return here if  the recommended treatment, does not improve the symptoms; Recommended follow up- PCP follow-up in one week for recheck.     Daleen Bo, MD 01/02/15 [phone]

## 2015-01-02 NOTE — ED Notes (Signed)
Patient transported to X-ray 

## 2015-01-03 LAB — URINE CULTURE
Colony Count: 30000
Special Requests: NORMAL

## 2015-01-07 ENCOUNTER — Ambulatory Visit (INDEPENDENT_AMBULATORY_CARE_PROVIDER_SITE_OTHER): Payer: 59 | Admitting: *Deleted

## 2015-01-07 ENCOUNTER — Encounter: Payer: Self-pay | Admitting: Cardiovascular Disease

## 2015-01-07 DIAGNOSIS — I63412 Cerebral infarction due to embolism of left middle cerebral artery: Secondary | ICD-10-CM

## 2015-01-07 LAB — CUP PACEART REMOTE DEVICE CHECK
Date Time Interrogation Session: 20160608150639
MDC IDC SET ZONE DETECTION INTERVAL: 2000 ms
Zone Setting Detection Interval: 3000 ms
Zone Setting Detection Interval: 360 ms

## 2015-01-07 NOTE — Progress Notes (Signed)
Loop recorder 

## 2015-01-08 ENCOUNTER — Encounter: Payer: Self-pay | Admitting: Cardiovascular Disease

## 2015-01-18 ENCOUNTER — Encounter: Payer: Self-pay | Admitting: Cardiovascular Disease

## 2015-01-22 ENCOUNTER — Encounter: Payer: Self-pay | Admitting: Cardiology

## 2015-01-23 ENCOUNTER — Emergency Department (HOSPITAL_COMMUNITY): Payer: 59

## 2015-01-23 ENCOUNTER — Emergency Department (HOSPITAL_COMMUNITY)
Admission: EM | Admit: 2015-01-23 | Discharge: 2015-01-24 | Disposition: A | Discharge status: Home / Self Care | Payer: 59 | Attending: Emergency Medicine | Admitting: Emergency Medicine

## 2015-01-23 ENCOUNTER — Encounter (HOSPITAL_COMMUNITY): Payer: Self-pay | Admitting: *Deleted

## 2015-01-23 DIAGNOSIS — Y939 Activity, unspecified: Secondary | ICD-10-CM | POA: Insufficient documentation

## 2015-01-23 DIAGNOSIS — S0993XA Unspecified injury of face, initial encounter: Secondary | ICD-10-CM | POA: Diagnosis not present

## 2015-01-23 DIAGNOSIS — Z9981 Dependence on supplemental oxygen: Secondary | ICD-10-CM | POA: Diagnosis not present

## 2015-01-23 DIAGNOSIS — S79912A Unspecified injury of left hip, initial encounter: Secondary | ICD-10-CM | POA: Diagnosis present

## 2015-01-23 DIAGNOSIS — Y929 Unspecified place or not applicable: Secondary | ICD-10-CM | POA: Diagnosis not present

## 2015-01-23 DIAGNOSIS — S7002XA Contusion of left hip, initial encounter: Secondary | ICD-10-CM | POA: Diagnosis not present

## 2015-01-23 DIAGNOSIS — G8929 Other chronic pain: Secondary | ICD-10-CM | POA: Insufficient documentation

## 2015-01-23 DIAGNOSIS — E669 Obesity, unspecified: Secondary | ICD-10-CM | POA: Diagnosis not present

## 2015-01-23 DIAGNOSIS — I10 Essential (primary) hypertension: Secondary | ICD-10-CM | POA: Diagnosis not present

## 2015-01-23 DIAGNOSIS — M25552 Pain in left hip: Secondary | ICD-10-CM

## 2015-01-23 DIAGNOSIS — W19XXXA Unspecified fall, initial encounter: Secondary | ICD-10-CM

## 2015-01-23 DIAGNOSIS — Z87448 Personal history of other diseases of urinary system: Secondary | ICD-10-CM | POA: Diagnosis not present

## 2015-01-23 DIAGNOSIS — E780 Pure hypercholesterolemia: Secondary | ICD-10-CM | POA: Diagnosis not present

## 2015-01-23 DIAGNOSIS — Z87891 Personal history of nicotine dependence: Secondary | ICD-10-CM | POA: Insufficient documentation

## 2015-01-23 DIAGNOSIS — S299XXA Unspecified injury of thorax, initial encounter: Secondary | ICD-10-CM | POA: Insufficient documentation

## 2015-01-23 DIAGNOSIS — G4733 Obstructive sleep apnea (adult) (pediatric): Secondary | ICD-10-CM | POA: Diagnosis not present

## 2015-01-23 DIAGNOSIS — Z792 Long term (current) use of antibiotics: Secondary | ICD-10-CM | POA: Diagnosis not present

## 2015-01-23 DIAGNOSIS — E079 Disorder of thyroid, unspecified: Secondary | ICD-10-CM | POA: Insufficient documentation

## 2015-01-23 DIAGNOSIS — S8002XA Contusion of left knee, initial encounter: Secondary | ICD-10-CM | POA: Diagnosis not present

## 2015-01-23 DIAGNOSIS — E119 Type 2 diabetes mellitus without complications: Secondary | ICD-10-CM | POA: Diagnosis not present

## 2015-01-23 DIAGNOSIS — I509 Heart failure, unspecified: Secondary | ICD-10-CM | POA: Diagnosis not present

## 2015-01-23 DIAGNOSIS — Z79899 Other long term (current) drug therapy: Secondary | ICD-10-CM | POA: Diagnosis not present

## 2015-01-23 DIAGNOSIS — Z791 Long term (current) use of non-steroidal anti-inflammatories (NSAID): Secondary | ICD-10-CM | POA: Insufficient documentation

## 2015-01-23 DIAGNOSIS — W050XXA Fall from non-moving wheelchair, initial encounter: Secondary | ICD-10-CM | POA: Diagnosis not present

## 2015-01-23 DIAGNOSIS — Y999 Unspecified external cause status: Secondary | ICD-10-CM | POA: Insufficient documentation

## 2015-01-23 LAB — BASIC METABOLIC PANEL
Anion gap: 17 — ABNORMAL HIGH (ref 5–15)
BUN: 18 mg/dL (ref 6–20)
CHLORIDE: 98 mmol/L — AB (ref 101–111)
CO2: 21 mmol/L — ABNORMAL LOW (ref 22–32)
CREATININE: 1.24 mg/dL — AB (ref 0.44–1.00)
Calcium: 10 mg/dL (ref 8.9–10.3)
GFR calc Af Amer: 53 mL/min — ABNORMAL LOW (ref >60)
GFR calc non Af Amer: 46 mL/min — ABNORMAL LOW (ref >60)
GLUCOSE: 99 mg/dL (ref 65–99)
Potassium: 3.8 mmol/L (ref 3.5–5.1)
Sodium: 136 mmol/L (ref 135–145)

## 2015-01-23 LAB — URINALYSIS, ROUTINE W REFLEX MICROSCOPIC
Bilirubin Urine: NEGATIVE
Glucose, UA: >1000 mg/dL — AB
HGB URINE DIPSTICK: NEGATIVE
Ketones, ur: 15 mg/dL — AB
LEUKOCYTES UA: NEGATIVE
Nitrite: NEGATIVE
Protein, ur: NEGATIVE mg/dL
SPECIFIC GRAVITY, URINE: 1.016 (ref 1.005–1.030)
UROBILINOGEN UA: 1 mg/dL (ref 0.0–1.0)
pH: 6.5 (ref 5.0–8.0)

## 2015-01-23 LAB — I-STAT CHEM 8, ED
BUN: 23 mg/dL — ABNORMAL HIGH (ref 6–20)
Calcium, Ion: 1.06 mmol/L — ABNORMAL LOW (ref 1.13–1.30)
Chloride: 101 mmol/L (ref 101–111)
Creatinine, Ser: 1.2 mg/dL — ABNORMAL HIGH (ref 0.44–1.00)
Glucose, Bld: 97 mg/dL (ref 65–99)
HCT: 57 % — ABNORMAL HIGH (ref 36.0–46.0)
Hemoglobin: 19.4 g/dL — ABNORMAL HIGH (ref 12.0–15.0)
Potassium: 3.8 mmol/L (ref 3.5–5.1)
Sodium: 138 mmol/L (ref 135–145)
TCO2: 24 mmol/L (ref 0–100)

## 2015-01-23 LAB — URINE MICROSCOPIC-ADD ON

## 2015-01-23 LAB — CBC
HCT: 47.5 % — ABNORMAL HIGH (ref 36.0–46.0)
Hemoglobin: 17.1 g/dL — ABNORMAL HIGH (ref 12.0–15.0)
MCH: 33.2 pg (ref 26.0–34.0)
MCHC: 36 g/dL (ref 30.0–36.0)
MCV: 92.2 fL (ref 78.0–100.0)
Platelets: 314 10*3/uL (ref 150–400)
RBC: 5.15 MIL/uL — ABNORMAL HIGH (ref 3.87–5.11)
RDW: 12.8 % (ref 11.5–15.5)
WBC: 7.6 10*3/uL (ref 4.0–10.5)

## 2015-01-23 MED ORDER — FENTANYL CITRATE (PF) 100 MCG/2ML IJ SOLN
100.0000 ug | INTRAMUSCULAR | Status: DC | PRN
  Administered 2015-01-23: 100 ug via INTRAVENOUS
  Filled 2015-01-23: qty 2

## 2015-01-23 MED ORDER — ONDANSETRON HCL 4 MG/2ML IJ SOLN
4.0000 mg | Freq: Once | INTRAMUSCULAR | Status: AC
  Administered 2015-01-23: 4 mg via INTRAVENOUS
  Filled 2015-01-23: qty 2

## 2015-01-23 NOTE — ED Notes (Signed)
Patient is in xray 

## 2015-01-23 NOTE — ED Provider Notes (Signed)
CSN: [id number]     Arrival date & time 01/23/15  1830 History   First MD Initiated Contact with Patient 01/23/15 1938     Chief Complaint  Patient presents with  . Fall     (Consider location/radiation/quality/duration/timing/severity/associated sxs/prior Treatment) HPI   Abigail Johns is a 64 y.o. female presents for evaluation of injuries and fall. She was sitting in a chair, had a glassy look and dry, then fell forward striking the floor. This was observed by her daughter. Her daughter states that she did not lose consciousness, either before or after the fall. She complains of pain in her left knee, left hip and left jaw. She was able to eat earlier today, and has not had any recent illnesses. I saw her in the emergency department about a month ago after a fall when she was evaluated for syncope. She's been doing well, since then. There've been no other recent illnesses including cough, shortness of breath, nausea, vomiting, change in bowel or urinary habits. She has had some occasional burning with urination, but no hematuria. There are no other known modifying factors.  Past Medical History  Diagnosis Date  . CHF (congestive heart failure)   . Hypertension   . High cholesterol   . Thyroid disease   . Diabetes mellitus   . Back pain, chronic   . Renal disorder   . Obesity   . OSA on CPAP   . Edema    Past Surgical History  Procedure Laterality Date  . Nephrolithotomy      Right  . Abdominal hysterectomy    . Eye surgery    . Bunionectomy      both feet  . Left knee surgery      Torn meniscus  . US echocardiography  12/24/2011    mild LVH,mild TR,trace MR,PI  . Laparoscopic ovarian cystectomy Right 11/01/2013    Procedure: LAPAROSCOPIC RIGHT OVARIAN CYSTECTOMY WITH COLLECTION OF WASHINGS ;  Surgeon: Ena Dawley, MD;  Location: [facility];  Service: Gynecology;  Laterality: Right;  . Laparoscopic lysis of adhesions N/A 11/01/2013    Procedure: LAPAROSCOPIC LYSIS OF  ADHESIONS;  Surgeon: Ena Dawley, MD;  Location: [facility];  Service: Gynecology;  Laterality: N/A;  . Laparoscopy N/A 11/01/2013    Procedure: LAPAROSCOPY DIAGNOSTIC;  Surgeon: Ena Dawley, MD;  Location: [facility];  Service: Gynecology;  Laterality: N/A;  . Tee without cardioversion N/A 05/11/2014    Procedure: TRANSESOPHAGEAL ECHOCARDIOGRAM (TEE);  Surgeon: Sanda Klein, MD;  Location: [facility];  Service: Cardiovascular;  Laterality: N/A;  . Loop recorder implant N/A 05/11/2014    Procedure: LOOP RECORDER IMPLANT;  Surgeon: Coralyn Mark, MD;  Location: [facility];  Service: Cardiovascular;  Laterality: N/A;   Family History  Problem Relation Age of Onset  . Diabetes Mother   . Hyperlipidemia Mother   . Hypertension Mother   . Hypertension Father   . Diabetes Father   . Cancer Father   . Heart attack Brother   . Hyperlipidemia Brother   . Hypertension Brother    History  Substance Use Topics  . Smoking status: Former Smoker    Quit date: 07/26/2002  . Smokeless tobacco: Never Used  . Alcohol Use: No   OB History    No data available     Review of Systems  All other systems reviewed and are negative.     Allergies  Lipitor and Crestor  Home Medications   Prior to Admission medications   Medication Sig  Start Date End Date Taking? Authorizing Provider  allopurinol (ZYLOPRIM) 100 MG tablet Take 100 mg by mouth daily.  05/21/14   Historical Provider, MD  buPROPion (WELLBUTRIN) 100 MG tablet Take 100 mg by mouth 3 (three) times daily.    Historical Provider, MD  Canagliflozin (INVOKANA) 300 MG TABS Take 300 mg by mouth daily.     Historical Provider, MD  cephALEXin (KEFLEX) 500 MG capsule Take 1 capsule (500 mg total) by mouth 4 (four) times daily. 01/02/15   Daleen Bo, MD  clopidogrel (PLAVIX) 75 MG tablet Take 1 tablet (75 mg total) by mouth daily. 09/18/14   Rosalin Hawking, MD  cycloSPORINE (RESTASIS) 0.05 % ophthalmic emulsion Place 1 drop into both eyes 2 (two)  times daily.    Historical Provider, MD  escitalopram (LEXAPRO) 5 MG tablet Take 5 mg by mouth daily.  04/26/14   Historical Provider, MD  ezetimibe (ZETIA) 10 MG tablet Take 10 mg by mouth daily.    Historical Provider, MD  ferrous sulfate 325 (65 FE) MG tablet Take 325 mg by mouth daily with breakfast.    Historical Provider, MD  furosemide (LASIX) 80 MG tablet Take 80 mg by mouth daily. 11/13/13   Mihai Croitoru, MD  gabapentin (NEURONTIN) 600 MG tablet Take 600 mg by mouth at bedtime.    Historical Provider, MD  glyBURIDE (DIABETA) 5 MG tablet Take 5 mg by mouth daily with supper.    Historical Provider, MD  levothyroxine (SYNTHROID, LEVOTHROID) 75 MCG tablet Take 75 mcg by mouth daily.    Historical Provider, MD  lidocaine (LIDODERM) 5 % Place 1 patch onto the skin as needed (pain). Remove & Discard patch within 12 hours or as directed by MD    Historical Provider, MD  meloxicam (MOBIC) 15 MG tablet Take 15 mg by mouth daily.    Historical Provider, MD  metFORMIN (GLUCOPHAGE) 500 MG tablet Take 500 mg by mouth 2 (two) times daily with a meal.    Historical Provider, MD  methylphenidate (CONCERTA) 36 MG CR tablet Take 36 mg by mouth every morning.    Historical Provider, MD  mupirocin cream (BACTROBAN) 2 % Apply topically 2 (two) times daily. Patient taking differently: Apply 1 application topically 2 (two) times daily as needed (redness/irritation).  05/11/14   Kelvin Cellar, MD  nebivolol (BYSTOLIC) 5 MG tablet Take 5 mg by mouth daily.    Historical Provider, MD  [facility] VERIO test strip  05/21/14   Historical Provider, MD  pantoprazole (PROTONIX) 40 MG tablet Take 40 mg by mouth daily.    Historical Provider, MD  potassium chloride SA (K-DUR,KLOR-CON) 20 MEQ tablet Take 40 mEq by mouth daily.     Historical Provider, MD  spironolactone (ALDACTONE) 50 MG tablet Take 50 mg by mouth daily.    Historical Provider, MD  temazepam (RESTORIL) 30 MG capsule Take 30 mg by mouth at bedtime as needed  for sleep.    Historical Provider, MD  Vitamin D, Ergocalciferol, (DRISDOL) 50000 UNITS CAPS Take 50,000 Units by mouth 2 (two) times a week. Mondays and fridays    Historical Provider, MD  [facility] 3.75 G PACK Take 3.75 g by mouth daily.  05/04/14   Historical Provider, MD   BP 107/66 mmHg  Pulse 52  Temp(Src) 98.9 F (37.2 C) (Axillary)  Resp 19  Ht 5\' 5"  (1.651 m)  Wt 197 lb (89.359 kg)  BMI 32.78 kg/m2  SpO2 100% Physical Exam  Constitutional: She is oriented to person, place,  and time. She appears well-developed.  She appears older than stated age.  HENT:  Head: Normocephalic and atraumatic.  Right Ear: External ear normal.  Left Ear: External ear normal.  Eyes: Conjunctivae and EOM are normal. Pupils are equal, round, and reactive to light.  Neck: Normal range of motion and phonation normal. Neck supple.  Cardiovascular: Normal rate, regular rhythm and normal heart sounds.   Pulmonary/Chest: Effort normal and breath sounds normal. She exhibits tenderness (Left lateral, without crepitation or deformity.). She exhibits no bony tenderness.  Abdominal: Soft. There is no tenderness.  Musculoskeletal: Normal range of motion.  Mild tenderness left hip without deformity. Moderate tenderness left knee with mild anterior swelling, but no deformity. No tenderness of the cervical, thoracic or lumbar spine regions.  Neurological: She is alert and oriented to person, place, and time. No cranial nerve deficit or sensory deficit. She exhibits normal muscle tone. Coordination normal.  Skin: Skin is warm, dry and intact.  Psychiatric: She has a normal mood and affect. Her behavior is normal.  Nursing note and vitals reviewed.   ED Course  Procedures (including critical care time)  I assisted the nursing staff with clearance from the backboard.  Loop recorder was interrogated, and there were no events found.  Medications  fentaNYL (SUBLIMAZE) injection 100 mcg (100 mcg Intravenous Given  01/23/15 2037)  ondansetron [facility]) injection 4 mg (4 mg Intravenous Given 01/23/15 2037)    Patient Vitals for the past 24 hrs:  BP Temp Temp src Pulse Resp SpO2 Height Weight  01/23/15 2035 92/72 mmHg - - 72 16 100 % - -  01/23/15 2000 (!) 106/52 mmHg - - 63 - 100 % - -  01/23/15 1944 (!) 90/50 mmHg - - (!) 54 (!) 28 100 % - -  01/23/15 1900 101/69 mmHg - - (!) 57 18 100 % - -  01/23/15 1835 107/66 mmHg 98.9 F (37.2 C) Axillary (!) 52 19 100 % 5\' 5"  (1.651 m) 197 lb (89.359 kg)   Knee sleeve, left knee, applied by nursing staff.  11:31 PM Reevaluation with update and discussion. After initial assessment and treatment, an updated evaluation reveals no additional complaints. Ambulation trial. Abigail Johns    Labs Review Labs Reviewed  BASIC METABOLIC PANEL  CBC  I-STAT CHEM 8, ED    Imaging Review Dg Chest 2 View  01/23/2015   CLINICAL DATA:  [place] at home tonight.  EXAM: CHEST  2 VIEW  COMPARISON:  01/02/2015  FINDINGS: There are low lung volumes. There is no significant airspace opacity. There is no effusion. Hilar, mediastinal and cardiac contours are unremarkable and unchanged. Pulmonary vasculature is normal.  IMPRESSION: No active cardiopulmonary disease.   Electronically Signed   By: Andreas Newport M.D.   On: 01/23/2015 21:47   Dg Knee Complete 4 Views Left  01/23/2015   CLINICAL DATA:  [place] at home tonight.  Lateral left knee pain.  EXAM: LEFT KNEE - COMPLETE 4+ VIEW  COMPARISON:  09/05/2011  FINDINGS: Negative for acute fracture or dislocation. Moderate osteoarthritic changes are present, greatest in the medial compartment. There is no bone lesion or bony destruction. No significant interval change is evident.  IMPRESSION: Negative for acute fracture   Electronically Signed   By: Andreas Newport M.D.   On: 01/23/2015 21:48   Dg Hip Unilat With Pelvis 2-3 Views Left  01/23/2015   CLINICAL DATA:  Posterior left hip and knee pain after falling today. Initial encounter.   EXAM: LEFT HIP (  WITH PELVIS) 2-3 VIEWS  COMPARISON:  None.  FINDINGS: The mineralization and alignment are normal. There is no evidence of acute fracture or dislocation. There is no evidence of femoral head avascular necrosis. The hip joint spaces are maintained. Scattered pelvic phleboliths are noted.  IMPRESSION: No acute osseous findings or significant arthropathic changes.   Electronically Signed   By: Richardean Sale M.D.   On: 01/23/2015 21:48     EKG Interpretation None      MDM   Final diagnoses:  Left hip pain  Fall, initial encounter  Contusion of left hip, initial encounter  Contusion of left knee, initial encounter    Fall, cause not clear, without serious injury. Doubt syncope, cardiac arrhythmia, serous bacterial infection or metabolic instability.   Nursing Notes Reviewed/ Care Coordinated Applicable Imaging Reviewed Interpretation of Laboratory Data incorporated into ED treatment  The patient appears reasonably screened and/or stabilized for discharge and I doubt any other medical condition or other [facility] requiring further screening, evaluation, or treatment in the ED at this time prior to discharge.  Plan: Home Medications- usual; Home Treatments- Knee brace for comfort; return here if the recommended treatment, does not improve the symptoms; Recommended follow up- PCP 1 week     Daleen Bo, MD 01/24/15 0020

## 2015-01-23 NOTE — ED Notes (Signed)
Pt was in a chair in the kitchen, stood up and was noted to have a "deer in headlights look on her face" and she fell and hit her left side of the face on the sink. Pt reports left sided facial pain, left flank pain and left leg pain.

## 2015-01-23 NOTE — ED Notes (Signed)
Pt presents via [facility] for a fall out of her wheelchair.  Pt fell forward out of wheelchair onto left side, LOC for approximately 10 secs witnessed by daughter.  Pt reports feeling dizzy before episode.  Pt reporting left sided flank and left sided leg pain.  Small abrasion to left chin per EMS.  No deformities noted.  Pt denies neck or back pain.  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  Pt reports similar episode 3 weeks ago and was dx with UTI, reports completing Keflex.  Pt a x 4, NAD. BP-106/44 P-56 CBG-106, EKG unremarkable.

## 2015-01-24 NOTE — Discharge Instructions (Signed)
Contusion °A contusion is a deep bruise. Contusions are the result of an injury that caused bleeding under the skin. The contusion may turn blue, purple, or yellow. Minor injuries will give you a painless contusion, but more severe contusions may stay painful and swollen for a few weeks.  °CAUSES  °A contusion is usually caused by a blow, trauma, or direct force to an area of the body. °SYMPTOMS  °· Swelling and redness of the injured area. °· Bruising of the injured area. °· Tenderness and soreness of the injured area. °· Pain. °DIAGNOSIS  °The diagnosis can be made by taking a history and physical exam. An X-ray, CT scan, or MRI may be needed to determine if there were any associated injuries, such as fractures. °TREATMENT  °Specific treatment will depend on what area of the body was injured. In general, the best treatment for a contusion is resting, icing, elevating, and applying cold compresses to the injured area. Over-the-counter medicines may also be recommended for pain control. Ask your caregiver what the best treatment is for your contusion. °HOME CARE INSTRUCTIONS  °· Put ice on the injured area. °¨ Put ice in a plastic bag. °¨ Place a towel between your skin and the bag. °¨ Leave the ice on for 15-20 minutes, 3-4 times a day, or as directed by your health care provider. °· Only take over-the-counter or prescription medicines for pain, discomfort, or fever as directed by your caregiver. Your caregiver may recommend avoiding anti-inflammatory medicines (aspirin, ibuprofen, and naproxen) for 48 hours because these medicines may increase bruising. °· Rest the injured area. °· If possible, elevate the injured area to reduce swelling. °SEEK IMMEDIATE MEDICAL CARE IF:  °· You have increased bruising or swelling. °· You have pain that is getting worse. °· Your swelling or pain is not relieved with medicines. °MAKE SURE YOU:  °· Understand these instructions. °· Will watch your condition. °· Will get help right  away if you are not doing well or get worse. °Document Released: 04/22/2005 Document Revised: 07/18/2013 Document Reviewed: 05/18/2011 °ExitCare® Patient Information ©2015 [employer]. This information is not intended to replace advice given to you by your health care provider. Make sure you discuss any questions you have with your health care provider. ° °

## 2015-01-25 LAB — URINE CULTURE

## 2015-02-05 ENCOUNTER — Ambulatory Visit (INDEPENDENT_AMBULATORY_CARE_PROVIDER_SITE_OTHER): Payer: 59 | Admitting: *Deleted

## 2015-02-05 DIAGNOSIS — I63412 Cerebral infarction due to embolism of left middle cerebral artery: Secondary | ICD-10-CM | POA: Diagnosis not present

## 2015-02-06 ENCOUNTER — Encounter: Payer: Self-pay | Admitting: Cardiology

## 2015-02-13 ENCOUNTER — Encounter: Payer: Self-pay | Admitting: Cardiology

## 2015-03-01 ENCOUNTER — Encounter: Payer: Self-pay | Admitting: Cardiology

## 2015-03-05 LAB — CUP PACEART REMOTE DEVICE CHECK
MDC IDC SESS DTM: 20160630000939
MDC IDC SET ZONE DETECTION INTERVAL: 3000 ms
Zone Setting Detection Interval: 2000 ms
Zone Setting Detection Interval: 360 ms

## 2015-03-05 NOTE — Progress Notes (Signed)
Loop recorder 

## 2015-03-06 ENCOUNTER — Encounter: Payer: Self-pay | Admitting: Cardiology

## 2015-03-13 ENCOUNTER — Encounter: Payer: Self-pay | Admitting: Cardiology

## 2015-03-21 ENCOUNTER — Encounter: Payer: Self-pay | Admitting: Cardiology

## 2015-03-28 ENCOUNTER — Encounter: Payer: Self-pay | Admitting: Cardiology

## 2015-04-05 ENCOUNTER — Encounter: Payer: Self-pay | Admitting: Cardiology

## 2015-04-08 ENCOUNTER — Encounter: Payer: Self-pay | Admitting: Cardiovascular Disease

## 2015-04-11 ENCOUNTER — Encounter: Payer: Self-pay | Admitting: Cardiology

## 2015-04-17 ENCOUNTER — Encounter: Payer: Self-pay | Admitting: Cardiology

## 2015-07-08 ENCOUNTER — Ambulatory Visit: Payer: BLUE CROSS/BLUE SHIELD | Admitting: Neurology

## 2015-07-09 ENCOUNTER — Encounter: Payer: Self-pay | Admitting: Neurology

## 2015-09-05 ENCOUNTER — Encounter: Payer: Self-pay | Admitting: Cardiovascular Disease

## 2015-09-05 ENCOUNTER — Ambulatory Visit (INDEPENDENT_AMBULATORY_CARE_PROVIDER_SITE_OTHER): Payer: 59 | Admitting: Cardiovascular Disease

## 2015-09-05 VITALS — BP 140/64 | HR 54 | Ht 65.0 in | Wt 207.7 lb

## 2015-09-05 DIAGNOSIS — I6930 Unspecified sequelae of cerebral infarction: Secondary | ICD-10-CM

## 2015-09-05 DIAGNOSIS — Z79899 Other long term (current) drug therapy: Secondary | ICD-10-CM

## 2015-09-05 DIAGNOSIS — Z9989 Dependence on other enabling machines and devices: Secondary | ICD-10-CM

## 2015-09-05 DIAGNOSIS — E785 Hyperlipidemia, unspecified: Secondary | ICD-10-CM

## 2015-09-05 DIAGNOSIS — Z4509 Encounter for adjustment and management of other cardiac device: Secondary | ICD-10-CM

## 2015-09-05 DIAGNOSIS — G4733 Obstructive sleep apnea (adult) (pediatric): Secondary | ICD-10-CM

## 2015-09-05 DIAGNOSIS — I10 Essential (primary) hypertension: Secondary | ICD-10-CM

## 2015-09-05 NOTE — Progress Notes (Signed)
Patient ID: Abigail Johns, female   DOB: 05/25/1951, 65 y.o.   MRN: [id number]    Cardiology Office Note    Date:  09/07/2015   ID:  Abigail Johns, DOB [birth year], MRN [id number]  PCP:  Tivis Ringer, MD  Cardiologist:   Sanda Klein, MD   Chief Complaint  Patient presents with  . Annual Exam    pacer check// pt states no Sx.    History of Present Illness:  Abigail Johns is a 65 y.o. female with a history of stroke and residual aphasia who presents for follow-up. She has systemic hypertension and hyperlipidemia as well as type 2 diabetes mellitus. She has hyperlipidemia but is intolerant to statins, taking a combination of Zetia and WelChol. In the past she had diastolic heart failure and is taking as needed loop diuretic. He had a normal TEE and  her loop recorder has not demonstrated evidence of atrial fibrillation since implantation roughly 16 months ago. She requires occasional furosemide for lower extremity edema. She is not taking it on a daily basis. Her problems with lower extremity edema, dyspnea, chest pain, syncope, palpitations or new focal neurological deficits since her last appointment. She continues to receive speech therapy and is making some progress with aphasia.  Her mother, Abigail Johns who was also my patient, passed away roughly one month ago.  This is her first office visit since Loop recorder implantation and her first loop recorder check since a download in July 2016  Past Medical History  Diagnosis Date  . CHF (congestive heart failure) ([place])   . Hypertension   . High cholesterol   . Thyroid disease   . Diabetes mellitus   . Back pain, chronic   . Renal disorder   . Obesity   . OSA on CPAP   . Edema     Past Surgical History  Procedure Laterality Date  . Nephrolithotomy      Right  . Abdominal hysterectomy    . Eye surgery    . Bunionectomy      both feet  . Left knee surgery      Torn meniscus  . US echocardiography   12/24/2011    mild LVH,mild TR,trace MR,PI  . Laparoscopic ovarian cystectomy Right 11/01/2013    Procedure: LAPAROSCOPIC RIGHT OVARIAN CYSTECTOMY WITH COLLECTION OF WASHINGS ;  Surgeon: Ena Dawley, MD;  Location: [facility];  Service: Gynecology;  Laterality: Right;  . Laparoscopic lysis of adhesions N/A 11/01/2013    Procedure: LAPAROSCOPIC LYSIS OF ADHESIONS;  Surgeon: Ena Dawley, MD;  Location: [facility];  Service: Gynecology;  Laterality: N/A;  . Laparoscopy N/A 11/01/2013    Procedure: LAPAROSCOPY DIAGNOSTIC;  Surgeon: Ena Dawley, MD;  Location: [facility];  Service: Gynecology;  Laterality: N/A;  . Tee without cardioversion N/A 05/11/2014    Procedure: TRANSESOPHAGEAL ECHOCARDIOGRAM (TEE);  Surgeon: Sanda Klein, MD;  Location: [place] [facility];  Service: Cardiovascular;  Laterality: N/A;  . Loop recorder implant N/A 05/11/2014    Procedure: LOOP RECORDER IMPLANT;  Surgeon: Coralyn Mark, MD;  Location: [facility];  Service: Cardiovascular;  Laterality: N/A;    Outpatient Prescriptions Prior to Visit  Medication Sig Dispense Refill  . allopurinol (ZYLOPRIM) 100 MG tablet Take 100 mg by mouth daily.   3  . buPROPion (WELLBUTRIN) 100 MG tablet Take 100 mg by mouth 3 (three) times daily.    . clopidogrel (PLAVIX) 75 MG tablet Take 1 tablet (75 mg total) by mouth daily. 90 tablet 3  . cycloSPORINE (RESTASIS)  0.05 % ophthalmic emulsion Place 1 drop into both eyes 2 (two) times daily.    Marland Kitchen escitalopram (LEXAPRO) 5 MG tablet Take 5 mg by mouth daily.     Marland Kitchen ezetimibe (ZETIA) 10 MG tablet Take 10 mg by mouth daily.    . ferrous sulfate 325 (65 FE) MG tablet Take 325 mg by mouth daily with breakfast.    . furosemide (LASIX) 40 MG tablet Take 40 mg by mouth daily as needed.    . gabapentin (NEURONTIN) 600 MG tablet Take 600 mg by mouth at bedtime.    Marland Kitchen glyBURIDE (DIABETA) 5 MG tablet Take 5 mg by mouth daily with supper.    . levothyroxine (SYNTHROID, LEVOTHROID) 75 MCG tablet Take 75 mcg by mouth  daily.    . meloxicam (MOBIC) 15 MG tablet Take 15 mg by mouth daily.    . metFORMIN (GLUCOPHAGE) 500 MG tablet Take 500 mg by mouth 2 (two) times daily with a meal.    . methylphenidate (CONCERTA) 36 MG CR tablet Take 36 mg by mouth every morning.    . mupirocin cream (BACTROBAN) 2 % Apply topically 2 (two) times daily. (Patient taking differently: Apply 1 application topically 2 (two) times daily as needed (redness/irritation). ) 15 g 0  . nebivolol (BYSTOLIC) 5 MG tablet Take 5 mg by mouth daily.    Abigail Johns VERIO test strip   6  . pantoprazole (PROTONIX) 40 MG tablet Take 40 mg by mouth daily.    . temazepam (RESTORIL) 30 MG capsule Take 30 mg by mouth at bedtime as needed for sleep.    . Vitamin D, Ergocalciferol, (DRISDOL) 50000 UNITS CAPS Take 50,000 Units by mouth 2 (two) times a week. Mondays and fridays    . WELCHOL 3.75 G PACK Take 3.75 g by mouth daily.     . potassium chloride SA (K-DUR,KLOR-CON) 20 MEQ tablet Take 40 mEq by mouth daily.     Marland Kitchen spironolactone (ALDACTONE) 50 MG tablet Take 50 mg by mouth daily.    . Canagliflozin (INVOKANA) 300 MG TABS Take 300 mg by mouth daily.     . cephALEXin (KEFLEX) 500 MG capsule Take 1 capsule (500 mg total) by mouth 4 (four) times daily. (Patient not taking: Reported on 01/23/2015) 28 capsule 0  . lidocaine (LIDODERM) 5 % Place 1 patch onto the skin as needed (pain). Remove & Discard patch within 12 hours or as directed by MD     No facility-administered medications prior to visit.     Allergies:   Lipitor and Crestor   Social History   Social History  . Marital Status: Divorced    Spouse Name: N/A  . Number of Children: 2  . Years of Education: MA   Social History Main Topics  . Smoking status: Former Smoker    Quit date: 07/26/2002  . Smokeless tobacco: Never Used  . Alcohol Use: No  . Drug Use: No  . Sexual Activity: Not Asked   Other Topics Concern  . None   Social History Narrative   Patient is right handed.    Patient does not drink caffeine.     Family History:  The patient's family history includes Cancer in her father; Diabetes in her father and mother; Heart attack in her brother; Hyperlipidemia in her brother and mother; Hypertension in her brother, father, and mother.   ROS:   Please see the history of present illness.    ROS All other systems reviewed and are negative.  PHYSICAL EXAM:   VS:  BP 140/64 mmHg  Pulse 54  Ht 5\' 5"  (1.651 m)  Wt 94.212 kg (207 lb 11.2 oz)  BMI 34.56 kg/m2   GEN: Well nourished, well developed, in no acute distress HEENT: normal Neck: no JVD, carotid bruits, or masses Cardiac: RRR; no murmurs, rubs, or gallops,no edema  Respiratory:  clear to auscultation bilaterally, normal work of breathing GI: soft, nontender, nondistended, + BS MS: no deformity or atrophy Skin: warm and dry, no rash Neuro:  Moderate to severe expressive aphasia, Alert and Oriented x 3, Strength and sensation are intact Psych: euthymic mood, full affect  Wt Readings from Last 3 Encounters:  09/05/15 94.212 kg (207 lb 11.2 oz)  01/23/15 89.359 kg (197 lb)  01/02/15 89.359 kg (197 lb)      Studies/Labs Reviewed:   EKG:  EKG is ordered today.  The ekg ordered today demonstrates  mild sinus bradycardia, otherwise normal  Recent Labs: 01/02/2015: B Natriuretic Peptide 16.0 01/23/2015: BUN 23*; Creatinine, Ser 1.20*; Hemoglobin 19.4*; Platelets 314; Potassium 3.8; Sodium 138   Lipid Panel    Component Value Date/Time   CHOL 200 05/09/2014 0640   TRIG 198* 05/09/2014 0640   HDL 40 05/09/2014 0640   CHOLHDL 5.0 05/09/2014 0640   VLDL 40 05/09/2014 0640   LDLCALC 120* 05/09/2014 0640    ASSESSMENT:    1. Essential hypertension   2. Hyperlipidemia   3. Medication management   4. OSA on CPAP   5. History of CVA with residual deficit   6. Morbid obesity due to excess calories ([place])   7. Encounter for loop recorder check      PLAN:  In order of problems listed  above: 1. HTN: Fair blood pressure control 2. HLP: intolerance to atorvastatin and rosuvastatin, need to recheck her lipid profile on current treatment with Zetia and WelChol (she thinks her memory care physician Dr. Dagmar Hait plans to check this next month) 3. No changes made in medications at this visit. Clarified that she can take furosemide on "as needed" basis and she does need to take potassium supplements when she takes furosemide only 4. OSA: Reports compliance with CPAP 100% 5. CVA/aphasia: On chronic clopidogrel therapy. Some improvement with expressive aphasia, still has difficulty communicating 6. Obesity: Her weight has increased. There is no clinical exam evidence of hypervolemia. Encouraged to try to lose the extra weight 7. ILR: Normal loop recorder function. No evidence of atrial fibrillation in 16 months since device implantation. Stroke remains largely unexplained.   Medication Adjustments/Labs and Tests Ordered: Current medicines are reviewed at length with the patient today.  Concerns regarding medicines are outlined above.  Medication changes, Labs and Tests ordered today are listed in the Patient Instructions below. Patient Instructions  Your physician recommends that you return for lab work in: [place] physician has recommended you make the following change in your medication: TAKE THE FUROSEMIDE AS NEEDED ONLY FOR SWELLING  Dr. Sallyanne Kuster recommends that you schedule a follow-up appointment in: ONE YEAR      SignedSanda Klein, MD  09/07/2015 6:34 PM    [facility] [address], [place], [place]  [address] Phone: [phone]; Fax: [phone]

## 2015-09-05 NOTE — Patient Instructions (Signed)
Your physician recommends that you return for lab work in: [place] has recommended you make the following change in your medication: TAKE THE FUROSEMIDE AS NEEDED ONLY FOR SWELLING  Dr. Sallyanne Kuster recommends that you schedule a follow-up appointment in: [place]

## 2015-09-07 DIAGNOSIS — Z4509 Encounter for adjustment and management of other cardiac device: Secondary | ICD-10-CM | POA: Insufficient documentation

## 2015-09-20 ENCOUNTER — Encounter: Payer: Self-pay | Admitting: Cardiology

## 2015-09-27 ENCOUNTER — Encounter: Payer: Self-pay | Admitting: Cardiology

## 2015-10-03 ENCOUNTER — Ambulatory Visit (INDEPENDENT_AMBULATORY_CARE_PROVIDER_SITE_OTHER): Payer: Self-pay | Admitting: *Deleted

## 2015-10-03 DIAGNOSIS — I63412 Cerebral infarction due to embolism of left middle cerebral artery: Secondary | ICD-10-CM

## 2015-10-07 NOTE — Progress Notes (Signed)
[facility] Summary Report / Loop Recorder 

## 2015-10-11 ENCOUNTER — Telehealth: Payer: Self-pay | Admitting: Cardiology

## 2015-10-11 NOTE — Telephone Encounter (Signed)
LMOVM for pt to return call in regards to home monitor.

## 2015-10-22 ENCOUNTER — Encounter: Payer: Self-pay | Admitting: Cardiology

## 2015-10-30 ENCOUNTER — Ambulatory Visit (INDEPENDENT_AMBULATORY_CARE_PROVIDER_SITE_OTHER): Payer: Self-pay | Admitting: Neurology

## 2015-10-30 ENCOUNTER — Encounter: Payer: Self-pay | Admitting: Neurology

## 2015-10-30 VITALS — BP 124/70 | HR 66 | Ht 65.0 in | Wt 213.2 lb

## 2015-10-30 DIAGNOSIS — E1159 Type 2 diabetes mellitus with other circulatory complications: Secondary | ICD-10-CM

## 2015-10-30 DIAGNOSIS — I63412 Cerebral infarction due to embolism of left middle cerebral artery: Secondary | ICD-10-CM | POA: Insufficient documentation

## 2015-10-30 DIAGNOSIS — Z9989 Dependence on other enabling machines and devices: Secondary | ICD-10-CM

## 2015-10-30 DIAGNOSIS — G4733 Obstructive sleep apnea (adult) (pediatric): Secondary | ICD-10-CM

## 2015-10-30 DIAGNOSIS — E7800 Pure hypercholesterolemia, unspecified: Secondary | ICD-10-CM

## 2015-10-30 DIAGNOSIS — I10 Essential (primary) hypertension: Secondary | ICD-10-CM

## 2015-10-30 NOTE — Progress Notes (Signed)
STROKE NEUROLOGY FOLLOW UP NOTE  NAME: Abigail Johns DOB: [birth year]  REASON FOR VISIT: stroke follow up HISTORY FROM: chart and and pt and daughter  Today we had the pleasure of seeing Abigail Johns in follow-up at our Neurology Clinic. Pt was accompanied by daughter.   History Summary Abigail Johns is a 65 y.o. female PMH of HTN, DM, CHF, HLD intolerate to statin, thyroid disease, right glass eye, OSA, and recent stroke in 9/15 at [facility] in [place], [place] that left her with residual right side hemiparesis was admitted on 05/08/14 for unresponsiveness. There was no mentioning of any urinary or stool incontinence or shaking. EMS was then called by the pt's daughter where she continued to be non responsive, with right sided hemiparesis, and some rapid eye movement. No frank seizure like activity seen. Concerning for seizure episodes, EEG showed no seizure but left frontotemporal cerebral dysfunction consistent with previous stroke. She was put on keppra. MRI showed left temporal subacute stroke but also acute left coronal radiata and BG subcortical stroke. MRA showed left M1 severe stenosis. Her stroke considered emboli pattern but coronal radiata and BG stroke could also be due to hypotension in the setting of M1 high grade stenosis. TEE was done no cardiac source of emboli. Loop recorder was placed and pt sent to inpt rehab with full dose ASA as well as zetia.  07/12/14 follow up - the patient has been doing better. However, still weak on the left with sensory loss, with partial aphasia. Her BP in good control and today 127/73 in clinic. Pt stated that her glucose at home also good although did not give me numbers. Loop recording so far no afib episodes. Her therapies were stopped for some time due to insurance switch. Now she has coverage, will need to restart the therapy.  09/18/14 follow up - the patient was doing slightly better. She started to have PT OT  rehabilitation last week, she is going to do twice week. SBP around 120 at home as per daughter. However this morning is 101/65 in clinic. As per daughter her diabetes controlled pretty good, currently off insulin. On glipizide and metformin. However, patient stated that she has alopecia after taking Keppra. She has no seizure episode reported. Loop recorder did not show atrial fibrillation episodes so far.  12/27/14 follow up - she has been doing well. Completed PT/OT/speech sessions and made progress on right hemiparesis, however, the partial aphsia still remains. She still has difficulty with writing and not able to fully understand conversations. She is going to have speech therapy again in 2 months. So far loop recorder showed no afib episodes. BP at home 120s, today 113/75. She stated that her glucose is under control, today 103.  Interval History During the interval time, she has been doing the same. Has followed up with Dr. Sallyanne Kuster and loop recorder did not show afib since implantation. Compliance with CPAP for OSA. Still has partial expressive aphasia. Able to walk with cane. Requesting disability form to be filled out. Stated BP and glucose in good control at home. Today BP 124/70. Only on bystolic 5mg  and lasix PRN at home.  REVIEW OF SYSTEMS: Full 14 system review of systems performed and notable only for those listed below and in HPI above, all others are negative:  Constitutional: N/A  Cardiovascular: N/A  Ear/Nose/Throat: N/A  Skin: N/A  Eyes: N/A  Respiratory:  Gastroitestinal:  Genitourinary: N/A Hematology/Lymphatic: N/A  Endocrine: N/A  Musculoskeletal: N/A  Allergy/Immunology: N/A  Neurological: N/A  Psychiatric: N/A  The following represents the patient's updated allergies and side effects list: Allergies  Allergen Reactions  . Lipitor [Atorvastatin Calcium] Other (See Comments)    Myocitis  . Crestor [Rosuvastatin]     myalgias    The neurologically relevant items  on the patient's problem list were reviewed on today's visit.  Neurologic Examination  A problem focused neurological exam (12 or more points of the single system neurologic examination, vital signs counts as 1 point, cranial nerves count for 8 points) was performed.  Blood pressure 124/70, pulse 66, height 5\' 5"  (1.651 m), weight 213 lb 3.2 oz (96.707 kg).  General - Well nourished, well developed, in no apparent distress.  Ophthalmologic - right eye glass, left eye not able to see through.  Cardiovascular - Regular rate and rhythm with no murmur.  Mental Status -  Level of arousal and orientation to time, place, and person were intact. Partial expressive aphasia, able to follow 2-step commands, name objects 4/4, difficulty with repeating complex sentences, mild dysarthria.   Cranial Nerves II - XII - II - Visual field intact OU. III, IV, VI - Extraocular movements intact. V - Facial sensation decreased on the right. VII - mild right facial droop. VIII - Hearing & vestibular intact bilaterally. X - Palate elevates symmetrically, mild dysarthria. XI - Chin turning & shoulder shrug intact bilaterally. XII - Tongue protrusion intact.  Motor Strength - The patient's strength 5-/5 RUE proximal and mild dexterity difficulty at right hand, RLE 5-/5 proximal and distal. LUE and LLE 5/5.  Bulk was normal and fasciculations were absent.   Motor Tone - Muscle tone was assessed at the neck and appendages and was normal.  Reflexes - The patient's reflexes were 1+ in all extremities and she had no pathological reflexes.  Sensory - Light touch, temperature/pinprick were decreased on the right.    Coordination - The patient had normal movements in the hands and feet with no ataxia or dysmetria.  Tremor was absent.  Gait and Station - walk with cane, right mild hemiparetic gait, slow with small stride.  Data reviewed: I personally reviewed the images and agree with the radiology  interpretations.  Ct Abdomen Pelvis Wo Contrast 05/09/2014 1. No acute intra-abdominal findings. 2. Nonobstructive bilateral nephrolithiasis. 3. Hepatic steatosis.   Dg Chest 2 View 05/09/2014 Low lung volumes with bibasilar atelectasis.   Ct Head Wo Contrast 05/09/2014 1. Grossly unchanged ill-defined left-sided periventricular hypodensities compatible with the known acute infarct demonstrated on brain MRI performed day prior. No CT evidence of hemorrhagic conversion or new acute large territory infarct. 2. Increased attenuation of multiple sulci within the left temporal lobe compatible with mineralization as demonstrated on prior brain MRI.  05/08/2014 There is hemorrhage in the parenchyma of the lateral left temporal lobe. This appearance raises question of amyloid angiography. There is a questionable acute small infarct in the anterior limb of the left internal capsule. There is atrophy with patchy periventricular small vessel disease. There is no subdural or epidural fluid. 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  It should be noted that the patient by report had a CVA approximately 1 month ago. It is possible that the apparent hemorrhage in the left temporal region actually represents calcification as a reactive response to prior infarct. It is possible that there is both calcification and hemorrhage in this area. Given this circumstance, MR could be most helpful for further assessment with respect to these differential considerations.  Mr Brain Wo Contrast 05/08/2014 Moderately extensive acute LEFT subcortical white matter infarction. Abnormal LEFT temporal lobe on CT consistent with mineralization of a subacute insult. No parenchymal hemorrhage is evident.   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   2D Echocardiogram No cardiac source of emboli was indentified. EF 60-65%    Carotid Doppler No evidence of hemodynamically significant internal carotid artery stenosis. Vertebral artery flow is antegrade.   EEG - Clinical Interpretation: This EEG demonstrates evidence of a left frontotemporal cerebral dysfunction that is etiologically nonspecific. There was no seizure or seizure predisposition recorded on this study.   TEE 05/11/2014 Normal TEE. No cardiac source of embolism.  Loop recording - no afib so far  Component     Latest Ref Rng 05/09/2014  Cholesterol     0 - 200 mg/dL 200  Triglycerides     <150 mg/dL 198 (H)  HDL     >39 mg/dL 40  Total CHOL/HDL Ratio      5.0  VLDL     0 - 40 mg/dL 40  LDL (calc)     0 - 99 mg/dL 120 (H)  Hgb A1c MFr Bld     <5.7 % 7.3 (H)  Mean Plasma Glucose     <117 mg/dL 163 (H)  TSH     0.350 - 4.500 uIU/mL 3.160    Assessment: As you may recall, she is a 65 y.o. African American female with PMH of HTN, DM, CHF, HLD intolerate to statin, thyroid disease, right glass eye, OSA, and recent stroke in 9/15 at [facility] in [place], [place] that left her with residual right side hemiparesis was admitted on 05/08/14 for unresponsiveness. Concerning for seizure episodes, EEG showed no seizure but left frontotemporal cerebral dysfunction consistent with previous stroke. She was put on keppra. MRI showed left temporal subacute stroke but also acute left coronal radiata and BG subcortical stroke. MRA showed left M1 severe stenosis. Her stroke considered emboli pattern but coronal radiata and BG stroke could also be due to hypotension in the setting of M1 high grade stenosis. TEE was done no cardiac source of emboli. Loop recorder was placed and so far no Afib episodes. Her right hemiparesis improved but still has partial expressive aphasia. Has not checked lipid profile yet. Follows with Dr. Sallyanne Kuster in cardiology. Continue home speech exercise  Plan:  - continue Plavix for stroke prevention - zetia and welchol  for HLD due to intolerance of statins. Please check cholesterol with Dr. Dagmar Hait and consider PCSK9 inhibitors such as praluent or Repatha. You may need prior authorization for those.  - due to left M1 high grade stenosis, will keep BP at 120-150. Avoid hypotension. - check BP and glucose at home - Follow up with your primary care physician for stroke risk factor modification. Recommend maintain blood pressure goal 120-150, diabetes with hemoglobin A1c goal below 6.5% and lipids with LDL cholesterol goal below 70 mg/dL.  - follow up with Dr. Sallyanne Kuster - continue home speech exercise.  - follow up in one year  I spent more than 25 minutes of face to face time with the patient. Greater than 50% of time was spent in counseling and coordination of care.    No orders of the defined types were placed in this encounter.    No orders of the defined types were placed in this encounter.    Patient Instructions  -  continue Plavix for stroke prevention - zetia and welchol for HLD due to intolerance of statins. Please check cholesterol with Dr. Dagmar Hait and consider PCSK9 inhibitors such as praluent or Repatha. You may need prior authorization for those.  - due to left M1 high grade stenosis, will keep BP at 120-150. Avoid hypotension. - check BP and glucose at home - Follow up with your primary care physician for stroke risk factor modification. Recommend maintain blood pressure goal 120-150, diabetes with hemoglobin A1c goal below 6.5% and lipids with LDL cholesterol goal below 70 mg/dL.  - follow up with Dr. Sallyanne Kuster - continue home speech exercise. Let us know whenever your insurance covers for new speech therapy - follow up in one year    Rosalin Hawking, MD PhD [facility] [address], [place], [place] [address] [phone]

## 2015-10-30 NOTE — Patient Instructions (Addendum)
-   continue Plavix for stroke prevention - zetia and welchol for HLD due to intolerance of statins. Please check cholesterol with Dr. Dagmar Hait and consider PCSK9 inhibitors such as praluent or Repatha. You may need prior authorization for those.  - due to left M1 high grade stenosis, will keep BP at 120-150. Avoid hypotension. - check BP and glucose at home - Follow up with your primary care physician for stroke risk factor modification. Recommend maintain blood pressure goal 120-150, diabetes with hemoglobin A1c goal below 6.5% and lipids with LDL cholesterol goal below 70 mg/dL.  - follow up with Dr. Sallyanne Kuster - continue home speech exercise. Let us know whenever your insurance covers for new speech therapy - follow up in one year

## 2015-10-31 ENCOUNTER — Telehealth: Payer: Self-pay

## 2015-10-31 NOTE — Telephone Encounter (Signed)
Rn cal patients daughter to let her know the [employer] disability forms was ready for pick up. Pts daughter stated her husband will pick them up.

## 2015-11-04 ENCOUNTER — Encounter: Payer: Self-pay | Admitting: *Deleted

## 2015-11-05 ENCOUNTER — Encounter: Payer: Self-pay | Admitting: Cardiology

## 2015-11-11 ENCOUNTER — Encounter: Payer: Self-pay | Admitting: Cardiology

## 2015-11-14 ENCOUNTER — Ambulatory Visit: Payer: BLUE CROSS/BLUE SHIELD | Admitting: Neurology

## 2015-12-17 LAB — CUP PACEART REMOTE DEVICE CHECK: Date Time Interrogation Session: 20170310010823

## 2016-02-07 ENCOUNTER — Telehealth: Payer: Self-pay | Admitting: Cardiology

## 2016-02-07 NOTE — Telephone Encounter (Signed)
Attempted to call pt b/c her home monitor has not updated in at least 14 days. No answer and unable to leave a message.  

## 2016-02-13 ENCOUNTER — Encounter: Payer: Self-pay | Admitting: Cardiology

## 2016-02-26 ENCOUNTER — Encounter: Payer: Self-pay | Admitting: Cardiology

## 2016-03-09 IMAGING — CT CT HEAD W/O CM
1 series · 15 of 30 positions shown, 19 images · non-contrast
Comparison: 05/08/2014; brain MRI - 05/08/2014

CLINICAL DATA: CVA.  Subsequent encounter

EXAM:
CT HEAD WITHOUT CONTRAST
TECHNIQUE: Contiguous axial images were obtained from the base of the skull
through the vertex without intravenous contrast.

[Series 2: head 5.0 h30s · axial · 0.47mm/px · z∈[-106,+29]mm · 15 of 31 slices shown, 19 images]
[im 2/31  brain]
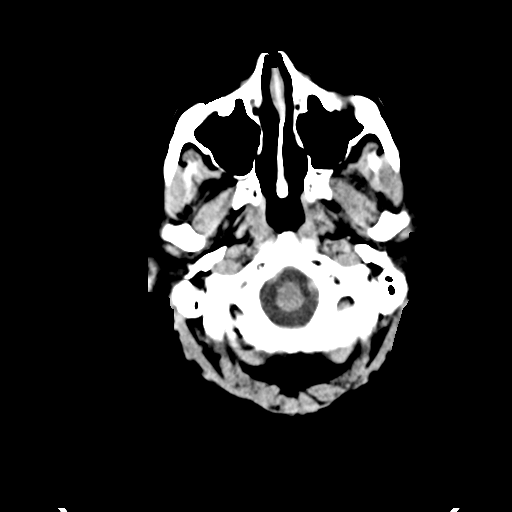
[im 2/31  bone]
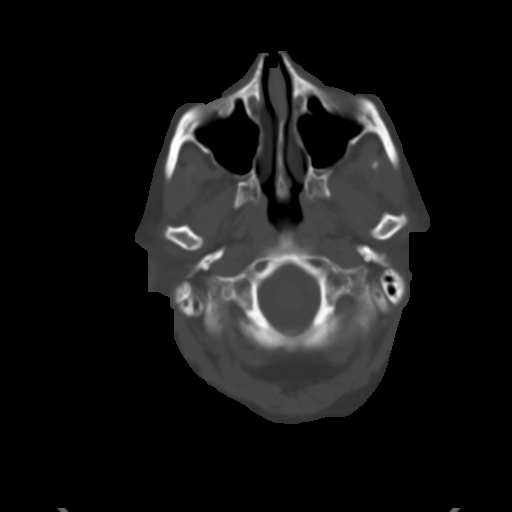
[im 4/31  brain]
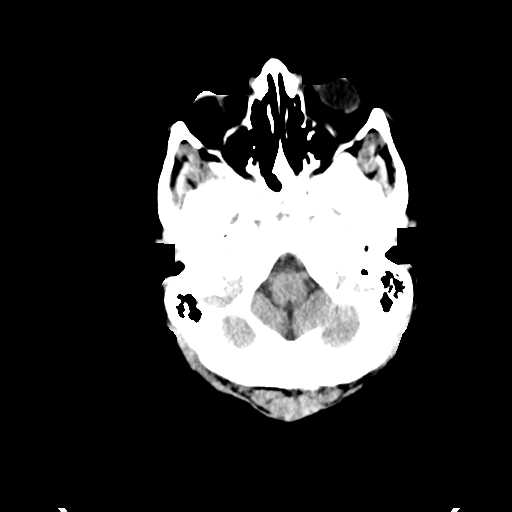
[im 6/31  brain]
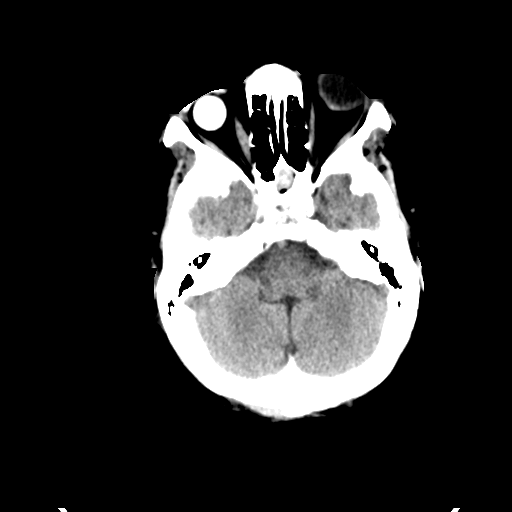
[im 8/31  brain]
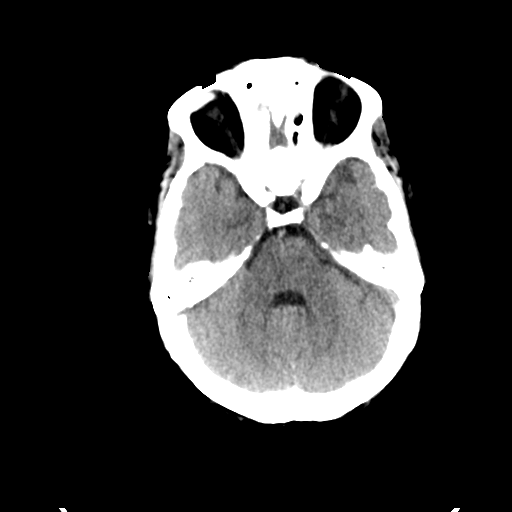
[im 10/31  brain]
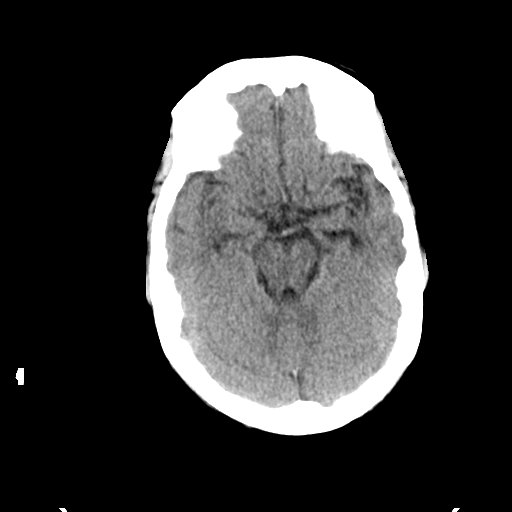
[im 10/31  bone]
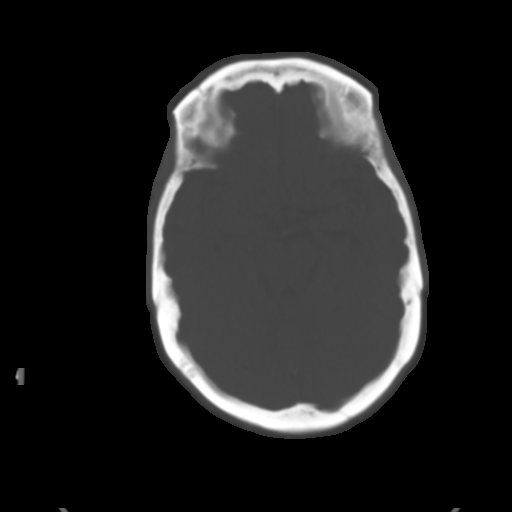
[im 12/31  brain]
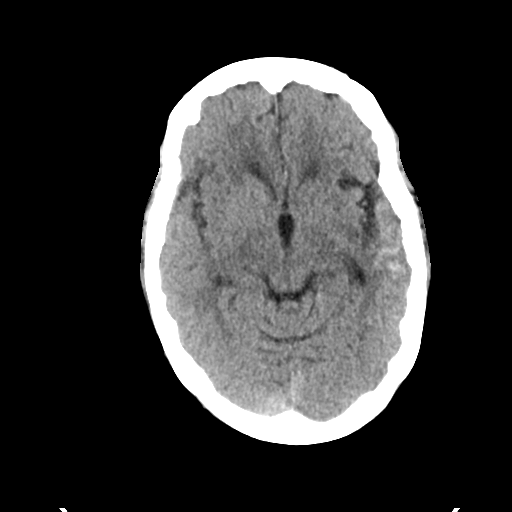
[im 14/31  brain]
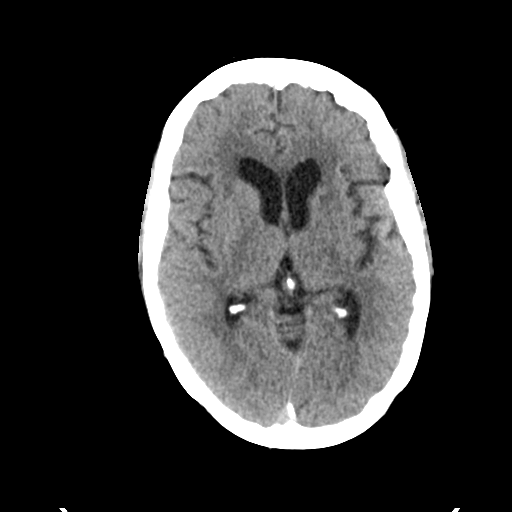
[im 16/31  brain]
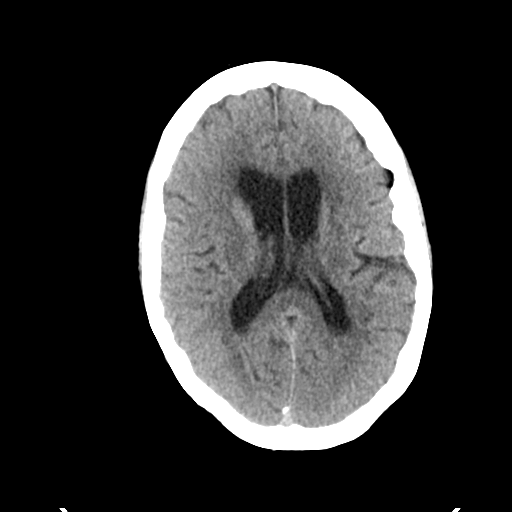
[im 17/31  brain]
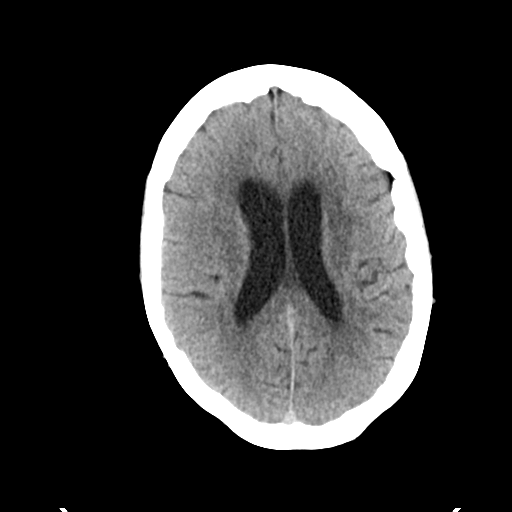
[im 17/31  bone]
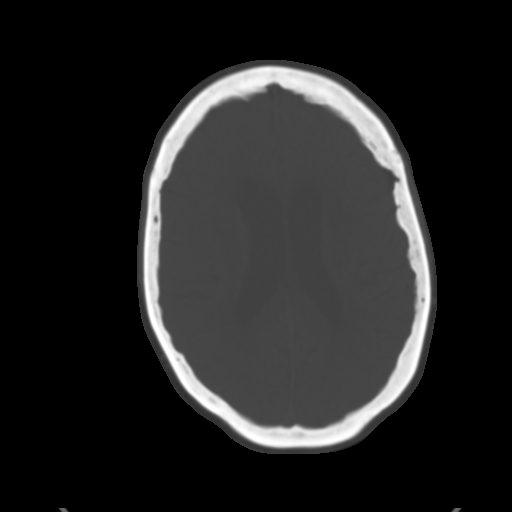
[im 19/31  brain]
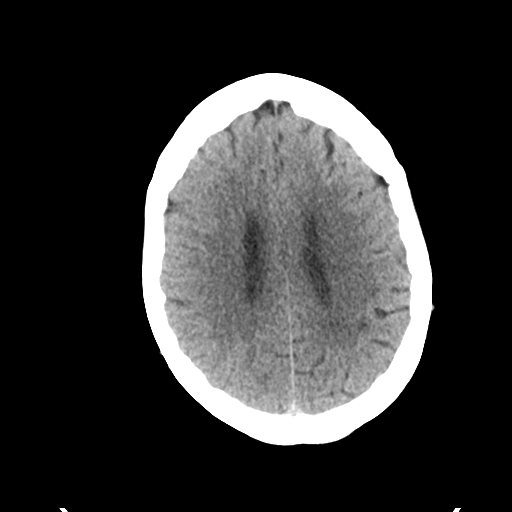
[im 21/31  brain]
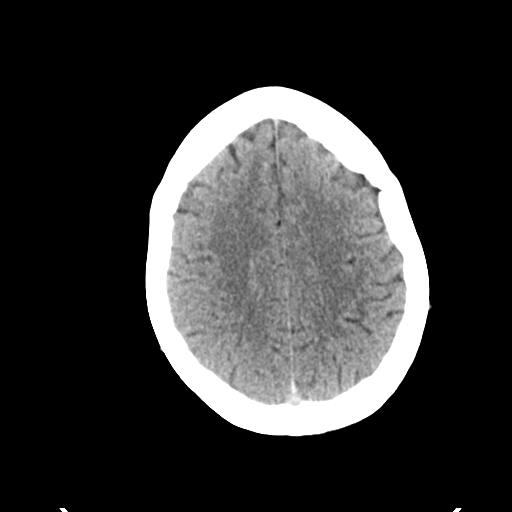
[im 23/31  brain]
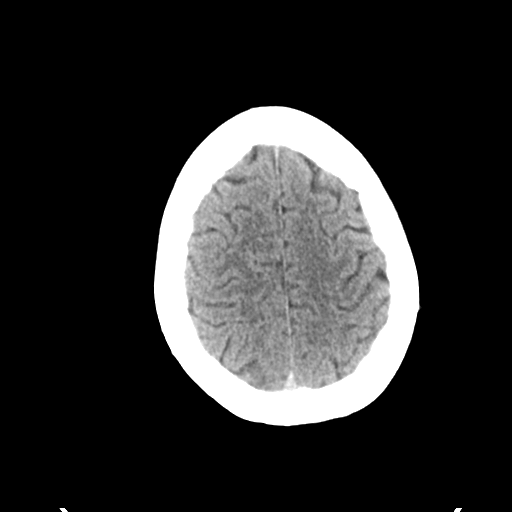
[im 25/31  brain]
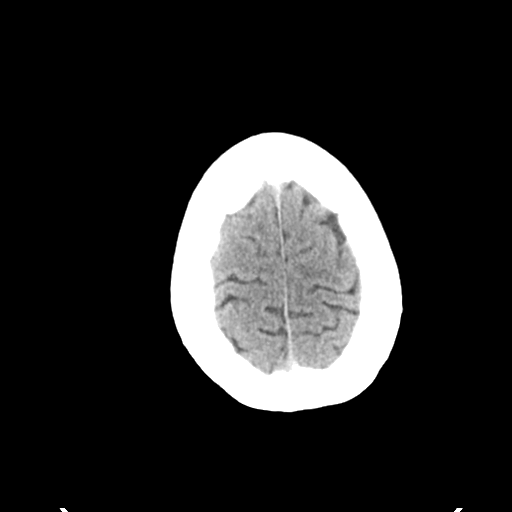
[im 25/31  bone]
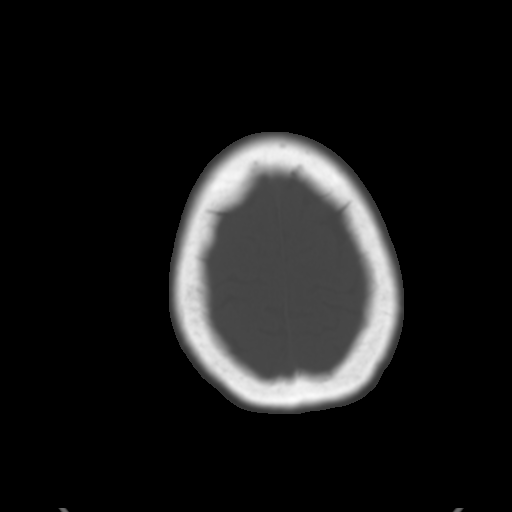
[im 27/31  brain]
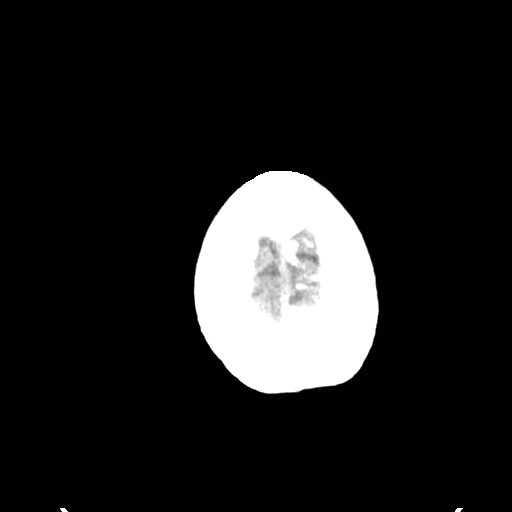
[im 29/31  brain]
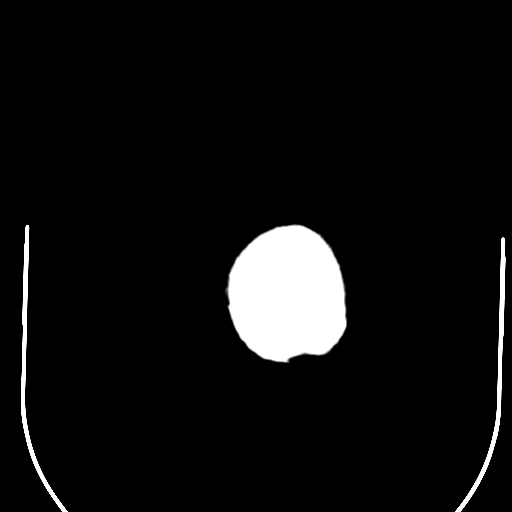

[15 of 30 positions shown; findings below may reference images not displayed]

FINDINGS: Unchanged increase attenuation of multiple sulci within the anterior
aspect of the left temporal lobe (images 11 and 13, series 2) and
compatible with mineralization as was demonstrated on brain MRI
performed day prior.

Scattered left-sided periventricular hypodensities are unchanged
compatible with the area of involving subcortical white matter
infarction demonstrated on prior MRI.

No CT evidence of new acute large territory infarct. No
intraparenchymal or extra-axial mass or hemorrhage. Unchanged size
and configuration of the ventricles and basilar cisterns. No midline
shift. Intracranial atherosclerosis. Limited visualization the
paranasal sinuses and mastoid air cells are normal. No air-fluid
levels. Regional soft tissues are normal. No displaced calvarial
fracture. Note is again made of a right globe and eye lid
prosthesis.
IMPRESSION: 1. Grossly unchanged ill-defined left-sided periventricular
hypodensities compatible with the known acute infarct demonstrated
on brain MRI performed day prior. No CT evidence of hemorrhagic
conversion or new acute large territory infarct.
2. Increased attenuation of multiple sulci within the left temporal
lobe compatible with mineralization as demonstrated on prior brain
MRI.

## 2016-03-09 IMAGING — CR DG CHEST 2V
2 series · 2 of 2 positions shown · non-contrast
Comparison: 10/31/2013

CLINICAL DATA: Stroke.

EXAM:
CHEST  2 VIEW

[x chest ap]
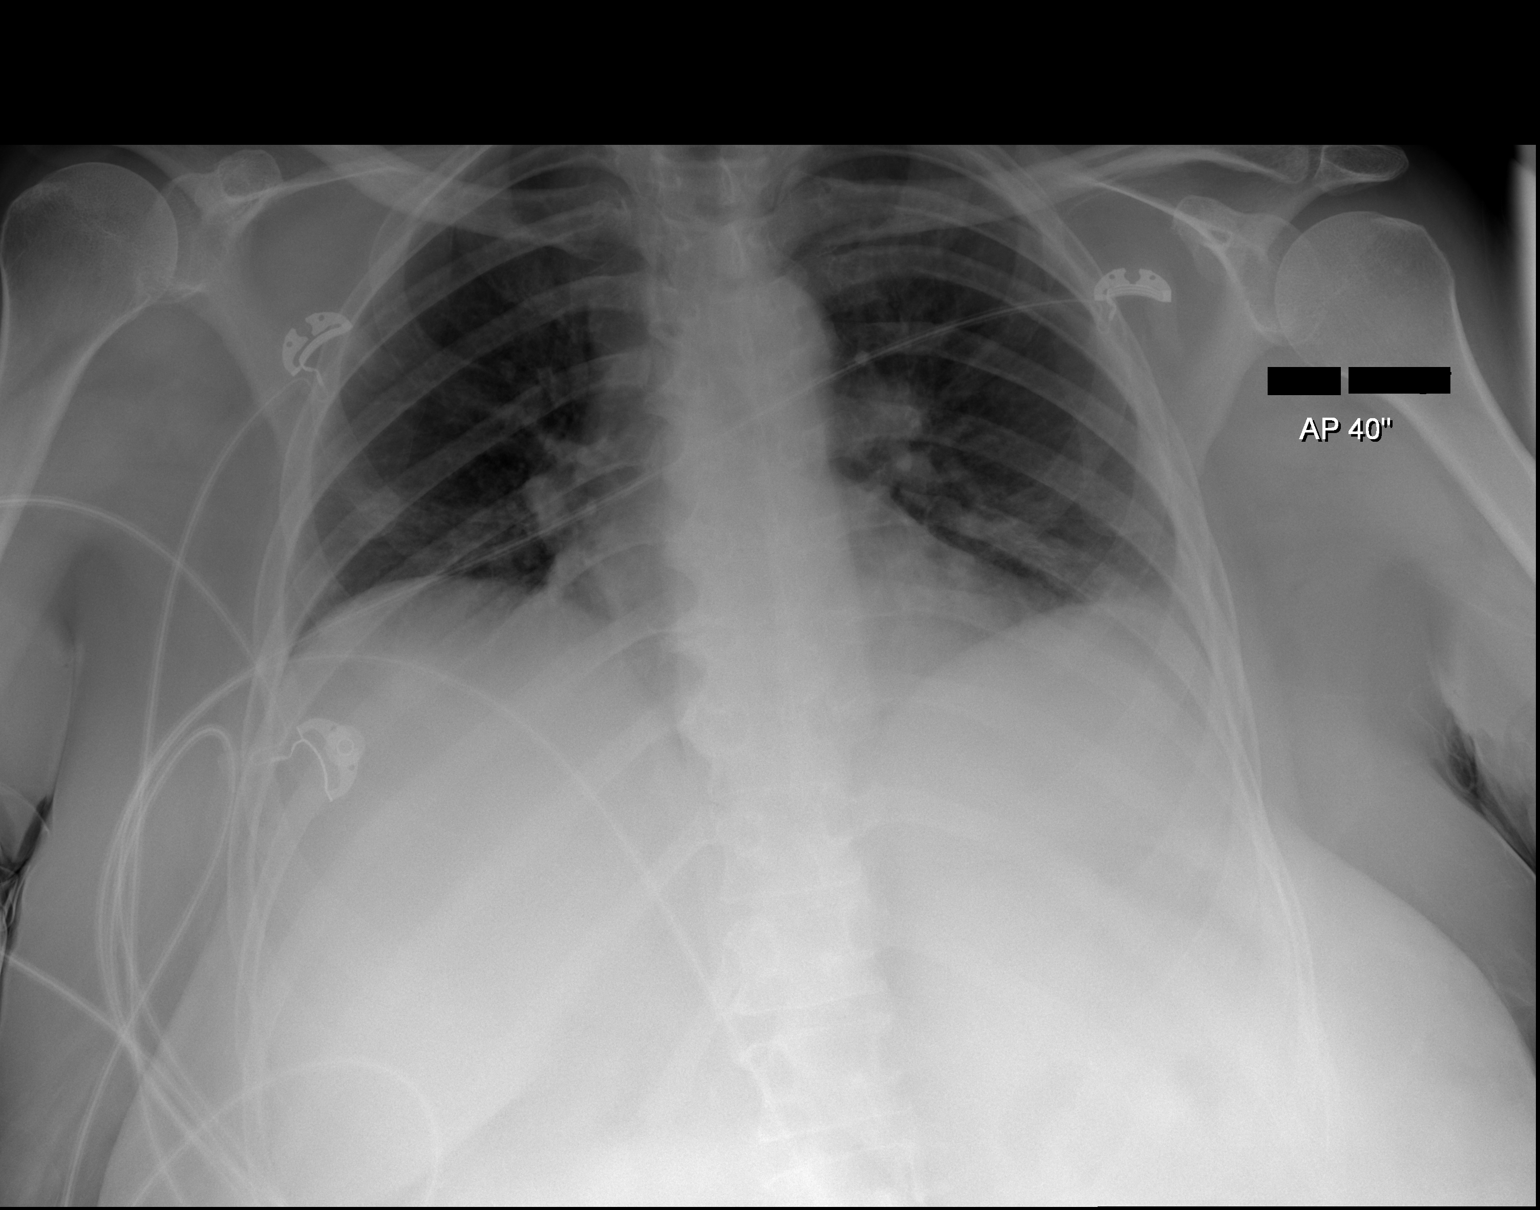

[w chest lat]
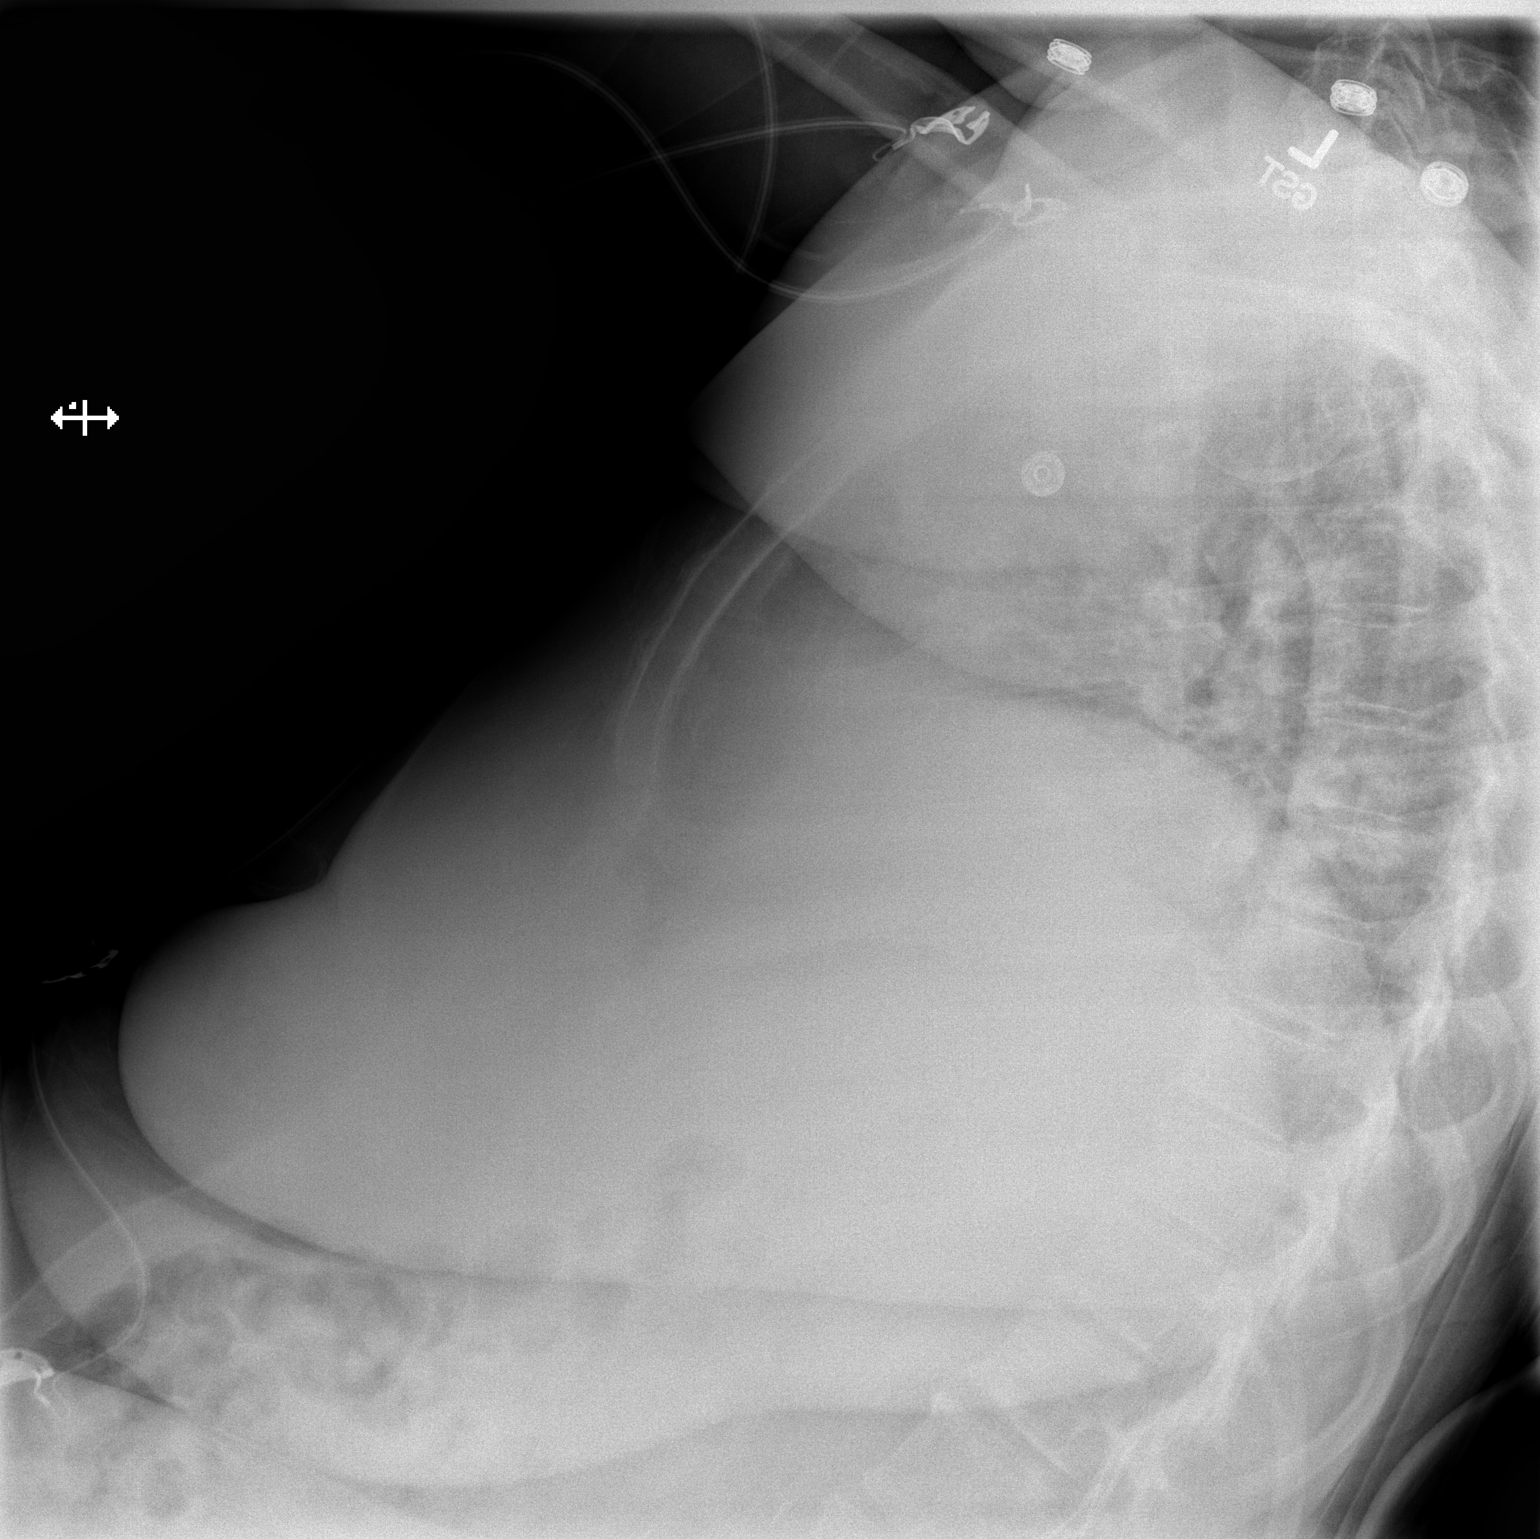

[2 of 2 positions shown; findings below may reference images not displayed]

FINDINGS: Low lung volumes with interstitial crowding and mild atelectasis.
This is confirmed on recent abdominal CT. No edema, effusion, or
pneumothorax. Normal heart size and mediastinal contours. No gross
pneumonia or edema.
IMPRESSION: Low lung volumes with bibasilar atelectasis.

## 2016-03-09 IMAGING — CT CT ABD-PELV W/O CM
2 of 4 series · 16 of 46 positions shown, 18 images · non-contrast
Comparison: None.

CLINICAL DATA: Diffuse generalized abdominal pain. Initial
encounter. 10/22/2013.

EXAM:
CT ABDOMEN AND PELVIS WITHOUT CONTRAST
TECHNIQUE: Multidetector CT imaging of the abdomen and pelvis was performed
following the standard protocol without IV contrast.

[Series 2: stone study 5.0 i30f 1 · axial · 0.79mm/px · z∈[-822,-322]mm · 13 of 110 slices shown, 15 images]
[im 5/110  soft-tissue]
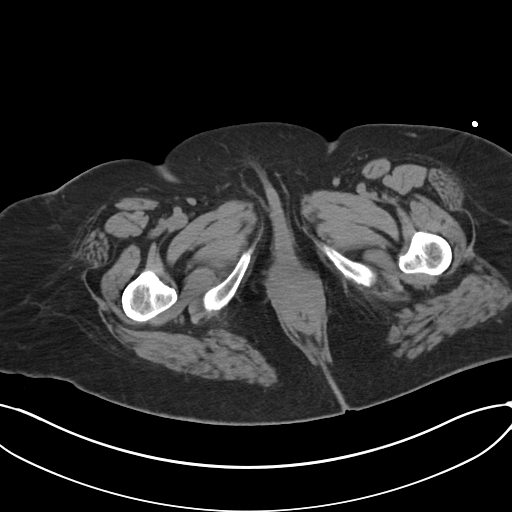
[im 5/110  bone]
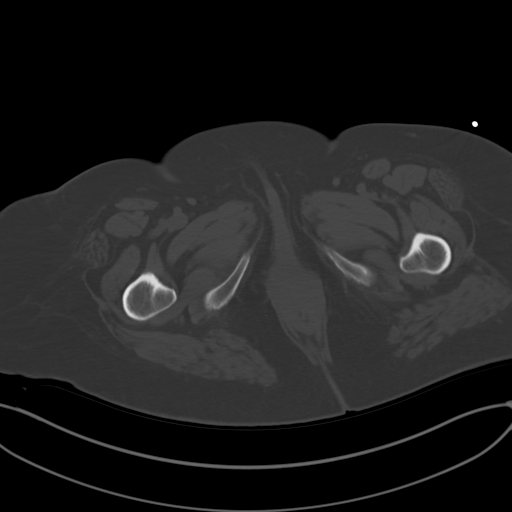
[im 15/110  soft-tissue]
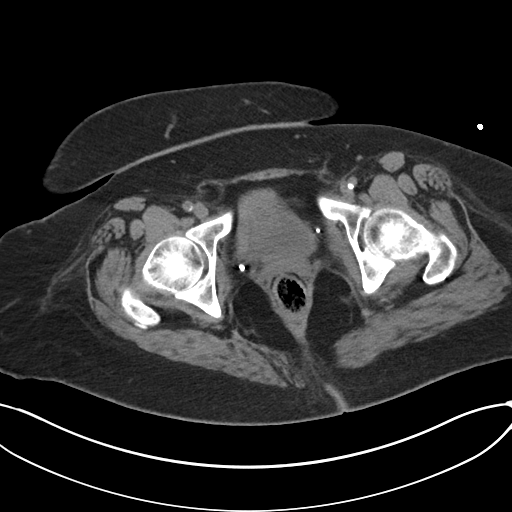
[im 24/110  soft-tissue]
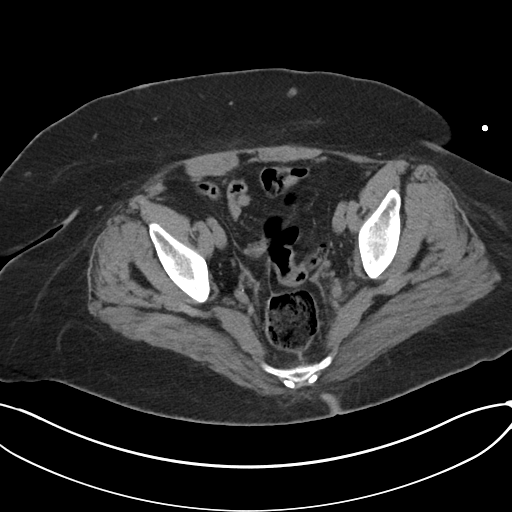
[im 29/110  soft-tissue]
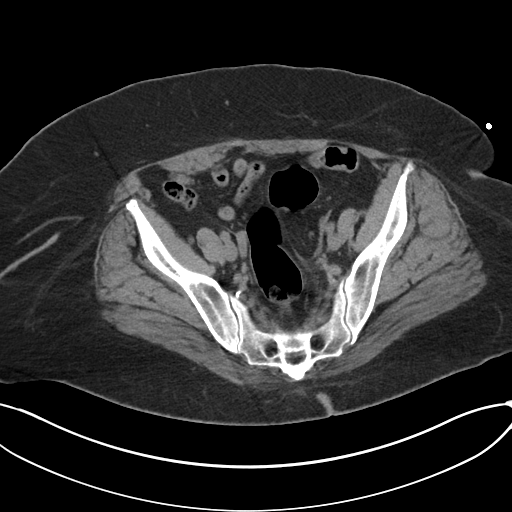
[im 38/110  soft-tissue]
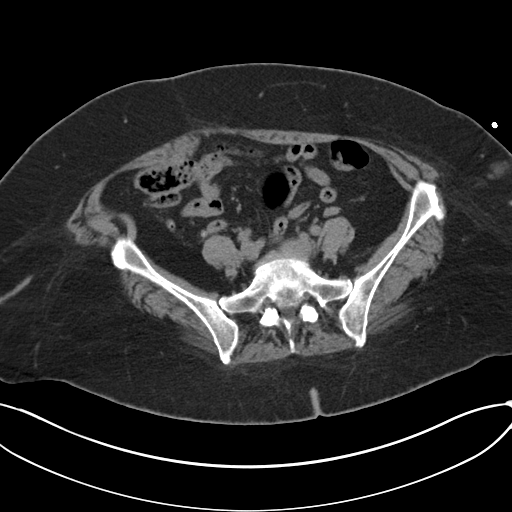
[im 48/110  soft-tissue]
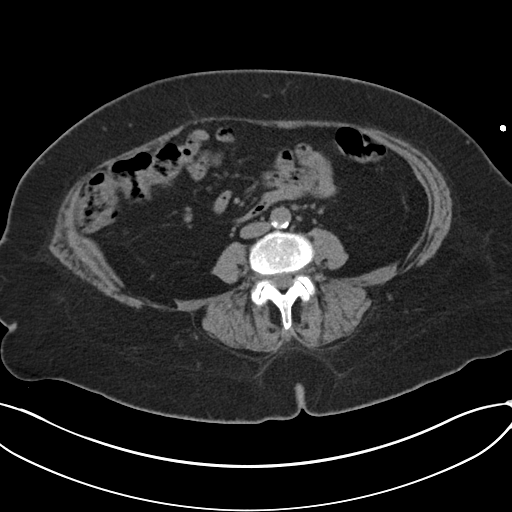
[im 57/110  soft-tissue]
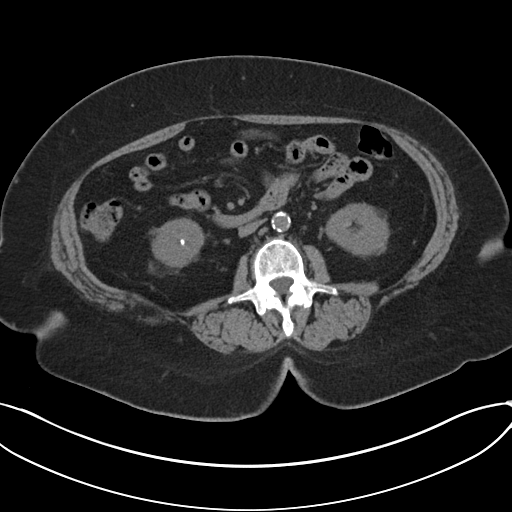
[im 62/110  soft-tissue]
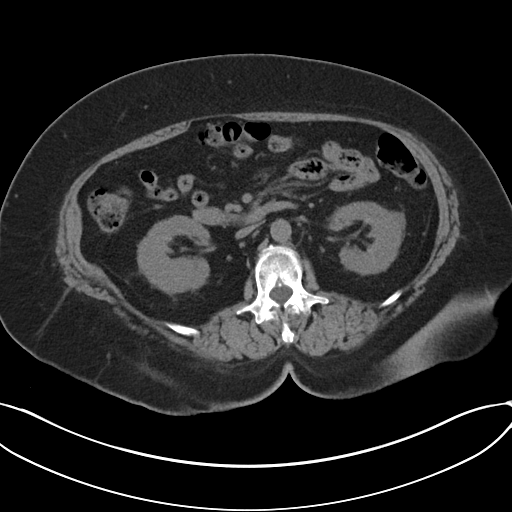
[im 72/110  soft-tissue]
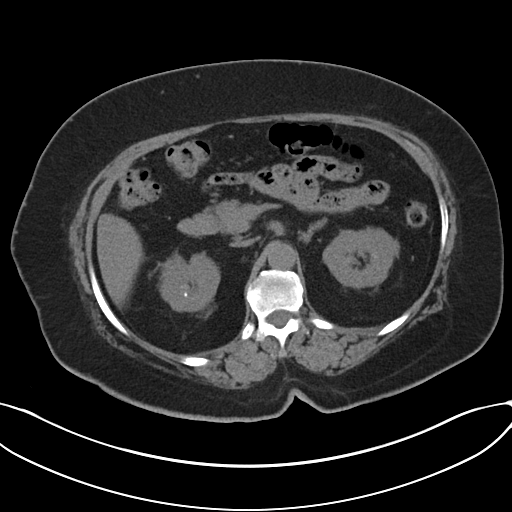
[im 72/110  bone]
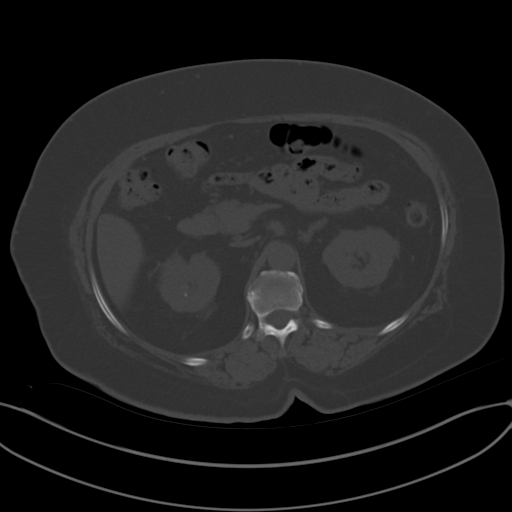
[im 81/110  soft-tissue]
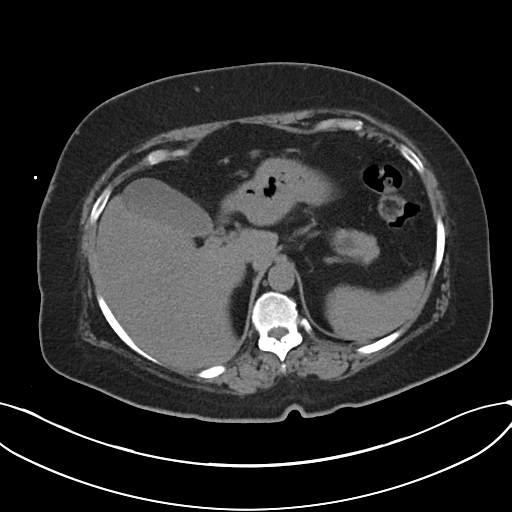
[im 86/110  soft-tissue]
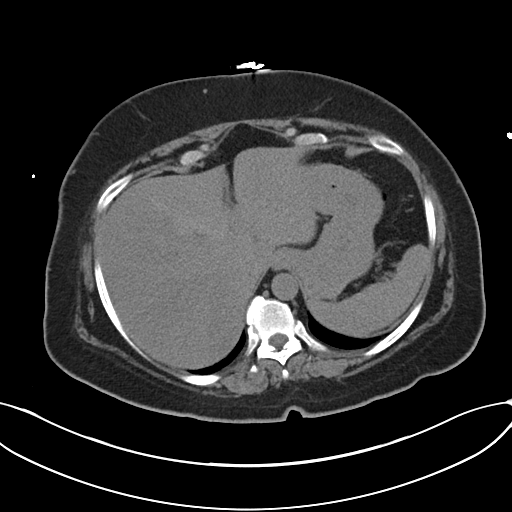
[im 95/110  soft-tissue]
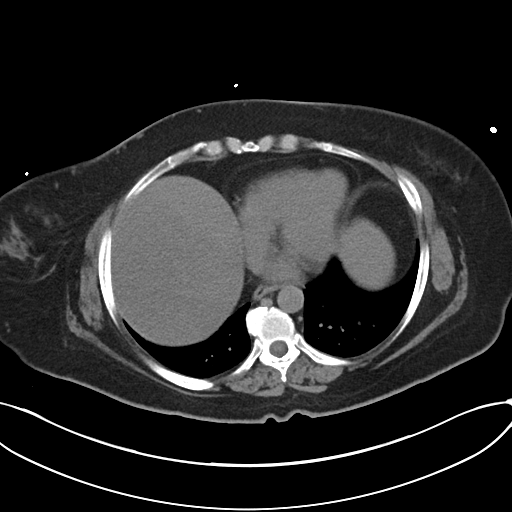
[im 105/110  soft-tissue]
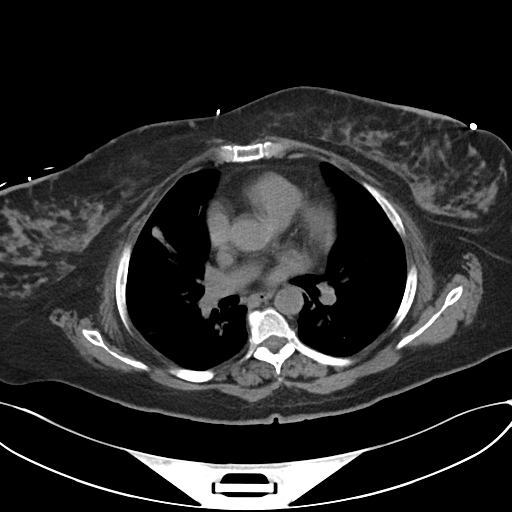

[Series 5: coronal soft tissue · coronal · 0.89mm/px · 3 of 84 slices shown]
[im 28/84  soft-tissue]
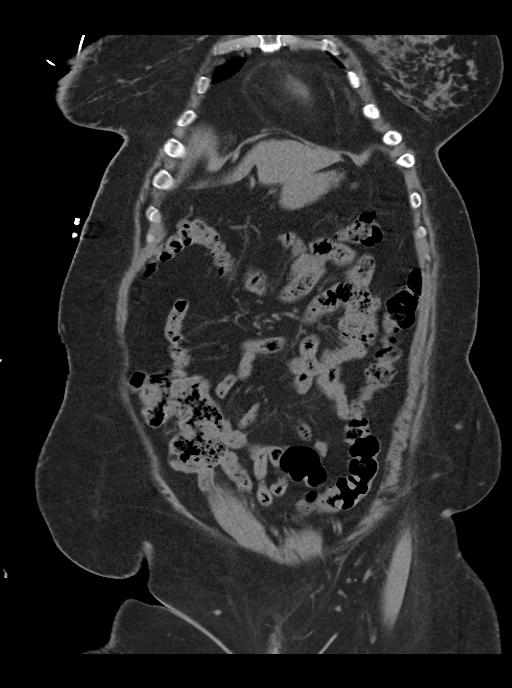
[im 37/84  soft-tissue]
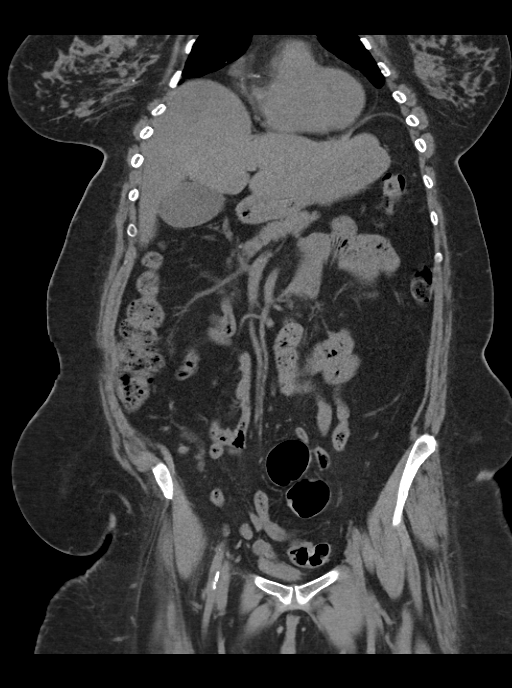
[im 47/84  soft-tissue]
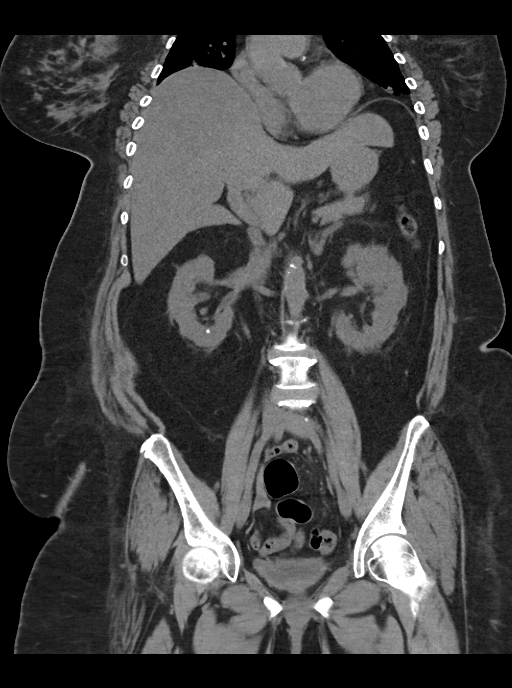

[16 of 46 positions shown; findings below may reference images not displayed]

FINDINGS: BODY WALL: Unremarkable.

LOWER CHEST: Multi focal coronary atherosclerosis. Bandlike opacity
at the right base is similar to previous, consistent with scar.

ABDOMEN/PELVIS:

Liver: Patchy low-density in the liver consistent with steatosis,
more extensive in the right lobe.

Biliary: No evidence of biliary obstruction or stone.

Pancreas: Unremarkable.

Spleen: Unremarkable.

Adrenals: Unremarkable.

Kidneys and ureters: Small, nonobstructive calculi in the upper
poles and right lower pole. The largest is in the right lower pole
at 5 mm. No hydronephrosis.

Bladder: Limited evaluation due to decompressed state.

Reproductive: Hysterectomy. No adnexal mass. Previously noted cyst
has not recurred.

Bowel: No obstruction. Negative appendix. Mild distal colonic
diverticulosis.

Retroperitoneum: No mass or adenopathy.

Peritoneum: No ascites or pneumoperitoneum.

Vascular: No acute abnormality.

OSSEOUS: No acute abnormalities.
IMPRESSION: 1. No acute intra-abdominal findings.
2. Nonobstructive bilateral nephrolithiasis.
3. Hepatic steatosis.

## 2016-06-21 ENCOUNTER — Encounter (HOSPITAL_COMMUNITY): Payer: Self-pay | Admitting: Emergency Medicine

## 2016-06-21 ENCOUNTER — Emergency Department (HOSPITAL_COMMUNITY)
Admission: EM | Admit: 2016-06-21 | Discharge: 2016-06-22 | Disposition: A | Discharge status: Home / Self Care | Payer: Self-pay | Attending: Emergency Medicine | Admitting: Emergency Medicine

## 2016-06-21 DIAGNOSIS — Z8673 Personal history of transient ischemic attack (TIA), and cerebral infarction without residual deficits: Secondary | ICD-10-CM | POA: Insufficient documentation

## 2016-06-21 DIAGNOSIS — E1159 Type 2 diabetes mellitus with other circulatory complications: Secondary | ICD-10-CM | POA: Insufficient documentation

## 2016-06-21 DIAGNOSIS — E039 Hypothyroidism, unspecified: Secondary | ICD-10-CM | POA: Insufficient documentation

## 2016-06-21 DIAGNOSIS — J029 Acute pharyngitis, unspecified: Secondary | ICD-10-CM | POA: Insufficient documentation

## 2016-06-21 DIAGNOSIS — Z7984 Long term (current) use of oral hypoglycemic drugs: Secondary | ICD-10-CM | POA: Insufficient documentation

## 2016-06-21 DIAGNOSIS — I110 Hypertensive heart disease with heart failure: Secondary | ICD-10-CM | POA: Insufficient documentation

## 2016-06-21 DIAGNOSIS — I509 Heart failure, unspecified: Secondary | ICD-10-CM | POA: Insufficient documentation

## 2016-06-21 DIAGNOSIS — Z87891 Personal history of nicotine dependence: Secondary | ICD-10-CM | POA: Insufficient documentation

## 2016-06-21 DIAGNOSIS — J069 Acute upper respiratory infection, unspecified: Secondary | ICD-10-CM | POA: Insufficient documentation

## 2016-06-21 LAB — RAPID STREP SCREEN (MED CTR MEBANE ONLY): Streptococcus, Group A Screen (Direct): NEGATIVE

## 2016-06-21 NOTE — ED Triage Notes (Signed)
C/o congestion, sore throat, and non-productive cough since last night.

## 2016-06-21 NOTE — ED Provider Notes (Signed)
[facility] DEPT Provider Note   CSN: [id number] Arrival date & time: 06/21/16  2158   By signing my name below, I, Delton Prairie, attest that this documentation has been prepared under the direction and in the presence of  Debroah Baller, NP. Electronically Signed: Delton Prairie, ED Scribe. 06/21/16. 11:43 PM.   History   Chief Complaint Chief Complaint  Patient presents with  . Nasal Congestion  . Sore Throat  . Cough   The history is provided by the patient. No language interpreter was used.  Sore Throat  This is a new problem. The current episode started yesterday. The problem occurs constantly. The problem has not changed since onset.Associated symptoms include shortness of breath. Pertinent negatives include no chest pain and no abdominal pain. Nothing aggravates the symptoms. She has tried nothing for the symptoms.  Cough  This is a new problem. The current episode started yesterday. The problem occurs constantly. The problem has not changed since onset.The cough is non-productive. Associated symptoms include sore throat and shortness of breath. Pertinent negatives include no chest pain.   HPI Comments:  Abigail Johns is a 65 y.o. female, hx of stroke and frequent URIs, who presents to the Emergency Department complaining of sudden onset, moderate sore throat x yesterday. Relative notes associated sudden onset cough. Her pain is worse with swallowing. No alleviating factors noted. Pt denies fevers, chest pain, abdominal pain, back pain trouble swallowing, any other associated symptoms and modifying factors at this time.   Past Medical History:  Diagnosis Date  . Back pain, chronic   . CHF (congestive heart failure) ([place])   . Diabetes mellitus   . Edema   . High cholesterol   . Hypertension   . Obesity   . OSA on CPAP   . Renal disorder   . Stroke ([place])   . Thyroid disease     Patient Active Problem List   Diagnosis Date Noted  . Controlled type 2 diabetes mellitus  with circulatory disorder ([place]) 10/30/2015  . Cerebral infarction due to embolism of left middle cerebral artery ([place]) 10/30/2015  . Encounter for loop recorder check 09/07/2015  . Essential hypertension 09/18/2014  . Seizure ([place]) 07/12/2014  . History of CVA with residual deficit 05/30/2014  . Seizures ([place]) 05/11/2014  . CVA (cerebral infarction) 05/08/2014  . Diabetes mellitus type 2, controlled ([place]) 05/08/2014  . Hypothyroidism 05/08/2014  . Hypercholesteremia 11/29/2013  . Status post laparoscopy 11/01/2013  . OSA on CPAP 10/31/2013  . HTN (hypertension) 10/31/2013  . Morbid obesity ([place]) 10/31/2013  . Leg edema 10/31/2013  . Ovarian cyst 10/31/2013  . Preoperative cardiovascular examination 10/31/2013  . Pelvic mass 10/31/2013  . DM type 2 (diabetes mellitus, type 2) ([place]) 10/31/2013  . History of CHF (congestive heart failure) 10/31/2013  . Acute renal insufficiency 10/31/2013  . Unspecified hypothyroidism 10/31/2013    Past Surgical History:  Procedure Laterality Date  . ABDOMINAL HYSTERECTOMY    . BUNIONECTOMY     both feet  . EYE SURGERY    . LAPAROSCOPIC LYSIS OF ADHESIONS N/A 11/01/2013   Procedure: LAPAROSCOPIC LYSIS OF ADHESIONS;  Surgeon: Ena Dawley, MD;  Location: [facility];  Service: Gynecology;  Laterality: N/A;  . LAPAROSCOPIC OVARIAN CYSTECTOMY Right 11/01/2013   Procedure: LAPAROSCOPIC RIGHT OVARIAN CYSTECTOMY WITH COLLECTION OF WASHINGS ;  Surgeon: Ena Dawley, MD;  Location: [facility];  Service: Gynecology;  Laterality: Right;  . LAPAROSCOPY N/A 11/01/2013   Procedure: LAPAROSCOPY DIAGNOSTIC;  Surgeon: Ena Dawley, MD;  Location:  [facility];  Service: Gynecology;  Laterality: N/A;  . left knee surgery     Torn meniscus  . LOOP RECORDER IMPLANT N/A 05/11/2014   Procedure: LOOP RECORDER IMPLANT;  Surgeon: Coralyn Mark, MD;  Location: [facility];  Service: Cardiovascular;  Laterality: N/A;  . NEPHROLITHOTOMY     Right  . TEE WITHOUT CARDIOVERSION N/A  05/11/2014   Procedure: TRANSESOPHAGEAL ECHOCARDIOGRAM (TEE);  Surgeon: Sanda Klein, MD;  Location: [facility];  Service: Cardiovascular;  Laterality: N/A;  . US ECHOCARDIOGRAPHY  12/24/2011   mild LVH,mild TR,trace MR,PI    OB History    No data available       Home Medications    Prior to Admission medications   Medication Sig Start Date End Date Taking? Authorizing Provider  allopurinol (ZYLOPRIM) 100 MG tablet Take 100 mg by mouth daily.  05/21/14   Historical Provider, MD  buPROPion (WELLBUTRIN) 100 MG tablet Take 100 mg by mouth 3 (three) times daily.    Historical Provider, MD  clopidogrel (PLAVIX) 75 MG tablet Take 1 tablet (75 mg total) by mouth daily. 09/18/14   Rosalin Hawking, MD  cycloSPORINE (RESTASIS) 0.05 % ophthalmic emulsion Place 1 drop into both eyes 2 (two) times daily.    Historical Provider, MD  escitalopram (LEXAPRO) 5 MG tablet Take 5 mg by mouth daily.  04/26/14   Historical Provider, MD  ezetimibe (ZETIA) 10 MG tablet Take 10 mg by mouth daily.    Historical Provider, MD  ferrous sulfate 325 (65 FE) MG tablet Take 325 mg by mouth daily with breakfast.    Historical Provider, MD  furosemide (LASIX) 40 MG tablet Take 40 mg by mouth daily as needed.    Historical Provider, MD  gabapentin (NEURONTIN) 600 MG tablet Take 600 mg by mouth at bedtime.    Historical Provider, MD  glyBURIDE (DIABETA) 5 MG tablet Take 5 mg by mouth daily with supper.    Historical Provider, MD  levothyroxine (SYNTHROID, LEVOTHROID) 75 MCG tablet Take 75 mcg by mouth daily.    Historical Provider, MD  meloxicam (MOBIC) 15 MG tablet Take 15 mg by mouth daily.    Historical Provider, MD  metFORMIN (GLUCOPHAGE) 500 MG tablet Take 500 mg by mouth 2 (two) times daily with a meal.    Historical Provider, MD  methylphenidate (CONCERTA) 36 MG CR tablet Take 36 mg by mouth every morning.    Historical Provider, MD  mupirocin cream (BACTROBAN) 2 % Apply topically 2 (two) times daily. Patient taking  differently: Apply 1 application topically 2 (two) times daily as needed (redness/irritation).  05/11/14   Kelvin Cellar, MD  nebivolol (BYSTOLIC) 5 MG tablet Take 5 mg by mouth daily.    Historical Provider, MD  [facility] VERIO test strip  05/21/14   Historical Provider, MD  pantoprazole (PROTONIX) 40 MG tablet Take 40 mg by mouth daily.    Historical Provider, MD  temazepam (RESTORIL) 30 MG capsule Take 30 mg by mouth at bedtime as needed for sleep.    Historical Provider, MD  Vitamin D, Ergocalciferol, (DRISDOL) 50000 UNITS CAPS Take 50,000 Units by mouth 2 (two) times a week. Mondays and fridays    Historical Provider, MD  [facility] 3.75 G PACK Take 3.75 g by mouth daily.  05/04/14   Historical Provider, MD    Family History Family History  Problem Relation Age of Onset  . Diabetes Mother   . Hyperlipidemia Mother   . Hypertension Mother   . Stroke Mother   .  Hypertension Father   . Diabetes Father   . Cancer Father   . Heart attack Brother   . Hyperlipidemia Brother   . Hypertension Brother     Social History Social History  Substance Use Topics  . Smoking status: Former Smoker    Quit date: 07/26/2002  . Smokeless tobacco: Never Used  . Alcohol use No     Allergies   Lipitor [atorvastatin calcium] and Crestor [rosuvastatin]   Review of Systems Review of Systems  Constitutional: Negative for fever.  HENT: Positive for sore throat. Negative for trouble swallowing.   Respiratory: Positive for cough and shortness of breath.   Cardiovascular: Negative for chest pain.  Gastrointestinal: Negative for abdominal pain.  Musculoskeletal: Negative for back pain.  Neurological: Positive for weakness (due to previous stroke).     Physical Exam Updated Vital Signs BP 137/57 (BP Location: Right Arm)   Pulse 75   Temp 98 F (36.7 C) (Oral)   Resp 18   SpO2 98%   Physical Exam  Constitutional: She is oriented to person, place, and time. She appears well-developed and  well-nourished. No distress.  HENT:  Head: Normocephalic and atraumatic.  Right Ear: Tympanic membrane normal.  Left Ear: Tympanic membrane normal.  Mouth/Throat: Uvula is midline. Posterior oropharyngeal erythema (mild) present. No posterior oropharyngeal edema.  Eyes: Conjunctivae are normal.  Cardiovascular: Normal rate.   Pulmonary/Chest: Effort normal.  Slightly decreased breath sounds on the right  Abdominal: She exhibits no distension.  Lymphadenopathy:    She has cervical adenopathy.  Neurological: She is alert and oriented to person, place, and time.  Skin: Skin is warm and dry.  Psychiatric: She has a normal mood and affect.  Nursing note and vitals reviewed.   ED Treatments / Results  DIAGNOSTIC STUDIES:  Oxygen Saturation is 97% on RA, normal by my interpretation.    COORDINATION OF CARE:  11:42 PM Discussed treatment plan with pt at bedside and pt agreed to plan.  Labs (all labs ordered are listed, but only abnormal results are displayed) Labs Reviewed  RAPID STREP SCREEN (NOT AT [facility])  CULTURE, GROUP A STREP [facility])  Radiology Dg Chest 2 View  Result Date: 06/22/2016 CLINICAL DATA:  Congestion and cough tonight.  History of stroke. EXAM: CHEST  2 VIEW COMPARISON:  01/23/2015 FINDINGS: Shallow inspiration with linear atelectasis or fibrosis in the lung bases, unchanged since prior study. No focal airspace disease or consolidation. No blunting of costophrenic angles. No pneumothorax. Normal heart size and pulmonary vascularity. Loop recorder. Calcification of the aorta. Degenerative changes in the spine. IMPRESSION: Linear atelectasis or fibrosis in the lung bases. No evidence of active pulmonary disease. Electronically Signed   By: Lucienne Capers M.D.   On: 06/22/2016 00:34    Procedures Procedures (including critical care time)  Medications Ordered in ED Medications  ibuprofen (ADVIL,MOTRIN) 100 MG/5ML suspension 400 mg (400 mg Oral Given 06/22/16 0120)      Initial Impression / Assessment and Plan / ED Course  I have reviewed the triage vital signs and the nursing notes.  Pertinent labs & imaging results that were available during my care of the patient were reviewed by me and considered in my medical decision making (see chart for details).  Clinical Course     Pt symptoms consistent with URI. CXR negative for acute infiltrate. Patient swallowing her saliva without difficulty. Pt will be discharged with symptomatic treatment.  Discussed return precautions.  Pt is hemodynamically stable & in NAD prior to  discharge. She will f/u with her PCP if the sore throat continues. She will return here as needed.    Final Clinical Impressions(s) / ED Diagnoses   Final diagnoses:  Sore throat  Viral upper respiratory tract infection    New Prescriptions New Prescriptions   No medications on file  *I personally performed the services described in this documentation, which was scribed in my presence. The recorded information has been reviewed and is accurate.     [address] [place], [place] 06/22/16 [id number]    Merryl Hacker, MD 06/22/16 [phone]

## 2016-06-22 ENCOUNTER — Emergency Department (HOSPITAL_COMMUNITY): Payer: Self-pay

## 2016-06-22 MED ORDER — IBUPROFEN 100 MG/5ML PO SUSP
400.0000 mg | Freq: Once | ORAL | Status: AC
  Administered 2016-06-22: 400 mg via ORAL
  Filled 2016-06-22: qty 20

## 2016-06-22 NOTE — Discharge Instructions (Signed)
Your strep screen tonight is negative. We have sent it for culture. If you need treatment someone will call you. Take tylenol for your sore throat. Use a cool mist vaporizer, drink plenty of fluids and follow up with your doctor.  Your chest x-ray tonight shows not pneumonia.

## 2016-06-24 LAB — CULTURE, GROUP A STREP (THRC)

## 2016-06-25 ENCOUNTER — Emergency Department (HOSPITAL_COMMUNITY)
Admission: EM | Admit: 2016-06-25 | Discharge: 2016-07-27 | Disposition: E | Payer: Self-pay | Attending: Emergency Medicine | Admitting: Emergency Medicine

## 2016-06-25 ENCOUNTER — Emergency Department (HOSPITAL_COMMUNITY): Payer: Self-pay

## 2016-06-25 ENCOUNTER — Encounter (HOSPITAL_COMMUNITY): Payer: Self-pay | Admitting: Emergency Medicine

## 2016-06-25 DIAGNOSIS — I10 Essential (primary) hypertension: Secondary | ICD-10-CM | POA: Insufficient documentation

## 2016-06-25 DIAGNOSIS — E039 Hypothyroidism, unspecified: Secondary | ICD-10-CM | POA: Insufficient documentation

## 2016-06-25 DIAGNOSIS — Z8673 Personal history of transient ischemic attack (TIA), and cerebral infarction without residual deficits: Secondary | ICD-10-CM | POA: Insufficient documentation

## 2016-06-25 DIAGNOSIS — R112 Nausea with vomiting, unspecified: Secondary | ICD-10-CM

## 2016-06-25 DIAGNOSIS — I469 Cardiac arrest, cause unspecified: Secondary | ICD-10-CM

## 2016-06-25 DIAGNOSIS — K8020 Calculus of gallbladder without cholecystitis without obstruction: Secondary | ICD-10-CM

## 2016-06-25 DIAGNOSIS — Z87891 Personal history of nicotine dependence: Secondary | ICD-10-CM | POA: Insufficient documentation

## 2016-06-25 DIAGNOSIS — E1159 Type 2 diabetes mellitus with other circulatory complications: Secondary | ICD-10-CM | POA: Insufficient documentation

## 2016-06-25 DIAGNOSIS — R1033 Periumbilical pain: Secondary | ICD-10-CM

## 2016-06-25 LAB — COMPREHENSIVE METABOLIC PANEL
ALT: 23 U/L (ref 14–54)
AST: 105 U/L — ABNORMAL HIGH (ref 15–41)
Albumin: 3.7 g/dL (ref 3.5–5.0)
Alkaline Phosphatase: 97 U/L (ref 38–126)
Anion gap: 12 (ref 5–15)
BUN: 10 mg/dL (ref 6–20)
CALCIUM: 9.1 mg/dL (ref 8.9–10.3)
CO2: 22 mmol/L (ref 22–32)
CREATININE: 0.67 mg/dL (ref 0.44–1.00)
Chloride: 102 mmol/L (ref 101–111)
GFR calc Af Amer: >60 mL/min (ref >60)
Glucose, Bld: 215 mg/dL — ABNORMAL HIGH (ref 65–99)
POTASSIUM: 3.7 mmol/L (ref 3.5–5.1)
Sodium: 136 mmol/L (ref 135–145)
Total Bilirubin: 0.5 mg/dL (ref 0.3–1.2)
Total Protein: 7.3 g/dL (ref 6.5–8.1)

## 2016-06-25 LAB — URINALYSIS, ROUTINE W REFLEX MICROSCOPIC
BILIRUBIN URINE: NEGATIVE
Glucose, UA: 250 mg/dL — AB
KETONES UR: 15 mg/dL — AB
LEUKOCYTES UA: NEGATIVE
NITRITE: NEGATIVE
PROTEIN: 30 mg/dL — AB
Specific Gravity, Urine: 1.028 (ref 1.005–1.030)
pH: 5 (ref 5.0–8.0)

## 2016-06-25 LAB — CBC WITH DIFFERENTIAL/PLATELET
BASOS ABS: 0 10*3/uL (ref 0.0–0.1)
BASOS PCT: 0 %
EOS ABS: 0 10*3/uL (ref 0.0–0.7)
EOS PCT: 0 %
HCT: 43 % (ref 36.0–46.0)
Hemoglobin: 14.9 g/dL (ref 12.0–15.0)
Lymphocytes Relative: 8 %
Lymphs Abs: 0.8 10*3/uL (ref 0.7–4.0)
MCH: 31.5 pg (ref 26.0–34.0)
MCHC: 34.7 g/dL (ref 30.0–36.0)
MCV: 90.9 fL (ref 78.0–100.0)
MONO ABS: 0.3 10*3/uL (ref 0.1–1.0)
Monocytes Relative: 3 %
Neutro Abs: 8.9 10*3/uL — ABNORMAL HIGH (ref 1.7–7.7)
Neutrophils Relative %: 89 %
PLATELETS: 280 10*3/uL (ref 150–400)
RBC: 4.73 MIL/uL (ref 3.87–5.11)
RDW: 13.1 % (ref 11.5–15.5)
WBC: 10 10*3/uL (ref 4.0–10.5)

## 2016-06-25 LAB — URINE MICROSCOPIC-ADD ON

## 2016-06-25 LAB — LIPASE, BLOOD: LIPASE: 24 U/L (ref 11–51)

## 2016-06-25 LAB — CBG MONITORING, ED: Glucose-Capillary: 193 mg/dL — ABNORMAL HIGH (ref 65–99)

## 2016-06-25 MED ORDER — ONDANSETRON HCL 4 MG/2ML IJ SOLN
4.0000 mg | Freq: Once | INTRAMUSCULAR | Status: AC
  Administered 2016-06-25: 4 mg via INTRAVENOUS
  Filled 2016-06-25: qty 2

## 2016-06-25 MED ORDER — IOPAMIDOL (ISOVUE-300) INJECTION 61%
INTRAVENOUS | Status: AC
  Administered 2016-06-25: 100 mL
  Filled 2016-06-25: qty 100

## 2016-06-25 MED ORDER — FENTANYL CITRATE (PF) 100 MCG/2ML IJ SOLN
50.0000 ug | Freq: Once | INTRAMUSCULAR | Status: AC
  Administered 2016-06-25: 50 ug via INTRAVENOUS
  Filled 2016-06-25: qty 2

## 2016-06-25 MED ORDER — ONDANSETRON 4 MG PO TBDP: 4.0000 mg | ORAL_TABLET | Freq: Three times a day (TID) | ORAL | 0 refills | Status: AC | PRN

## 2016-06-25 MED ORDER — FENTANYL CITRATE (PF) 100 MCG/2ML IJ SOLN
100.0000 ug | Freq: Once | INTRAMUSCULAR | Status: AC
  Administered 2016-06-25: 100 ug via INTRAVENOUS
  Filled 2016-06-25: qty 2

## 2016-06-25 MED ORDER — HYDROCODONE-ACETAMINOPHEN 5-325 MG PO TABS: 1.0000 | ORAL_TABLET | ORAL | 0 refills | Status: AC | PRN

## 2016-06-25 NOTE — ED Triage Notes (Signed)
Pt to ED via [facility] with c/o generalized abd pain, nausea and vomiting.  Onset today after eating processed meat last pm.  Pt denies diarrhea, st's last BM 3 days ago.

## 2016-06-25 NOTE — ED Provider Notes (Signed)
[facility] DEPT Provider Note   CSN: [id number] Arrival date & time: 06/01/2016  1851     History   Chief Complaint Chief Complaint  Patient presents with  . Emesis    HPI Abigail Johns is a 65 y.o. female.  HPI  65 year old female presents with vomiting and abdominal pain. Also had a history of stroke that has affected her speech so the history is somewhat limited as she has a hard time getting words out. Vomiting and abdominal pain started around 9 AM today. His periumbilical. At least 3 episodes of emesis, none with blood. Last BM was this morning and normal. No fevers. Family told EMS that the patient had processed meat last night and typically does not eat meat. No urinary symptoms. No chest pain.  Past Medical History:  Diagnosis Date  . Back pain, chronic   . CHF (congestive heart failure) ([place])   . Diabetes mellitus   . Edema   . High cholesterol   . Hypertension   . Obesity   . OSA on CPAP   . Renal disorder   . Stroke ([place])   . Thyroid disease     Patient Active Problem List   Diagnosis Date Noted  . Controlled type 2 diabetes mellitus with circulatory disorder ([place]) 10/30/2015  . Cerebral infarction due to embolism of left middle cerebral artery ([place]) 10/30/2015  . Encounter for loop recorder check 09/07/2015  . Essential hypertension 09/18/2014  . Seizure ([place]) 07/12/2014  . History of CVA with residual deficit 05/30/2014  . Seizures ([place]) 05/11/2014  . CVA (cerebral infarction) 05/08/2014  . Diabetes mellitus type 2, controlled ([place]) 05/08/2014  . Hypothyroidism 05/08/2014  . Hypercholesteremia 11/29/2013  . Status post laparoscopy 11/01/2013  . OSA on CPAP 10/31/2013  . HTN (hypertension) 10/31/2013  . Morbid obesity ([place]) 10/31/2013  . Leg edema 10/31/2013  . Ovarian cyst 10/31/2013  . Preoperative cardiovascular examination 10/31/2013  . Pelvic mass 10/31/2013  . DM type 2 (diabetes mellitus, type 2) ([place]) 10/31/2013  . History of CHF  (congestive heart failure) 10/31/2013  . Acute renal insufficiency 10/31/2013  . Unspecified hypothyroidism 10/31/2013    Past Surgical History:  Procedure Laterality Date  . ABDOMINAL HYSTERECTOMY    . BUNIONECTOMY     both feet  . EYE SURGERY    . LAPAROSCOPIC LYSIS OF ADHESIONS N/A 11/01/2013   Procedure: LAPAROSCOPIC LYSIS OF ADHESIONS;  Surgeon: Ena Dawley, MD;  Location: [facility];  Service: Gynecology;  Laterality: N/A;  . LAPAROSCOPIC OVARIAN CYSTECTOMY Right 11/01/2013   Procedure: LAPAROSCOPIC RIGHT OVARIAN CYSTECTOMY WITH COLLECTION OF WASHINGS ;  Surgeon: Ena Dawley, MD;  Location: [facility];  Service: Gynecology;  Laterality: Right;  . LAPAROSCOPY N/A 11/01/2013   Procedure: LAPAROSCOPY DIAGNOSTIC;  Surgeon: Ena Dawley, MD;  Location: [facility];  Service: Gynecology;  Laterality: N/A;  . left knee surgery     Torn meniscus  . LOOP RECORDER IMPLANT N/A 05/11/2014   Procedure: LOOP RECORDER IMPLANT;  Surgeon: Coralyn Mark, MD;  Location: [facility];  Service: Cardiovascular;  Laterality: N/A;  . NEPHROLITHOTOMY     Right  . TEE WITHOUT CARDIOVERSION N/A 05/11/2014   Procedure: TRANSESOPHAGEAL ECHOCARDIOGRAM (TEE);  Surgeon: Sanda Klein, MD;  Location: [facility];  Service: Cardiovascular;  Laterality: N/A;  . US ECHOCARDIOGRAPHY  12/24/2011   mild LVH,mild TR,trace MR,PI    OB History    No data available       Home Medications    Prior to Admission  medications   Medication Sig Start Date End Date Taking? Authorizing Provider  allopurinol (ZYLOPRIM) 100 MG tablet Take 100 mg by mouth daily.  05/21/14  Yes Historical Provider, MD  buPROPion (WELLBUTRIN) 100 MG tablet Take 100 mg by mouth 3 (three) times daily.   Yes Historical Provider, MD  clopidogrel (PLAVIX) 75 MG tablet Take 1 tablet (75 mg total) by mouth daily. 09/18/14  Yes Rosalin Hawking, MD  cycloSPORINE (RESTASIS) 0.05 % ophthalmic emulsion Place 1 drop into both eyes 2 (two) times daily.   Yes  Historical Provider, MD  escitalopram (LEXAPRO) 5 MG tablet Take 5 mg by mouth daily.  04/26/14  Yes Historical Provider, MD  ezetimibe (ZETIA) 10 MG tablet Take 10 mg by mouth daily.   Yes Historical Provider, MD  ferrous sulfate 325 (65 FE) MG tablet Take 325 mg by mouth daily with breakfast.   Yes Historical Provider, MD  furosemide (LASIX) 40 MG tablet Take 40 mg by mouth daily as needed.   Yes Historical Provider, MD  gabapentin (NEURONTIN) 600 MG tablet Take 600 mg by mouth at bedtime.   Yes Historical Provider, MD  glyBURIDE (DIABETA) 5 MG tablet Take 5 mg by mouth daily with supper.   Yes Historical Provider, MD  levothyroxine (SYNTHROID, LEVOTHROID) 75 MCG tablet Take 75 mcg by mouth daily.   Yes Historical Provider, MD  meloxicam (MOBIC) 15 MG tablet Take 15 mg by mouth daily.   Yes Historical Provider, MD  mupirocin cream (BACTROBAN) 2 % Apply topically 2 (two) times daily. Patient taking differently: Apply 1 application topically 2 (two) times daily as needed (redness/irritation).  05/11/14  Yes Kelvin Cellar, MD  nebivolol (BYSTOLIC) 5 MG tablet Take 5 mg by mouth daily.   Yes Historical Provider, MD  pantoprazole (PROTONIX) 40 MG tablet Take 40 mg by mouth daily.   Yes Historical Provider, MD  temazepam (RESTORIL) 30 MG capsule Take 30 mg by mouth at bedtime as needed for sleep.   Yes Historical Provider, MD  Vitamin D, Ergocalciferol, (DRISDOL) 50000 UNITS CAPS Take 50,000 Units by mouth 2 (two) times a week. Mondays and fridays   Yes Historical Provider, MD  [facility] 3.75 G PACK Take 3.75 g by mouth daily.  05/04/14  Yes Historical Provider, MD  HYDROcodone-acetaminophen (NORCO) 5-325 MG tablet Take 1-2 tablets by mouth every 4 (four) hours as needed. 05/27/2016   Sherwood Gambler, MD  ondansetron (ZOFRAN ODT) 4 MG disintegrating tablet Take 1 tablet (4 mg total) by mouth every 8 (eight) hours as needed for nausea or vomiting. 06/13/2016   Sherwood Gambler, MD    Family History Family  History  Problem Relation Age of Onset  . Diabetes Mother   . Hyperlipidemia Mother   . Hypertension Mother   . Stroke Mother   . Hypertension Father   . Diabetes Father   . Cancer Father   . Heart attack Brother   . Hyperlipidemia Brother   . Hypertension Brother     Social History Social History  Substance Use Topics  . Smoking status: Former Smoker    Quit date: 07/26/2002  . Smokeless tobacco: Never Used  . Alcohol use No     Allergies   Crestor [rosuvastatin] and Lipitor [atorvastatin calcium]   Review of Systems Review of Systems  Constitutional: Negative for fever.  Respiratory: Negative for shortness of breath.   Cardiovascular: Negative for chest pain.  Gastrointestinal: Positive for abdominal pain, nausea and vomiting. Negative for constipation and diarrhea.  Genitourinary: Negative  for dysuria.  Musculoskeletal: Negative for back pain.  All other systems reviewed and are negative.    Physical Exam Updated Vital Signs BP 140/82   Pulse 82   Temp 98.9 F (37.2 C) (Oral)   Resp 25   SpO2 94%   Physical Exam  Constitutional: She is oriented to person, place, and time. She appears well-developed and well-nourished.  HENT:  Head: Normocephalic and atraumatic.  Right Ear: External ear normal.  Left Ear: External ear normal.  Nose: Nose normal.  Eyes: Right eye exhibits no discharge. Left eye exhibits no discharge.  Cardiovascular: Normal rate, regular rhythm and normal heart sounds.   Pulmonary/Chest: Effort normal and breath sounds normal.  Abdominal: Soft. There is tenderness (diffuse, worst periumbilical and lower).  Neurological: She is alert and oriented to person, place, and time.  Skin: Skin is warm and dry. She is not diaphoretic.  Nursing note and vitals reviewed.    ED Treatments / Results  Labs (all labs ordered are listed, but only abnormal results are displayed) Labs Reviewed  COMPREHENSIVE METABOLIC PANEL - Abnormal; Notable for  the following:       Result Value   Glucose, Bld 215 (*)    AST 105 (*)    All other components within normal limits  CBC WITH DIFFERENTIAL/PLATELET - Abnormal; Notable for the following:    Neutro Abs 8.9 (*)    All other components within normal limits  URINALYSIS, ROUTINE W REFLEX MICROSCOPIC (NOT AT [facility]) - Abnormal; Notable for the following:    Color, Urine AMBER (*)    APPearance CLOUDY (*)    Glucose, UA 250 (*)    Hgb urine dipstick TRACE (*)    Ketones, ur 15 (*)    Protein, ur 30 (*)    All other components within normal limits  URINE MICROSCOPIC-ADD ON - Abnormal; Notable for the following:    Squamous Epithelial / LPF 6-30 (*)    Bacteria, UA FEW (*)    Casts HYALINE CASTS (*)    All other components within normal limits  CBG MONITORING, ED - Abnormal; Notable for the following:    Glucose-Capillary 193 (*)    All other components within normal limits  LIPASE, BLOOD    EKG  EKG Interpretation None       Radiology Ct Abdomen Pelvis W Contrast  Result Date: 06/12/2016 CLINICAL DATA:  Abdominal pain with vomiting EXAM: CT ABDOMEN AND PELVIS WITH CONTRAST TECHNIQUE: Multidetector CT imaging of the abdomen and pelvis was performed using the standard protocol following bolus administration of intravenous contrast. CONTRAST:  127mL ISOVUE-300 IOPAMIDOL (ISOVUE-300) INJECTION 61% COMPARISON:  05/09/2014 FINDINGS: Lower chest: Patchy dependent atelectasis at the posterior lung bases. No pleural effusion. Heart size nonenlarged. Hepatobiliary: No focal hepatic abnormality is seen. Gas containing stones present within the gallbladder. No wall thickening. No biliary dilatation. Pancreas: Unremarkable. No pancreatic ductal dilatation or surrounding inflammatory changes. Spleen: Normal in size without focal abnormality. Adrenals/Urinary Tract: Adrenal glands are within normal limits. There are nonobstructing stones present within the bilateral kidneys measuring up to 5 mm on the  right and 3 mm on the left. Multiple sub cm cortical hypodensities, too small to further characterize. The urinary bladder is grossly unremarkable. Stomach/Bowel: There is small amount of radiopaque material in the stomach. There is no dilated small bowel to suggest an obstruction. There is no colon wall thickening. The appendix is visualized and is normal. Vascular/Lymphatic: Atherosclerosis of the aorta. No significantly enlarged abdominal or pelvic  lymph nodes. Reproductive: Status post hysterectomy. No adnexal masses. Other: There is no free air or free fluid. Musculoskeletal: There are degenerative changes of the spine. No acute osseous abnormality. IMPRESSION: 1. There are nonobstructing stones present within the bilateral kidneys. 2. Gas containing stones within the gallbladder. No CT evidence for gallbladder inflammation or biliary dilatation. 3. Multiple sub cm renal lesions, too small to further characterize. 4. No CT evidence for acute appendicitis. Electronically Signed   By: Donavan Foil M.D.   On: 06/06/2016 23:35    Procedures Procedures (including critical care time)  Medications Ordered in ED Medications  fentaNYL (SUBLIMAZE) injection 100 mcg (100 mcg Intravenous Given 05/29/2016 2028)  ondansetron (ZOFRAN) injection 4 mg (4 mg Intravenous Given 06/03/2016 2028)  iopamidol (ISOVUE-300) 61 % injection (100 mLs  Contrast Given 06/18/2016 2253)  fentaNYL (SUBLIMAZE) injection 50 mcg (50 mcg Intravenous Given 05/29/2016 2224)     Initial Impression / Assessment and Plan / ED Course  I have reviewed the triage vital signs and the nursing notes.  Pertinent labs & imaging results that were available during my care of the patient were reviewed by me and considered in my medical decision making (see chart for details).  Clinical Course as of Jun 25 2349  Thu Jun 25, 2016  1914 Zofran, fentanyl, labs, CT. Non focal abd exam, mostly periumbilical  [SG]  0000000 Pain is better but now starting to  get a little worse. Labs reassuring. No more vomiting. Waiting on CT. Will give more fentanyl  [SG]    Clinical Course User Index [SG] Sherwood Gambler, MD    Patient is completely asymptomatic at this time. Lab work is benign. No urinary symptoms, doubt UTI. Likely a GI virus. Her pain has been umbilical and lower. No upper abdominal pain and so I think this is cholelithiasis that is not symptomatic or cholecystitis. Follow-up with PCP. Discharge with Zofran and Norco. Strict return precautions.  Final Clinical Impressions(s) / ED Diagnoses   Final diagnoses:  Nausea and vomiting in adult  Periumbilical abdominal pain  Calculus of gallbladder without cholecystitis without obstruction    New Prescriptions New Prescriptions   HYDROCODONE-ACETAMINOPHEN (NORCO) 5-325 MG TABLET    Take 1-2 tablets by mouth every 4 (four) hours as needed.   ONDANSETRON (ZOFRAN ODT) 4 MG DISINTEGRATING TABLET    Take 1 tablet (4 mg total) by mouth every 8 (eight) hours as needed for nausea or vomiting.     Sherwood Gambler, MD 05/31/2016 [id number]

## 2016-06-26 LAB — CBG MONITORING, ED: GLUCOSE-CAPILLARY: 188 mg/dL — AB (ref 65–99)

## 2016-06-26 MED ORDER — SODIUM BICARBONATE 8.4 % IV SOLN
INTRAVENOUS | Status: AC
  Filled 2016-06-26: qty 150

## 2016-06-26 MED ORDER — CALCIUM CHLORIDE 10 % IV SOLN
INTRAVENOUS | Status: AC | PRN
  Administered 2016-06-26: 1 g via INTRAVENOUS

## 2016-06-26 MED ORDER — NALOXONE HCL 2 MG/2ML IJ SOSY: 2.0000 mg | PREFILLED_SYRINGE | Freq: Once | INTRAMUSCULAR | Status: DC

## 2016-06-26 MED ORDER — EPINEPHRINE PF 1 MG/10ML IJ SOSY
PREFILLED_SYRINGE | INTRAMUSCULAR | Status: AC | PRN
  Administered 2016-06-26 (×6): 1 mg via INTRAVENOUS

## 2016-06-26 MED ORDER — SODIUM CHLORIDE 0.9 % IV SOLN
INTRAVENOUS | Status: AC | PRN
  Administered 2016-06-26: 1000 mL via INTRAVENOUS

## 2016-06-26 MED ORDER — SODIUM BICARBONATE 8.4 % IV SOLN
INTRAVENOUS | Status: AC | PRN
Start: 2016-06-26 — End: 2016-06-26
  Administered 2016-06-26 (×2): 100 meq via INTRAVENOUS
  Administered 2016-06-26: 50 meq via INTRAVENOUS

## 2016-06-26 MED ORDER — EPINEPHRINE PF 1 MG/10ML IJ SOSY
PREFILLED_SYRINGE | INTRAMUSCULAR | Status: AC
  Filled 2016-06-26: qty 30

## 2016-06-26 MED ORDER — NALOXONE HCL 2 MG/2ML IJ SOSY
PREFILLED_SYRINGE | INTRAMUSCULAR | Status: AC
  Filled 2016-06-26: qty 2

## 2016-06-26 MED ORDER — NALOXONE HCL 2 MG/2ML IJ SOSY
PREFILLED_SYRINGE | INTRAMUSCULAR | Status: AC | PRN
  Administered 2016-06-26: 2 mg via INTRAVENOUS

## 2016-06-26 MED FILL — Medication: Qty: 1 | Status: AC

## 2016-06-26 DEATH — deceased

## 2016-07-27 NOTE — ED Notes (Addendum)
Pt's family at bedside, AC rounding.

## 2016-07-27 NOTE — Code Documentation (Signed)
Family at bedside; updated as to patient's status;

## 2016-07-27 NOTE — ED Notes (Signed)
Pt arrives to D34 unresponsive; assisted ventilations with BVM; [place] at bedside

## 2016-07-27 NOTE — Code Documentation (Signed)
Preparing to intubate.  

## 2016-07-27 NOTE — Code Documentation (Signed)
Patient time of death occurred at 46 .

## 2016-07-27 NOTE — ED Notes (Addendum)
Pt A&O x4 at discharge. Pt d/c home with family via wheel chair. NAD. VSS.

## 2016-07-27 NOTE — Progress Notes (Signed)
   07-04-2016 0100  Clinical Encounter Type  Visited With Patient and family together  Visit Type Death  Referral From Nurse  Stress Factors  Patient Stress Factors None identified  Family Stress Factors Loss    Chaplain responded to page in ed. Pt death family at bedside. Provided ministry of support, prayer, and presence.

## 2016-07-27 NOTE — ED Notes (Signed)
Family remains at bedside.

## 2016-07-27 NOTE — ED Notes (Addendum)
Pt transferred into car with standby assistance by me. Once in car this RN returned to ED entrance. PT family then notified this RN pt had "passed out". I notified tech first that I needed assistance and returned to pt. Pt not responsive to painful stimuli at this time. Faint carotid pulse palpated by this RN. Pt gaze fixed and pupils dilated. Skin pale and diaphoretic. PT then transferred to stretcher from vehicle with assistance and roomed to [place].

## 2016-07-27 NOTE — ED Notes (Addendum)
Family at bedside taking home patients necklace and charm

## 2016-07-27 NOTE — ED Provider Notes (Signed)
[facility] DEPT Provider Note   CSN: [id number] Arrival date & time: July 11, 2016 [id number]  By signing my name below, I, Dolores Hoose, attest that this documentation has been prepared under the direction and in the presence of Ezequiel Essex, MD . Electronically Signed: Dolores Hoose, Scribe. 11-Jul-2016. 1:14 AM.  History   Chief Complaint Chief Complaint  Patient presents with  . Emesis   LEVEL 5 CAVEAT DUE TO UNRESPONSIVENESS  The history is provided by medical records. No language interpreter was used.    HPI Comments:  Tykeria Gnau is a 66 y.o. female with pmhx of DM and CHF who presents to the Emergency Department in cardiac arrest having recently been discharged. Pt was complaining of umbilical and lower abdominal pain and vomiting earlier beginning 9AM on 06/02/2016. Per ED tech, Pt made it to the car after being discharged before her family started shouting that she had lost consciousness. She was discharged earlier with Zofran and Norco.    Patient arrives unresponsive, apneic, pulseless. CPR begun.    Past Medical History:  Diagnosis Date  . Back pain, chronic   . CHF (congestive heart failure) ([place])   . Diabetes mellitus   . Edema   . High cholesterol   . Hypertension   . Obesity   . OSA on CPAP   . Renal disorder   . Stroke ([place])   . Thyroid disease     Patient Active Problem List   Diagnosis Date Noted  . Controlled type 2 diabetes mellitus with circulatory disorder ([place]) 10/30/2015  . Cerebral infarction due to embolism of left middle cerebral artery ([place]) 10/30/2015  . Encounter for loop recorder check 09/07/2015  . Essential hypertension 09/18/2014  . Seizure ([place]) 07/12/2014  . History of CVA with residual deficit 05/30/2014  . Seizures ([place]) 05/11/2014  . CVA (cerebral infarction) 05/08/2014  . Diabetes mellitus type 2, controlled ([place]) 05/08/2014  . Hypothyroidism 05/08/2014  . Hypercholesteremia 11/29/2013  . Status post laparoscopy  11/01/2013  . OSA on CPAP 10/31/2013  . HTN (hypertension) 10/31/2013  . Morbid obesity ([place]) 10/31/2013  . Leg edema 10/31/2013  . Ovarian cyst 10/31/2013  . Preoperative cardiovascular examination 10/31/2013  . Pelvic mass 10/31/2013  . DM type 2 (diabetes mellitus, type 2) ([place]) 10/31/2013  . History of CHF (congestive heart failure) 10/31/2013  . Acute renal insufficiency 10/31/2013  . Unspecified hypothyroidism 10/31/2013    Past Surgical History:  Procedure Laterality Date  . ABDOMINAL HYSTERECTOMY    . BUNIONECTOMY     both feet  . EYE SURGERY    . LAPAROSCOPIC LYSIS OF ADHESIONS N/A 11/01/2013   Procedure: LAPAROSCOPIC LYSIS OF ADHESIONS;  Surgeon: Ena Dawley, MD;  Location: [facility];  Service: Gynecology;  Laterality: N/A;  . LAPAROSCOPIC OVARIAN CYSTECTOMY Right 11/01/2013   Procedure: LAPAROSCOPIC RIGHT OVARIAN CYSTECTOMY WITH COLLECTION OF WASHINGS ;  Surgeon: Ena Dawley, MD;  Location: [facility];  Service: Gynecology;  Laterality: Right;  . LAPAROSCOPY N/A 11/01/2013   Procedure: LAPAROSCOPY DIAGNOSTIC;  Surgeon: Ena Dawley, MD;  Location: [facility];  Service: Gynecology;  Laterality: N/A;  . left knee surgery     Torn meniscus  . LOOP RECORDER IMPLANT N/A 05/11/2014   Procedure: LOOP RECORDER IMPLANT;  Surgeon: Coralyn Mark, MD;  Location: [facility];  Service: Cardiovascular;  Laterality: N/A;  . NEPHROLITHOTOMY     Right  . TEE WITHOUT CARDIOVERSION N/A 05/11/2014   Procedure: TRANSESOPHAGEAL ECHOCARDIOGRAM (TEE);  Surgeon: Sanda Klein, MD;  Location: [facility]COPY;  Service: Cardiovascular;  Laterality: N/A;  . US ECHOCARDIOGRAPHY  12/24/2011   mild LVH,mild TR,trace MR,PI    OB History    No data available       Home Medications    Prior to Admission medications   Medication Sig Start Date End Date Taking? Authorizing Provider  allopurinol (ZYLOPRIM) 100 MG tablet Take 100 mg by mouth daily.  05/21/14  Yes Historical Provider, MD  buPROPion  (WELLBUTRIN) 100 MG tablet Take 100 mg by mouth 3 (three) times daily.   Yes Historical Provider, MD  clopidogrel (PLAVIX) 75 MG tablet Take 1 tablet (75 mg total) by mouth daily. 09/18/14  Yes Rosalin Hawking, MD  cycloSPORINE (RESTASIS) 0.05 % ophthalmic emulsion Place 1 drop into both eyes 2 (two) times daily.   Yes Historical Provider, MD  escitalopram (LEXAPRO) 5 MG tablet Take 5 mg by mouth daily.  04/26/14  Yes Historical Provider, MD  ezetimibe (ZETIA) 10 MG tablet Take 10 mg by mouth daily.   Yes Historical Provider, MD  ferrous sulfate 325 (65 FE) MG tablet Take 325 mg by mouth daily with breakfast.   Yes Historical Provider, MD  furosemide (LASIX) 40 MG tablet Take 40 mg by mouth daily as needed.   Yes Historical Provider, MD  gabapentin (NEURONTIN) 600 MG tablet Take 600 mg by mouth at bedtime.   Yes Historical Provider, MD  glyBURIDE (DIABETA) 5 MG tablet Take 5 mg by mouth daily with supper.   Yes Historical Provider, MD  levothyroxine (SYNTHROID, LEVOTHROID) 75 MCG tablet Take 75 mcg by mouth daily.   Yes Historical Provider, MD  meloxicam (MOBIC) 15 MG tablet Take 15 mg by mouth daily.   Yes Historical Provider, MD  mupirocin cream (BACTROBAN) 2 % Apply topically 2 (two) times daily. Patient taking differently: Apply 1 application topically 2 (two) times daily as needed (redness/irritation).  05/11/14  Yes Kelvin Cellar, MD  nebivolol (BYSTOLIC) 5 MG tablet Take 5 mg by mouth daily.   Yes Historical Provider, MD  pantoprazole (PROTONIX) 40 MG tablet Take 40 mg by mouth daily.   Yes Historical Provider, MD  temazepam (RESTORIL) 30 MG capsule Take 30 mg by mouth at bedtime as needed for sleep.   Yes Historical Provider, MD  Vitamin D, Ergocalciferol, (DRISDOL) 50000 UNITS CAPS Take 50,000 Units by mouth 2 (two) times a week. Mondays and fridays   Yes Historical Provider, MD  [facility] 3.75 G PACK Take 3.75 g by mouth daily.  05/04/14  Yes Historical Provider, MD  HYDROcodone-acetaminophen  (NORCO) 5-325 MG tablet Take 1-2 tablets by mouth every 4 (four) hours as needed. 06/03/2016   Sherwood Gambler, MD  ondansetron (ZOFRAN ODT) 4 MG disintegrating tablet Take 1 tablet (4 mg total) by mouth every 8 (eight) hours as needed for nausea or vomiting. 05/28/2016   Sherwood Gambler, MD    Family History Family History  Problem Relation Age of Onset  . Diabetes Mother   . Hyperlipidemia Mother   . Hypertension Mother   . Stroke Mother   . Hypertension Father   . Diabetes Father   . Cancer Father   . Heart attack Brother   . Hyperlipidemia Brother   . Hypertension Brother     Social History Social History  Substance Use Topics  . Smoking status: Former Smoker    Quit date: 07/26/2002  . Smokeless tobacco: Never Used  . Alcohol use No     Allergies   Crestor [rosuvastatin] and Lipitor [atorvastatin calcium]  Review of Systems Review of Systems  Unable to perform ROS: Patient unresponsive     Physical Exam Updated Vital Signs BP (!) 0/0   Pulse (!) 0   Temp 98.9 F (37.2 C) (Oral)   Resp 26   SpO2 95%   Physical Exam  Constitutional: She appears distressed.  Pale and Unresponsive  HENT:  Head: Normocephalic and atraumatic.  Eyes:  Left pupil 26mm and nonresponsive. Right pupil is artificial.  Cardiovascular:  Pulses palpable with compression  Pulmonary/Chest:  Respiration assisted with bagging  Abdominal: There is no tenderness.  Neurological:  No spontaneous movement. GCS 3. Mottled.  Nursing note and vitals reviewed.    ED Treatments / Results   1:10 AM: Time of Death  1:39 AM: Consulted with Dr. Ardeth Perfect who asked medical examiner to be notified.  Labs (all labs ordered are listed, but only abnormal results are displayed) Labs Reviewed  COMPREHENSIVE METABOLIC PANEL - Abnormal; Notable for the following:       Result Value   Glucose, Bld 215 (*)    AST 105 (*)    All other components within normal limits  CBC WITH DIFFERENTIAL/PLATELET -  Abnormal; Notable for the following:    Neutro Abs 8.9 (*)    All other components within normal limits  URINALYSIS, ROUTINE W REFLEX MICROSCOPIC (NOT AT [facility]) - Abnormal; Notable for the following:    Color, Urine AMBER (*)    APPearance CLOUDY (*)    Glucose, UA 250 (*)    Hgb urine dipstick TRACE (*)    Ketones, ur 15 (*)    Protein, ur 30 (*)    All other components within normal limits  URINE MICROSCOPIC-ADD ON - Abnormal; Notable for the following:    Squamous Epithelial / LPF 6-30 (*)    Bacteria, UA FEW (*)    Casts HYALINE CASTS (*)    All other components within normal limits  CBG MONITORING, ED - Abnormal; Notable for the following:    Glucose-Capillary 193 (*)    All other components within normal limits  LIPASE, BLOOD    EKG  EKG Interpretation None       Radiology Ct Abdomen Pelvis W Contrast  Result Date: 06/18/2016 CLINICAL DATA:  Abdominal pain with vomiting EXAM: CT ABDOMEN AND PELVIS WITH CONTRAST TECHNIQUE: Multidetector CT imaging of the abdomen and pelvis was performed using the standard protocol following bolus administration of intravenous contrast. CONTRAST:  145mL ISOVUE-300 IOPAMIDOL (ISOVUE-300) INJECTION 61% COMPARISON:  05/09/2014 FINDINGS: Lower chest: Patchy dependent atelectasis at the posterior lung bases. No pleural effusion. Heart size nonenlarged. Hepatobiliary: No focal hepatic abnormality is seen. Gas containing stones present within the gallbladder. No wall thickening. No biliary dilatation. Pancreas: Unremarkable. No pancreatic ductal dilatation or surrounding inflammatory changes. Spleen: Normal in size without focal abnormality. Adrenals/Urinary Tract: Adrenal glands are within normal limits. There are nonobstructing stones present within the bilateral kidneys measuring up to 5 mm on the right and 3 mm on the left. Multiple sub cm cortical hypodensities, too small to further characterize. The urinary bladder is grossly unremarkable.  Stomach/Bowel: There is small amount of radiopaque material in the stomach. There is no dilated small bowel to suggest an obstruction. There is no colon wall thickening. The appendix is visualized and is normal. Vascular/Lymphatic: Atherosclerosis of the aorta. No significantly enlarged abdominal or pelvic lymph nodes. Reproductive: Status post hysterectomy. No adnexal masses. Other: There is no free air or free fluid. Musculoskeletal: There are degenerative changes of the  spine. No acute osseous abnormality. IMPRESSION: 1. There are nonobstructing stones present within the bilateral kidneys. 2. Gas containing stones within the gallbladder. No CT evidence for gallbladder inflammation or biliary dilatation. 3. Multiple sub cm renal lesions, too small to further characterize. 4. No CT evidence for acute appendicitis. Electronically Signed   By: Donavan Foil M.D.   On: 06/03/2016 23:35    Procedures Procedure Name: Intubation Date/Time: 07-03-2016 12:45 PM Performed by: Ezequiel Essex Pre-anesthesia Checklist: Emergency Drugs available, Patient identified, Patient being monitored and Suction available Oxygen Delivery Method: Ambu bag Preoxygenation: Pre-oxygenation with 100% oxygen Intubation Type: Rapid sequence Ventilation: Mask ventilation without difficulty Laryngoscope Size: Mac, Glidescope and 4 Grade View: Grade II Tube type: Subglottic suction tube Tube size: 7.5 mm Number of attempts: 1 Airway Equipment and Method: Bougie stylet,  Rigid stylet and Video-laryngoscopy Placement Confirmation: ETT inserted through vocal cords under direct vision and Positive ETCO2 Secured at: 24 cm Tube secured with: ETT holder Dental Injury: Teeth and Oropharynx as per pre-operative assessment  Future Recommendations: Recommend- induction with short-acting agent, and alternative techniques readily available      (including critical care time)  Medications Ordered in ED Medications  naloxone  (NARCAN) injection 2 mg (not administered)  naloxone (NARCAN) 2 MG/2ML injection (not administered)  EPINEPHrine (ADRENALIN) 1 MG/10ML injection (not administered)  sodium bicarbonate 1 mEq/mL injection (not administered)  fentaNYL (SUBLIMAZE) injection 100 mcg (100 mcg Intravenous Given 06/08/2016 2028)  ondansetron (ZOFRAN) injection 4 mg (4 mg Intravenous Given 06/14/2016 2028)  iopamidol (ISOVUE-300) 61 % injection (100 mLs  Contrast Given 06/15/2016 2253)  fentaNYL (SUBLIMAZE) injection 50 mcg (50 mcg Intravenous Given 06/13/2016 2224)  EPINEPHrine (ADRENALIN) 1 MG/10ML injection (1 mg Intravenous Given Jul 03, 2016 0105)  naloxone (NARCAN) injection (2 mg Intravenous Given 07/03/16 0046)  0.9 %  sodium chloride infusion (1,000 mLs Intravenous New Bag/Given July 03, 2016 0048)  sodium bicarbonate injection (100 mEq Intravenous Given 07-03-2016 0107)  calcium chloride injection (1 g Intravenous Given 2016-07-03 0056)     Initial Impression / Assessment and Plan / ED Course  I have reviewed the triage vital signs and the nursing notes.  Pertinent labs & imaging results that were available during my care of the patient were reviewed by me and considered in my medical decision making (see chart for details).  Patient brought in from parking lot with unresponsiveness. Recently discharged from emergency department. Patient is apneic, unresponsive and has no pulse. CPR begun on arrival. Initial rhythm PEA.  Patient given high quality chest compressions, IV narcan, epinephrine, bicarbonate and calcium. CBG 188.  Did have some wide complex and bradycardia. IV calcium given.  K was normal on earlier labs. IVF opened.  Patient unresponsive and being bagged by respiratory therapy. Patient intubated as above.  CPR continued for 25 minutes without change in rhythm. Did have one episode of ventricular fibrillation and was shocked 1. Otherwise rhythm was PEA with unresponsiveness to epinephrine and additional measures. No  right heart strain seen on ultrasound. No cardiac activity seen on [place]. RV collapsed.   Family at bedside. They understand the gravity of illness and the fact the patient will likely not recover. They agree with stopping resuscitative efforts. Death pronounced at 1:10 AM.  D/w Dr. Ardeth Perfect who accepts death certificate for Dr. Dagmar Hait.  D/w Cora Collum of [place] office who declines to take case.   Family updated and consoled.   Cardiopulmonary Resuscitation (CPR) Procedure Note Directed/Performed by: Ezequiel Essex I personally directed ancillary staff and/or performed  CPR in an effort to regain return of spontaneous circulation and to maintain cardiac, neuro and systemic perfusion.   CRITICAL CARE Performed by: Ezequiel Essex Total critical care time: 45 minutes Critical care time was exclusive of separately billable procedures and treating other patients. Critical care was necessary to treat or prevent imminent or life-threatening deterioration. Critical care was time spent personally by me on the following activities: development of treatment plan with patient and/or surrogate as well as nursing, discussions with consultants, evaluation of patient's response to treatment, examination of patient, obtaining history from patient or surrogate, ordering and performing treatments and interventions, ordering and review of laboratory studies, ordering and review of radiographic studies, pulse oximetry and re-evaluation of patient's condition.  Final Clinical Impressions(s) / ED Diagnoses   Final diagnoses:  Nausea and vomiting in adult  Periumbilical abdominal pain  Calculus of gallbladder without cholecystitis without obstruction  Cardiac arrest ([place])    New Prescriptions New Prescriptions   HYDROCODONE-ACETAMINOPHEN (NORCO) 5-325 MG TABLET    Take 1-2 tablets by mouth every 4 (four) hours as needed.   ONDANSETRON (ZOFRAN ODT) 4 MG DISINTEGRATING TABLET    Take 1 tablet (4 mg total) by  mouth every 8 (eight) hours as needed for nausea or vomiting.  I personally performed the services described in this documentation, which was scribed in my presence. The recorded information has been reviewed and is accurate.    Ezequiel Essex, MD 2016/06/28 [phone]

## 2016-07-27 DEATH — deceased

## 2016-10-29 ENCOUNTER — Ambulatory Visit: Payer: Self-pay | Admitting: Neurology
